# Patient Record
Sex: Female | Born: 1965 | Race: White | Hispanic: No | Marital: Single | Smoking: Never smoker
Health system: Southern US, Community
[De-identification: ages and names within clinical notes are randomized; demographics above are authoritative.]

## PROBLEM LIST (undated history)

## (undated) DIAGNOSIS — K219 Gastro-esophageal reflux disease without esophagitis: Secondary | ICD-10-CM

## (undated) DIAGNOSIS — E669 Obesity, unspecified: Secondary | ICD-10-CM

## (undated) DIAGNOSIS — I10 Essential (primary) hypertension: Secondary | ICD-10-CM

## (undated) DIAGNOSIS — C801 Malignant (primary) neoplasm, unspecified: Secondary | ICD-10-CM

## (undated) DIAGNOSIS — E785 Hyperlipidemia, unspecified: Secondary | ICD-10-CM

## (undated) DIAGNOSIS — K760 Fatty (change of) liver, not elsewhere classified: Secondary | ICD-10-CM

## (undated) DIAGNOSIS — E119 Type 2 diabetes mellitus without complications: Secondary | ICD-10-CM

## (undated) DIAGNOSIS — E7800 Pure hypercholesterolemia, unspecified: Secondary | ICD-10-CM

## (undated) DIAGNOSIS — G5603 Carpal tunnel syndrome, bilateral upper limbs: Secondary | ICD-10-CM

## (undated) DIAGNOSIS — R000 Tachycardia, unspecified: Secondary | ICD-10-CM

## (undated) DIAGNOSIS — I2699 Other pulmonary embolism without acute cor pulmonale: Secondary | ICD-10-CM

## (undated) DIAGNOSIS — G43909 Migraine, unspecified, not intractable, without status migrainosus: Secondary | ICD-10-CM

## (undated) DIAGNOSIS — Z9289 Personal history of other medical treatment: Secondary | ICD-10-CM

## (undated) DIAGNOSIS — T4145XA Adverse effect of unspecified anesthetic, initial encounter: Secondary | ICD-10-CM

## (undated) DIAGNOSIS — F419 Anxiety disorder, unspecified: Secondary | ICD-10-CM

## (undated) DIAGNOSIS — T8859XA Other complications of anesthesia, initial encounter: Secondary | ICD-10-CM

## (undated) HISTORY — DX: Gastro-esophageal reflux disease without esophagitis: K21.9

## (undated) HISTORY — DX: Pure hypercholesterolemia, unspecified: E78.00

## (undated) HISTORY — DX: Carpal tunnel syndrome, bilateral upper limbs: G56.03

## (undated) HISTORY — PX: CARPAL TUNNEL RELEASE: SHX101

## (undated) HISTORY — DX: Personal history of other medical treatment: Z92.89

## (undated) HISTORY — DX: Hyperlipidemia, unspecified: E78.5

## (undated) HISTORY — DX: Essential (primary) hypertension: I10

## (undated) HISTORY — DX: Anxiety disorder, unspecified: F41.9

## (undated) HISTORY — PX: LAPAROSCOPIC HYSTERECTOMY: SHX1926

## (undated) HISTORY — DX: Type 2 diabetes mellitus without complications: E11.9

## (undated) HISTORY — DX: Malignant (primary) neoplasm, unspecified: C80.1

## (undated) HISTORY — DX: Obesity, unspecified: E66.9

## (undated) HISTORY — DX: Fatty (change of) liver, not elsewhere classified: K76.0

## (undated) HISTORY — PX: OTHER SURGICAL HISTORY: SHX169

## (undated) HISTORY — PX: CARDIAC CATHETERIZATION: SHX172

## (undated) HISTORY — DX: Migraine, unspecified, not intractable, without status migrainosus: G43.909

## (undated) SURGERY — Surgical Case
Anesthesia: *Unknown

---

## 1998-09-05 ENCOUNTER — Other Ambulatory Visit: Admission: RE | Admit: 1998-09-05 | Discharge: 1998-09-05 | Payer: Self-pay | Admitting: Obstetrics and Gynecology

## 1999-09-13 ENCOUNTER — Other Ambulatory Visit: Admission: RE | Admit: 1999-09-13 | Discharge: 1999-09-13 | Payer: Self-pay | Admitting: Obstetrics and Gynecology

## 2000-04-14 DIAGNOSIS — I2699 Other pulmonary embolism without acute cor pulmonale: Secondary | ICD-10-CM

## 2000-04-14 HISTORY — DX: Other pulmonary embolism without acute cor pulmonale: I26.99

## 2000-09-15 ENCOUNTER — Encounter: Payer: Self-pay | Admitting: *Deleted

## 2000-09-15 ENCOUNTER — Inpatient Hospital Stay (HOSPITAL_COMMUNITY): Admission: EM | Admit: 2000-09-15 | Discharge: 2000-09-18 | Payer: Self-pay | Admitting: *Deleted

## 2000-09-18 ENCOUNTER — Encounter (INDEPENDENT_AMBULATORY_CARE_PROVIDER_SITE_OTHER): Payer: Self-pay | Admitting: *Deleted

## 2000-09-18 ENCOUNTER — Encounter: Payer: Self-pay | Admitting: Internal Medicine

## 2000-09-30 ENCOUNTER — Encounter: Payer: Self-pay | Admitting: Internal Medicine

## 2000-12-07 ENCOUNTER — Other Ambulatory Visit: Admission: RE | Admit: 2000-12-07 | Discharge: 2000-12-07 | Payer: Self-pay | Admitting: Obstetrics and Gynecology

## 2001-03-14 ENCOUNTER — Encounter: Payer: Self-pay | Admitting: Emergency Medicine

## 2001-03-14 ENCOUNTER — Emergency Department (HOSPITAL_COMMUNITY): Admission: EM | Admit: 2001-03-14 | Discharge: 2001-03-14 | Payer: Self-pay | Admitting: Emergency Medicine

## 2001-11-15 ENCOUNTER — Encounter: Admission: RE | Admit: 2001-11-15 | Discharge: 2002-01-25 | Payer: Self-pay | Admitting: Internal Medicine

## 2001-12-28 ENCOUNTER — Other Ambulatory Visit: Admission: RE | Admit: 2001-12-28 | Discharge: 2001-12-28 | Payer: Self-pay | Admitting: Obstetrics and Gynecology

## 2003-01-03 ENCOUNTER — Other Ambulatory Visit: Admission: RE | Admit: 2003-01-03 | Discharge: 2003-01-03 | Payer: Self-pay | Admitting: Obstetrics and Gynecology

## 2003-05-26 ENCOUNTER — Inpatient Hospital Stay (HOSPITAL_BASED_OUTPATIENT_CLINIC_OR_DEPARTMENT_OTHER): Admission: RE | Admit: 2003-05-26 | Discharge: 2003-05-26 | Payer: Self-pay | Admitting: Cardiology

## 2003-07-13 ENCOUNTER — Encounter (INDEPENDENT_AMBULATORY_CARE_PROVIDER_SITE_OTHER): Payer: Self-pay | Admitting: *Deleted

## 2003-07-13 ENCOUNTER — Encounter: Admission: RE | Admit: 2003-07-13 | Discharge: 2003-07-13 | Payer: Self-pay | Admitting: Internal Medicine

## 2004-04-09 ENCOUNTER — Other Ambulatory Visit: Admission: RE | Admit: 2004-04-09 | Discharge: 2004-04-09 | Payer: Self-pay | Admitting: Obstetrics and Gynecology

## 2004-05-24 ENCOUNTER — Ambulatory Visit (HOSPITAL_BASED_OUTPATIENT_CLINIC_OR_DEPARTMENT_OTHER): Admission: RE | Admit: 2004-05-24 | Discharge: 2004-05-24 | Payer: Self-pay | Admitting: Orthopedic Surgery

## 2004-05-24 ENCOUNTER — Ambulatory Visit (HOSPITAL_COMMUNITY): Admission: RE | Admit: 2004-05-24 | Discharge: 2004-05-24 | Payer: Self-pay | Admitting: Orthopedic Surgery

## 2005-02-06 ENCOUNTER — Ambulatory Visit (HOSPITAL_BASED_OUTPATIENT_CLINIC_OR_DEPARTMENT_OTHER): Admission: RE | Admit: 2005-02-06 | Discharge: 2005-02-06 | Payer: Self-pay | Admitting: Otolaryngology

## 2005-02-09 ENCOUNTER — Ambulatory Visit: Payer: Self-pay | Admitting: Internal Medicine

## 2005-02-19 ENCOUNTER — Ambulatory Visit: Payer: Self-pay | Admitting: Internal Medicine

## 2005-04-18 ENCOUNTER — Other Ambulatory Visit: Admission: RE | Admit: 2005-04-18 | Discharge: 2005-04-18 | Payer: Self-pay | Admitting: Obstetrics and Gynecology

## 2005-10-26 ENCOUNTER — Inpatient Hospital Stay (HOSPITAL_COMMUNITY): Admission: EM | Admit: 2005-10-26 | Discharge: 2005-10-27 | Payer: Self-pay | Admitting: Emergency Medicine

## 2007-04-01 ENCOUNTER — Encounter: Admission: RE | Admit: 2007-04-01 | Discharge: 2007-04-01 | Payer: Self-pay | Admitting: Internal Medicine

## 2007-04-01 ENCOUNTER — Encounter (INDEPENDENT_AMBULATORY_CARE_PROVIDER_SITE_OTHER): Payer: Self-pay | Admitting: *Deleted

## 2007-09-08 ENCOUNTER — Ambulatory Visit: Payer: Self-pay | Admitting: Internal Medicine

## 2007-09-09 ENCOUNTER — Ambulatory Visit: Payer: Self-pay | Admitting: Internal Medicine

## 2007-10-18 ENCOUNTER — Ambulatory Visit: Payer: Self-pay | Admitting: Internal Medicine

## 2007-12-29 ENCOUNTER — Encounter (INDEPENDENT_AMBULATORY_CARE_PROVIDER_SITE_OTHER): Payer: Self-pay | Admitting: Obstetrics and Gynecology

## 2007-12-29 ENCOUNTER — Ambulatory Visit (HOSPITAL_COMMUNITY): Admission: RE | Admit: 2007-12-29 | Discharge: 2007-12-29 | Payer: Self-pay | Admitting: Obstetrics and Gynecology

## 2008-01-12 ENCOUNTER — Ambulatory Visit: Payer: Self-pay | Admitting: Internal Medicine

## 2009-02-08 ENCOUNTER — Ambulatory Visit: Payer: Self-pay | Admitting: Internal Medicine

## 2009-02-08 DIAGNOSIS — I10 Essential (primary) hypertension: Secondary | ICD-10-CM

## 2009-02-08 DIAGNOSIS — E7800 Pure hypercholesterolemia, unspecified: Secondary | ICD-10-CM

## 2009-02-08 DIAGNOSIS — I479 Paroxysmal tachycardia, unspecified: Secondary | ICD-10-CM

## 2009-06-18 ENCOUNTER — Encounter: Admission: RE | Admit: 2009-06-18 | Discharge: 2009-06-18 | Payer: Self-pay | Admitting: Internal Medicine

## 2009-06-18 ENCOUNTER — Encounter (INDEPENDENT_AMBULATORY_CARE_PROVIDER_SITE_OTHER): Payer: Self-pay | Admitting: *Deleted

## 2009-06-19 ENCOUNTER — Telehealth: Payer: Self-pay | Admitting: Internal Medicine

## 2009-06-20 ENCOUNTER — Encounter: Payer: Self-pay | Admitting: Internal Medicine

## 2009-07-09 ENCOUNTER — Ambulatory Visit: Payer: Self-pay | Admitting: Internal Medicine

## 2009-07-09 DIAGNOSIS — K219 Gastro-esophageal reflux disease without esophagitis: Secondary | ICD-10-CM | POA: Insufficient documentation

## 2009-07-09 DIAGNOSIS — E109 Type 1 diabetes mellitus without complications: Secondary | ICD-10-CM | POA: Insufficient documentation

## 2009-07-09 DIAGNOSIS — R1013 Epigastric pain: Secondary | ICD-10-CM

## 2009-07-18 ENCOUNTER — Ambulatory Visit: Payer: Self-pay | Admitting: Internal Medicine

## 2009-07-19 ENCOUNTER — Ambulatory Visit (HOSPITAL_COMMUNITY): Admission: RE | Admit: 2009-07-19 | Discharge: 2009-07-19 | Payer: Self-pay | Admitting: Internal Medicine

## 2009-07-19 ENCOUNTER — Ambulatory Visit: Payer: Self-pay | Admitting: Internal Medicine

## 2009-12-18 ENCOUNTER — Ambulatory Visit (HOSPITAL_COMMUNITY): Admission: RE | Admit: 2009-12-18 | Discharge: 2009-12-22 | Payer: Self-pay | Admitting: Obstetrics and Gynecology

## 2009-12-18 ENCOUNTER — Encounter (INDEPENDENT_AMBULATORY_CARE_PROVIDER_SITE_OTHER): Payer: Self-pay | Admitting: Obstetrics and Gynecology

## 2009-12-18 ENCOUNTER — Ambulatory Visit: Payer: Self-pay | Admitting: Emergency Medicine

## 2009-12-20 ENCOUNTER — Encounter: Payer: Self-pay | Admitting: Emergency Medicine

## 2009-12-20 ENCOUNTER — Ambulatory Visit: Payer: Self-pay | Admitting: Cardiology

## 2010-01-21 ENCOUNTER — Ambulatory Visit: Payer: Self-pay | Admitting: Emergency Medicine

## 2010-01-21 DIAGNOSIS — R0602 Shortness of breath: Secondary | ICD-10-CM

## 2010-04-14 HISTORY — PX: LAPAROSCOPIC HYSTERECTOMY: SHX1926

## 2010-05-05 ENCOUNTER — Encounter: Payer: Self-pay | Admitting: Internal Medicine

## 2010-05-16 NOTE — Assessment & Plan Note (Signed)
Summary: 6 month rov/sl    Visit Type:  Follow-up Referring Provider:  Guerry Bruin, MD  Primary Provider:  Guerry Bruin, MD    History of Present Illness: Jocelyn [name] returns today for followup.  She is a very pleasant young woman with diabetes, inappropriate sinus tachycardia, hypertension, and dizziness. The etiology of her dizziness is unclear but she notes now that it only occurs when she drives her car on busy streets or on the highway.  She denies c/p or sob but does still feel intermittant palpitations associated with chest tightness.  No peripheral edema.  Since I saw her last she has been well.    Current Medications (verified): 1)  Altace 2.5 Mg Caps (Ramipril) .... Once Daily 2)  Protonix 40 Mg Tbec (Pantoprazole Sodium) .... Take 1 By Mouth Once Daily 3)  Toprol Xl 25 Mg Xr24h-Tab (Metoprolol Succinate) .... Take 1 By Mouth Twice Daily 4)  Zoloft 100 Mg Tabs (Sertraline Hcl) .... Take 1 By Mouth Once Daily 5)  Vytorin 10-80 Mg Tabs (Ezetimibe-Simvastatin) .... Take One Tablet By Mouth Daily At Bed Time 6)  Aspirin 81 Mg Tbec (Aspirin) .... Take One Tablet By Mouth Daily 7)  Astelin 137 Mcg/spray Soln (Azelastine Hcl) .... 2 Sprays As Needed 8)  Multivitamins  Tabs (Multiple Vitamin) .... Take 1 By Mouth Once Daily 9)  Carafate 1 Gm Tabs (Sucralfate) .... One Tablet By Mouth Once Daily 10)  Insulin(Pump) .... Use As Directed  Allergies: 1)  ! Sulfa  Past History:  Past Medical History: Last updated: 07/17/2009 DM HTN Innappropriate ST Dyslipidemia Recurrent Chest pain with multiple negative catheterizations. 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    Vital Signs:  Patient profile:   45 year old female Height:      61.5 inches Weight:      167 pounds BMI:     31.16 Pulse rate:   88 / minute BP sitting:   110 / 60  (left arm)  Vitals Entered By: Laurance Flatten CMA (July 18, 2009 9:35 AM) Comments Toporol xl 25mg  1 tab daily 2nd tab if needed.   Physical Exam  General:  Well developed, well nourished, no acute distress. Head:  Normocephalic and atraumatic. Eyes:  PERRLA, no icterus. Mouth:  No deformity or lesions, dentition normal. Neck:  Supple; no masses or thyromegaly. Lungs:  Clear throughout to auscultation. Heart:  Regular rate and rhythm; no murmurs, rubs,  or bruits. Abdomen:  Soft, nontender and nondistended. No masses, hepatosplenomegaly or hernias noted. Normal bowel sounds. Msk:  Symmetrical with no gross deformities. Normal posture. Pulses:  Normal pulses noted. Extremities:  No clubbing, cyanosis, edema or deformities noted. Neurologic:  Alert and  oriented x4;  grossly normal neurologically.   EKG  Procedure date:  07/18/2009  Findings:      Normal sinus rhythm with rate of:  88.  Impression & Recommendations:  Problem # 1:  ESSENTIAL HYPERTENSION, BENIGN (ICD-401.1) Her blood pressure appears to be well controlled.  I will see her back in one year. Her updated medication list for this problem includes:    Altace 2.5 Mg  Caps (Ramipril) ..... Once daily    Toprol Xl 25 Mg Xr24h-tab (Metoprolol succinate) .Marland Kitchen... Take 1 by mouth twice daily    Aspirin 81 Mg Tbec (Aspirin) .Marland Kitchen... 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  She will continue her current meds and add an additional toprol as needed. Her updated  medication list for this problem includes:    Altace 2.5 Mg Caps (Ramipril) ..... Once daily    Toprol Xl 25 Mg Xr24h-tab (Metoprolol succinate) .Marland Kitchen... Take 1 by mouth twice daily    Aspirin 81 Mg Tbec (Aspirin) .Marland Kitchen... Take one tablet by mouth daily  Orders: EKG w/ Interpretation (93000)  Patient Instructions: 1)  Your physician recommends that you schedule a follow-up appointment in: 12 months with Dr Ladona Ridgel

## 2010-05-16 NOTE — Progress Notes (Signed)
Summary: Wants to have an Endo   Phone Note Call from Patient Call back at Home Phone [phone]   Call For: Dr Marina Goodell Reason for Call: Talk to Nurse Summary of Call: Needs to have an Endo. Had an ultrasound of her gallbladder yesterday and her primary care feels she needs to have an Endo. Initial call taken by: Leanor Kail [facility],  June 19, 2009 9:39 AM  Follow-up for Phone Call         Malachi Bonds contacted RE; pt's request for endo and DR.Tisovec's note indicates  that pt. was put on Carafate and if that doesn't help pt. would then be  referred  to GI. Malachi Bonds will send notes for review by Dr.Perry Follow-up by: Teryl Lucy RN,  June 19, 2009 11:45 AM  Additional Follow-up for Phone Call Additional follow up Details #1::        we need direction from the primary provider, not the patient, regarding either a direct referral for endoscopy or an office appointment. Additional Follow-up by: Hilarie Fredrickson MD,  June 20, 2009 10:43 AM    Additional Follow-up for Phone Call Additional follow up Details #2::     Dr.Tisovec's nurse contacted and he recoomends pt. be seen in office first so given appt. for 07/09/2009 at 8:30 am. Follow-up by: Teryl Lucy RN,  June 20, 2009 11:14 AM

## 2010-05-16 NOTE — Procedures (Signed)
Summary: EGD   EGD  Procedure date:  09/30/2000  Findings:      Findings: Normal  Location: [facility]   Patient Name: Jocelyn [name], Jocelyn [name] MRN:  Procedure Procedures: Panendoscopy (EGD) CPT: 43235.  Personnel: Endoscopist: Wilhemina Bonito. Marina Goodell, MD.  Referred By: Pearletha Furl Jacky Kindle, MD.  Exam Location: Exam performed in Outpatient Clinic. Outpatient  Patient Consent: Procedure, Alternatives, Risks and Benefits discussed, consent obtained, from patient.  Indications Symptoms: Chest Pain.  History  Pre-Exam Physical: Performed Sep 30, 2000  Cardio-pulmonary exam, HEENT exam, Abdominal exam, Extremity exam, Neurological exam, Mental status exam WNL.  Exam Exam Info: Maximum depth of insertion Duodenum, intended Duodenum. Patient position: on left side. Vocal cords visualized. Gastric retroflexion performed. Images taken. ASA Classification: II. Tolerance: excellent.  Sedation Meds: Patient assessed and found to be appropriate for moderate (conscious) sedation. Fentanyl 50 mcg. Versed 5 mg. Cetacaine Spray  Monitoring: BP and pulse monitoring done. Oximetry used. Supplemental O2 given  Findings Normal: Proximal Esophagus to Duodenal 2nd Portion.    Comments: NORMAL EXAM. NO CAUSE FOR PAIN FOUND. Assessment Normal examination.  Events  Unplanned Intervention: No unplanned interventions were required.  Unplanned Events: There were no complications. Plans Comments: Cont Nexium empirically for 4-6 weeks by chance sx possibly related to reflux Disposition: After procedure patient sent to recovery. After recovery patient sent home.  Comments: Follow up w/ Dr. Criss Alvine.  This report was created from the original endoscopy report, which was reviewed and signed by the above listed endoscopist.   cc:  Geoffry Paradise, MD      Coralee North, MD      Sandrea Hughs, MD      Graceann Congress, MD

## 2010-05-16 NOTE — Assessment & Plan Note (Signed)
Summary: dyspnea, abnormal CXR   Visit Type:  Hospital Follow-up Copy to:  Guerry Bruin, MD  Primary Provider/Referring Provider:  Guerry Bruin, MD   CC:  Hospital follow-up...no complaints today.  History of Present Illness: 45 yo never smoker, hx PE, HTN, DM, CAD, migraines. She was admitted 9/6 to 9/10 at [facility] for laproscopic hysterectomy. I saw her in consultation there for post-op dyspnea, with bilateral infiltrates.   During that hosp TTE performed - intact LV fxn. CXR was improving at time of discharge. Pct was negative, no clinical evidence PNA.   She presents now feeling very well, no SOB or other worrisome symptoms.   Preventive Screening-Counseling & Management  Alcohol-Tobacco     Smoking Status: never  Current Medications (verified): 1)  Altace 2.5 Mg Caps (Ramipril) .... Once Daily 2)  Protonix 40 Mg Tbec (Pantoprazole Sodium) .Marland Kitchen.. 1 By Mouth Two Times A Day 3)  Toprol Xl 25 Mg Xr24h-Tab (Metoprolol Succinate) .... Take 1 By Mouth Twice Daily 4)  Zoloft 100 Mg Tabs (Sertraline Hcl) .... Take 1 By Mouth Once Daily 5)  Vytorin 10-80 Mg Tabs (Ezetimibe-Simvastatin) .... Take One Tablet By Mouth Daily At Bed Time 6)  Aspirin 81 Mg Tbec (Aspirin) .... Take One Tablet By Mouth Daily 7)  Astelin 137 Mcg/spray Soln (Azelastine Hcl) .... 2 Sprays As Needed 8)  Multivitamins  Tabs (Multiple Vitamin) .... Take 1 By Mouth Once Daily 9)  Carafate 1 Gm Tabs (Sucralfate) .... One Tablet By Mouth Once Daily As Needed 10)  Insulin(Pump) .... Use As Directed 11)  Vitamin B-12 250 Mcg Tabs (Cyanocobalamin) .... 3 By Mouth Daily 12)  Vitamin C 500 Mg Tabs (Ascorbic Acid) .Marland Kitchen.. 1 By Mouth Daily 13)  Calcium 500 Mg Tabs (Calcium) .... 2 By Mouth Daily  Allergies (verified): 1)  ! Sulfa  Past History:  Past Medical History: DM HTN Innappropriate ST Dyslipidemia Recurrent Chest pain with multiple negative catheterizations. Anxiety Disorder Depression OSA Fatty Liver  Disease DVT GERD  Past Surgical History: Carpal Tunnel Surgery---Right  Fibroid Tumor Uterus Surgery  Laproscopic Hysterectomy 12/9009  Family History: Reviewed history from 07/09/2009 and no changes required. No FH of Colon Cancer:  Social History: Reviewed history from 07/09/2009 and no changes required. 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  Alcohol Use - no Illicit Drug Use - no  Vital Signs:  Patient profile:   45 year old female Height:      61.5 inches (156.21 cm) Weight:      164.50 pounds (74.77 kg) BMI:     30.69 O2 Sat:      98 % on Room air Temp:     98.0 degrees F (36.67 degrees C) oral Pulse rate:   102 / minute BP sitting:   118 / 80  (left arm) Cuff size:   regular  Vitals Entered By: Michel Bickers CMA (January 21, 2010 9:19 AM)  O2 Sat at Rest %:  98 O2 Flow:  Room air CC: Hospital follow-up...no complaints today Is Patient Diabetic? Yes Did you bring your meter with you today? No Comments Medications reviewed with patient Daytime phone verified. Michel Bickers CMA  January 21, 2010 9:20 AM   Physical Exam  General:  normal appearance, healthy appearing, and obese.   Head:  normocephalic and atraumatic Eyes:  conjunctiva and sclera clear Nose:  no deformity, discharge, inflammation, or lesions Neck:  no masses, thyromegaly, or abnormal cervical nodes Lungs:  clear bilaterally to auscultation Heart:  regular rate and rhythm, S1, S2 without murmurs, rubs, gallops, or clicks Abdomen:  not examined Neurologic:  non-focal Psych:  alert and cooperative; normal mood and affect; normal attention span and concentration   Impression & Recommendations:  Problem # 1:  DYSPNEA (ICD-786.05)  With infiltrates post-op, now clinically resolved, normal exam. Suspect this was negative pressure pulm edema vs aspiration pneumonitis compounded by atx. BC/BS has denied payment of procalcitonin in house - I disagree with this and will help  her attempt an appeal. Otherwise nothing else to do at this point. She will contact me if she has any difficulty or new symptoms.   Orders: New Patient Level III (52841)  Medications Added to Medication List This Visit: 1)  Protonix 40 Mg Tbec (Pantoprazole sodium) .Marland Kitchen.. 1 by mouth two times a day 2)  Carafate 1 Gm Tabs (Sucralfate) .... One tablet by mouth once daily as needed 3)  Vitamin B-12 250 Mcg Tabs (Cyanocobalamin) .... 3 by mouth daily 4)  Vitamin C 500 Mg Tabs (Ascorbic acid) .Marland Kitchen.. 1 by mouth daily 5)  Calcium 500 Mg Tabs (Calcium) .... 2 by mouth daily  Patient Instructions: 1)  We will discuss your procalcitonin test with your insurance carrier 2)  Follow up with Dr Delton Coombes if you develop shortness of breath or any new problems.    Immunization History:  Influenza Immunization History:    Influenza:  historical (01/12/2009)  Pneumovax Immunization History:    Pneumovax:  historical (07/13/2005)

## 2010-05-16 NOTE — Letter (Signed)
Summary: New Patient letter  [facility]  [address] [place], [place] [address]   Phone: [phone]  Fax: [phone]       06/20/2009 MRN: [id number]  [facility] [address] [place], [place]  [address]  Dear [name],  Welcome to the Gastroenterology Division at [facility].    You are scheduled to see Dr.   Yancey Flemings on MONDAY-07/09/2009 at 8:30 a.m. on  the 3rd floor at [facility], [address]  We ask that you try to arrive at our office 15 minutes prior to your appointment time to allow for check-in.  We would like you to complete the enclosed self-administered evaluation form prior to your visit and bring it with you on the day of your appointment.  We will review it with you.  Also, please bring a complete list of all your medications or, if you prefer, bring the medication bottles and we will list them.  Please bring your insurance card so that we may make a copy of it.  If your insurance requires a referral to see a specialist, please bring your referral form from your primary care physician.  Co-payments are due at the time of your visit and may be paid by cash, check or credit card.     Your office visit will consist of a consult with your physician (includes a physical exam), any laboratory testing he/she may order, scheduling of any necessary diagnostic testing (e.g. x-ray, ultrasound, CT-scan), and scheduling of a procedure (e.g. Endoscopy, Colonoscopy) if required.  Please allow enough time on your schedule to allow for any/all of these possibilities.    If you cannot keep your appointment, please call [phone] to cancel or reschedule prior to your appointment date.  This allows [place] the opportunity to schedule an appointment for another patient in need of care.  If you do not cancel or reschedule by 5 p.m. the business day prior to your appointment date, you will be charged a $50.00 late cancellation/no-show fee.    Thank you for  choosing [facility] for your medical needs.  We appreciate the opportunity to care for you.  Please visit [place] at our website  to learn more about our practice.                     Sincerely,                                                             The Gastroenterology Division

## 2010-05-16 NOTE — Letter (Signed)
Summary: Diabetic Instructions  [facility]  [address] [place], [place] [address]   Phone: [phone]  Fax: [phone]    [facility] [birth year] MRN: [id number]   _  _   ORAL DIABETIC MEDICATION INSTRUCTIONS  The day before your procedure:   Take your diabetic pill as you do normally  The day of your procedure:   Do not take your diabetic pill    We will check your blood sugar levels during the admission process and again in Recovery before discharging you home  ________________________________________________________________________  _  _   INSULIN (LONG ACTING) MEDICATION INSTRUCTIONS (Lantus, NPH, 70/30, Humulin, Novolin-N)   The day before your procedure:   Take  your regular evening dose    The day of your procedure:   Do not take your morning dose    _  _   INSULIN (SHORT ACTING) MEDICATION INSTRUCTIONS (Regular, Humulog, Novolog)   The day before your procedure:   Do not take your evening dose   The day of your procedure:   Do not take your morning dose   x   INSULIN PUMP MEDICATION INSTRUCTIONS  Adjust your insulin pump accordingly per Dr. Marina Goodell.

## 2010-05-16 NOTE — Letter (Signed)
Summary: EGD Instructions  [facility]  [address] [place], [place] [address]   Phone: [phone]  Fax: [phone]       [facility]    1965/10/31    MRN: [id number]       Procedure Day /Date:THURSDAY, 07/19/09     Arrival Time: 2:00 PM     Procedure Time:3:00 PM     Location of Procedure:                    [facility] (4th Floor)   PREPARATION FOR ENDOSCOPY   On THURSDAY THE DAY OF THE PROCEDURE:  1.   No solid foods, milk or milk products are allowed after midnight the night before your procedure.  2.   Do not drink anything colored red or purple.  Avoid juices with pulp.  No orange juice.  3.  You may drink clear liquids until1:00 PM which is 2 hours before your procedure.                                                                                                CLEAR LIQUIDS INCLUDE: Water Jello Ice Popsicles Tea (sugar ok, no milk/cream) Powdered fruit flavored drinks Coffee (sugar ok, no milk/cream) Gatorade Juice: apple, white grape, white cranberry  Lemonade Clear bullion, consomm, broth Carbonated beverages (any kind) Strained chicken noodle soup Hard Candy   MEDICATION INSTRUCTIONS  Unless otherwise instructed, you should take regular prescription medications with a small sip of water as early as possible the morning of your procedure.  Diabetic patients - see separate instructions.             OTHER INSTRUCTIONS  You will need a responsible adult at least 46 years of age to accompany you and drive you home.   This person must remain in the waiting room during your procedure.  Wear loose fitting clothing that is easily removed.  Leave jewelry and other valuables at home.  However, you may wish to bring a book to read or an iPod/MP3 player to listen to music as you wait for your procedure to start.  Remove all body piercing jewelry and leave at home.  Total time from sign-in until discharge is  approximately 2-3 hours.  You should go home directly after your procedure and rest.  You can resume normal activities the day after your procedure.  The day of your procedure you should not:   Drive   Make legal decisions   Operate machinery   Drink alcohol   Return to work  You will receive specific instructions about eating, activities and medications before you leave.    The above instructions have been reviewed and explained to me by   _______________________    I fully understand and can verbalize these instructions _____________________________ Date _________

## 2010-05-16 NOTE — Assessment & Plan Note (Signed)
Summary: abd. pain-ruq,nausea    History of Present Illness Visit Type: consult  Primary GI MD: Yancey Flemings MD Primary Provider: Guerry Bruin, MD  Requesting Provider: Guerry Bruin, MD  Chief Complaint: RUQ abd pain, nausea, loss of appetite, belching, bloating, acid reflux, and heartburn  History of Present Illness:   45 year old white female with hypertension, dyslipidemia, long-standing type 1 diabetes mellitus now on insulin pump, GERD, and anxiety/depression. She is sent today regarding recent problems with abdominal pain. She reports, about one month ago, developing epigastric pain that radiated into the back. The pain was described as heaviness and was fairly constant over the course of about 2 weeks. This did not interfere with her sleep or activities. No obvious exacerbating factors. She placed herself on a BRATT diet and thinks that this may have helped. She saw her primary care physician and was evaluated. Review of outside laboratories from March 4 shows normal CBC, comprehensive metabolic panel, amylase, and lipase. Additionally, she underwent abdominal ultrasound on March 7. This revealed a fatty liver, but was otherwise normal, including the gallbladder. She remains on Protonix long term for GERD. This does not remind her of GERD symptoms. She did undergo upper endoscopy in 2002 for chest pain. This was normal. She has been on Carafate. Other complaints include belching and bloating. At the time that she was having discomfort, she noticed a change in her bowel habits with somewhat loose stools. This has resolved. There was no GI bleeding or weight loss. She is concerned because of a family history of difficult to diagnose gallbladder disease eventually culminating in surgery. Her multiple chronic medical problems are stable.   GI Review of Systems    Reports abdominal pain, acid reflux, belching, bloating, heartburn, loss of appetite, and  nausea.     Location of  Abdominal pain:  epigastric area.    Denies chest pain, dysphagia with liquids, dysphagia with solids, vomiting, vomiting blood, weight loss, and  weight gain.      Reports change in bowel habits and  liver problems.     Denies anal fissure, black tarry stools, constipation, diarrhea, diverticulosis, fecal incontinence, heme positive stool, hemorrhoids, irritable bowel syndrome, jaundice, light color stool, rectal bleeding, and  rectal pain.    Current Medications (verified): 1)  Altace 2.5 Mg Caps (Ramipril) .... Once Daily 2)  Protonix 40 Mg Tbec (Pantoprazole Sodium) .... Take 1 By Mouth Once Daily 3)  Toprol Xl 25 Mg Xr24h-Tab (Metoprolol Succinate) .... Take 1 By Mouth Twice Daily 4)  Zoloft 100 Mg Tabs (Sertraline Hcl) .... Take 1 By Mouth Once Daily 5)  Vytorin 10-80 Mg Tabs (Ezetimibe-Simvastatin) .... Take One Tablet By Mouth Daily At Bed Time 6)  Aspirin 81 Mg Tbec (Aspirin) .... Take One Tablet By Mouth Daily 7)  Astelin 137 Mcg/spray Soln (Azelastine Hcl) .... 2 Sprays As Needed 8)  Multivitamins  Tabs (Multiple Vitamin) .... Take 1 By Mouth Once Daily 9)  Carafate 1 Gm Tabs (Sucralfate) .... One Tablet By Mouth Once Daily 10)  Insulin(Pump) .... Use As Directed  Allergies (verified): 1)  ! Sulfa  Past History:  Past Medical History: DM HTN Innappropriate ST Dyslipidemia Recurrent Chest pain with multiple negative catheterizations. Anxiety Disorder Depression OSA Fatty Liver Disease DVT GERD  Past Surgical History: Carpal Tunnel Surgery---Right  Fibroid Tumor Uterus Surgery   Family History: No FH of Colon Cancer:  Social History: 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  Alcohol Use - no Illicit Drug  Use - no Smoking Status:  never Drug Use:  no  Review of Systems       The patient complains of allergy/sinus, cough, headaches-new, and night sweats.  The patient denies anemia, anxiety-new, arthritis/joint pain, back pain, blood in urine, breast  changes/lumps, change in vision, confusion, coughing up blood, depression-new, fainting, fatigue, fever, hearing problems, heart murmur, heart rhythm changes, itching, menstrual pain, muscle pains/cramps, nosebleeds, pregnancy symptoms, shortness of breath, skin rash, sleeping problems, sore throat, swelling of feet/legs, swollen lymph glands, thirst - excessive , urination - excessive , urination changes/pain, urine leakage, vision changes, and voice change.    Vital Signs:  Patient profile:   45 year old female Height:      61.5 inches Weight:      165 pounds BMI:     30.78 BSA:     1.75 Pulse rate:   88 / minute Pulse rhythm:   regular BP sitting:   128 / 80  (left arm) Cuff size:   regular  Vitals Entered By: Ok Anis CMA (July 09, 2009 8:34 AM)  Physical Exam  General:  Well developed, well nourished, no acute distress. Head:  Normocephalic and atraumatic. Eyes:  PERRLA, no icterus. Ears:  Normal auditory acuity. Nose:  No deformity, discharge,  or lesions. Mouth:  No deformity or lesions, dentition normal. Neck:  Supple; no masses or thyromegaly. Lungs:  Clear throughout to auscultation. Heart:  Regular rate and rhythm; no murmurs, rubs,  or bruits. Abdomen:  Soft, nontender and nondistended. No masses, hepatosplenomegaly or hernias noted. Normal bowel sounds. Msk:  Symmetrical with no gross deformities. Normal posture. Pulses:  Normal pulses noted. Extremities:  No clubbing, cyanosis, edema or deformities noted. Neurologic:  Alert and  oriented x4;  grossly normal neurologically. Skin:  Intact without significant lesions or rashes. Cervical Nodes:  No significant cervical adenopathy.no supraclavicular adenopathy Psych:  Alert and cooperative. Normal mood and affect.   Impression & Recommendations:  Problem # 1:  ABDOMINAL PAIN-EPIGASTRIC (ICD-49.51) 45 year old with multiple medical problems who presents with a two-week history of epigastric pain as described.  Negative labs and ultrasound as noted. Etiology uncertain. She may have had intestinal spasm with associated loose stools. Acalculous Occult gallbladder disease and diabetic is another possibility. Ulcer disease is also possible, though would seem less likely given chronic PPI use.  Plan: #1. HIDA scan to rule out gallbladder dysfunction in a diabetic #2. Upper endoscopy. The nature of the procedure as well as the risks, benefits, and alternatives have been reviewed. She understood and agreed to proceed #3. The patient will adjust her insulin pump to avoid hypoglycemia. We will monitor her blood sugar immediately before and after her procedure  Problem # 2:  GERD (ICD-530.81) classic GERD symptoms controlled with PPI. Not certain if her problems with pain may have also represented breakthrough GERD equivalent. She may have had some benefit from Carafate or this was simply temporal relationship.  Plan: \#1. Continue protonix #2. Reflux precautions  Problem # 3:  DIAB W/O COMP TYPE I [JUV] NOT STATED UNCNTRL (ICD-250.01) insulin adjustment preprocedure as noted  Other Orders: HIDA CCK (HIDA CCK) EGD (EGD)  Patient Instructions: 1)  HIDA Scan with CCK [place] 07/19/09 11:00 am arrive at 10:30 am. 2)  Nothing to eat or drink 4 hours prior. 3)  EGD [facility] 07/19/09 3:00 pm arrive at 2:00 pm 4)  Upper Endoscopy brochure given.  5)  You are to adjust your insulin pump as needed. 6)  Copy  sent to : Guerry Bruin, MD 7)  The medication list was reviewed and reconciled.  All changed / newly prescribed medications were explained.  A complete medication list was provided to the patient / caregiver. 8)  copy given to patient  Milford Cage [facility]  July 09, 2009 9:26 AM

## 2010-05-16 NOTE — Procedures (Signed)
Summary: Upper Endoscopy  Patient: [name] Note: All result statuses are Final unless otherwise noted.  Tests: (1) Upper Endoscopy (EGD)   EGD Upper Endoscopy       DONE     [facility]     [address]     [place], [place]  [address]           ENDOSCOPY PROCEDURE REPORT           PATIENT:  Jocelyn [name], Jocelyn [name]  MR#:  [id number]     BIRTHDATE:  [birth year], 43 yrs. old  GENDER:  female           ENDOSCOPIST:  Wilhemina Bonito. Eda Keys, MD     Referred by:  Guerry Bruin, M.D.           PROCEDURE DATE:  07/19/2009     PROCEDURE:  EGD, diagnostic     ASA CLASS:  Class III     INDICATIONS:  abdominal pain           MEDICATIONS:   Fentanyl 50 mcg IV, Versed 5 mg IV     TOPICAL ANESTHETIC:  Exactacain Spray           DESCRIPTION OF PROCEDURE:   After the risks benefits and     alternatives of the procedure were thoroughly explained, informed     consent was obtained.  The LB GIF-H180 [id number] endoscope was     introduced through the mouth and advanced to the third portion of     the duodenum, without limitations.  The instrument was slowly     withdrawn as the mucosa was fully examined.     <<PROCEDUREIMAGES>>           The upper, middle, and distal third of the esophagus were     carefully inspected and no abnormalities were noted. The z-line     was well seen at the GEJ. The endoscope was pushed into the fundus     which was normal including a retroflexed view. The antrum,gastric     body, first and second part of the duodenum were unremarkable.     However, the gastric mucosa was atrophic and mild antral erythema     seen.    Retroflexed views revealed no abnormalities.    The scope     was then withdrawn from the patient and the procedure completed.           COMPLICATIONS:  None           ENDOSCOPIC IMPRESSION:     1) Essentially Normal EGD     2) Atrophic gastric mucosa     3) Gerd           RECOMMENDATIONS:     1) Continue current medications     2) Follow up if  severe pain returns           ______________________________     Wilhemina Bonito. Eda Keys, MD           CC:  The Patient, Guerry Bruin, MD           n.     Rosalie Doctor:   Wilhemina Bonito. Eda Keys at 07/19/2009 03:41 PM           [name], [name], [id number]  Note: An exclamation mark (!) indicates a result that was not dispersed into the flowsheet. Document Creation Date: 07/19/2009 3:42 PM _______________________________________________________________________  (1) Order result status: Final Collection or observation date-time: 07/19/2009 15:33  Requested date-time:  Receipt date-time:  Reported date-time:  Referring Physician:   Ordering Physician: Fransico Setters [phone]) Specimen Source:  Source: Launa Grill Order Number: [id number] Lab site:

## 2010-06-27 LAB — URINALYSIS, MICROSCOPIC ONLY
Glucose, UA: NEGATIVE mg/dL
Hgb urine dipstick: NEGATIVE
Ketones, ur: NEGATIVE mg/dL
Protein, ur: NEGATIVE mg/dL

## 2010-06-27 LAB — DIFFERENTIAL
Basophils Absolute: 0 10*3/uL (ref 0.0–0.1)
Basophils Relative: 0 % (ref 0–1)
Eosinophils Absolute: 0.1 10*3/uL (ref 0.0–0.7)
Lymphocytes Relative: 11 % — ABNORMAL LOW (ref 12–46)
Monocytes Absolute: 0.8 10*3/uL (ref 0.1–1.0)
Monocytes Relative: 6 % (ref 3–12)
Neutro Abs: 8.1 10*3/uL — ABNORMAL HIGH (ref 1.7–7.7)
Neutrophils Relative %: 83 % — ABNORMAL HIGH (ref 43–77)

## 2010-06-27 LAB — GLUCOSE, CAPILLARY
Glucose-Capillary: 123 mg/dL — ABNORMAL HIGH (ref 70–99)
Glucose-Capillary: 132 mg/dL — ABNORMAL HIGH (ref 70–99)
Glucose-Capillary: 152 mg/dL — ABNORMAL HIGH (ref 70–99)
Glucose-Capillary: 162 mg/dL — ABNORMAL HIGH (ref 70–99)
Glucose-Capillary: 178 mg/dL — ABNORMAL HIGH (ref 70–99)
Glucose-Capillary: 200 mg/dL — ABNORMAL HIGH (ref 70–99)
Glucose-Capillary: 218 mg/dL — ABNORMAL HIGH (ref 70–99)
Glucose-Capillary: 219 mg/dL — ABNORMAL HIGH (ref 70–99)
Glucose-Capillary: 221 mg/dL — ABNORMAL HIGH (ref 70–99)
Glucose-Capillary: 235 mg/dL — ABNORMAL HIGH (ref 70–99)

## 2010-06-27 LAB — CBC
HCT: 37.6 % (ref 36.0–46.0)
Hemoglobin: 10.2 g/dL — ABNORMAL LOW (ref 12.0–15.0)
Hemoglobin: 10.3 g/dL — ABNORMAL LOW (ref 12.0–15.0)
Hemoglobin: 9.6 g/dL — ABNORMAL LOW (ref 12.0–15.0)
MCH: 31.2 pg (ref 26.0–34.0)
MCH: 31.2 pg (ref 26.0–34.0)
MCHC: 34.8 g/dL (ref 30.0–36.0)
MCV: 89.8 fL (ref 78.0–100.0)
MCV: 90.1 fL (ref 78.0–100.0)
Platelets: 228 10*3/uL (ref 150–400)
RBC: 3.08 MIL/uL — ABNORMAL LOW (ref 3.87–5.11)
RBC: 3.31 MIL/uL — ABNORMAL LOW (ref 3.87–5.11)
RBC: 4.18 MIL/uL (ref 3.87–5.11)
WBC: 16.8 10*3/uL — ABNORMAL HIGH (ref 4.0–10.5)
WBC: 5.4 10*3/uL (ref 4.0–10.5)

## 2010-06-27 LAB — COMPREHENSIVE METABOLIC PANEL
ALT: 15 U/L (ref 0–35)
AST: 16 U/L (ref 0–37)
AST: 20 U/L (ref 0–37)
Albumin: 2.7 g/dL — ABNORMAL LOW (ref 3.5–5.2)
Alkaline Phosphatase: 39 U/L (ref 39–117)
BUN: 7 mg/dL (ref 6–23)
CO2: 34 mEq/L — ABNORMAL HIGH (ref 19–32)
Calcium: 8.7 mg/dL (ref 8.4–10.5)
Chloride: 95 mEq/L — ABNORMAL LOW (ref 96–112)
Chloride: 99 mEq/L (ref 96–112)
Creatinine, Ser: 0.79 mg/dL (ref 0.4–1.2)
GFR calc Af Amer: >60 mL/min (ref >60)
GFR calc non Af Amer: >60 mL/min (ref >60)
GFR calc non Af Amer: >60 mL/min (ref >60)
Glucose, Bld: 146 mg/dL — ABNORMAL HIGH (ref 70–99)
Potassium: 3.8 mEq/L (ref 3.5–5.1)
Potassium: 4.3 mEq/L (ref 3.5–5.1)
Sodium: 136 mEq/L (ref 135–145)
Total Bilirubin: 0.3 mg/dL (ref 0.3–1.2)
Total Bilirubin: 0.6 mg/dL (ref 0.3–1.2)
Total Protein: 7.2 g/dL (ref 6.0–8.3)

## 2010-06-27 LAB — URINE CULTURE
Colony Count: NO GROWTH
Culture  Setup Time: 201109081742

## 2010-06-27 LAB — CULTURE, BLOOD (ROUTINE X 2)
Culture: NO GROWTH
Culture: NO GROWTH

## 2010-06-27 LAB — TROPONIN I: Troponin I: 0.03 ng/mL (ref 0.00–0.06)

## 2010-06-27 LAB — BASIC METABOLIC PANEL
BUN: 5 mg/dL — ABNORMAL LOW (ref 6–23)
BUN: 8 mg/dL (ref 6–23)
CO2: 31 mEq/L (ref 19–32)
Chloride: 100 mEq/L (ref 96–112)
Chloride: 98 mEq/L (ref 96–112)
Creatinine, Ser: 0.68 mg/dL (ref 0.4–1.2)
GFR calc Af Amer: >60 mL/min (ref >60)
Glucose, Bld: 121 mg/dL — ABNORMAL HIGH (ref 70–99)
Glucose, Bld: 272 mg/dL — ABNORMAL HIGH (ref 70–99)

## 2010-06-27 LAB — SURGICAL PCR SCREEN
MRSA, PCR: NEGATIVE
Staphylococcus aureus: NEGATIVE

## 2010-06-27 LAB — CK TOTAL AND CKMB (NOT AT ARMC)
CK, MB: 6 ng/mL — ABNORMAL HIGH (ref 0.3–4.0)
Relative Index: 2.1 (ref 0.0–2.5)

## 2010-06-27 LAB — PROCALCITONIN: Procalcitonin: 1.59 ng/mL

## 2010-07-03 ENCOUNTER — Other Ambulatory Visit: Payer: Self-pay | Admitting: *Deleted

## 2010-07-03 DIAGNOSIS — I10 Essential (primary) hypertension: Secondary | ICD-10-CM

## 2010-07-03 MED ORDER — RAMIPRIL 2.5 MG PO CAPS: 2.5000 mg | ORAL_CAPSULE | Freq: Every day | ORAL | Status: DC

## 2010-07-31 ENCOUNTER — Encounter: Payer: Self-pay | Admitting: Internal Medicine

## 2010-08-01 ENCOUNTER — Ambulatory Visit (INDEPENDENT_AMBULATORY_CARE_PROVIDER_SITE_OTHER): Payer: BC Managed Care – PPO | Admitting: Internal Medicine

## 2010-08-01 ENCOUNTER — Encounter: Payer: Self-pay | Admitting: Internal Medicine

## 2010-08-01 VITALS — BP 110/60 | HR 88 | Ht 61.0 in | Wt 168.0 lb

## 2010-08-01 DIAGNOSIS — R0602 Shortness of breath: Secondary | ICD-10-CM

## 2010-08-01 DIAGNOSIS — R002 Palpitations: Secondary | ICD-10-CM

## 2010-08-01 DIAGNOSIS — I479 Paroxysmal tachycardia, unspecified: Secondary | ICD-10-CM

## 2010-08-01 DIAGNOSIS — I10 Essential (primary) hypertension: Secondary | ICD-10-CM

## 2010-08-01 NOTE — Assessment & Plan Note (Signed)
She has multiple cardiac risk factors including long-standing diabetes. Her dyspnea may be a bit worse and she has atypical chest discomfort. I have asked her to undergo a regular treadmill stress test.

## 2010-08-01 NOTE — Assessment & Plan Note (Signed)
Her palpitations are fairly well controlled. She'll continue a strategy of exercise and beta blockers.

## 2010-08-01 NOTE — Assessment & Plan Note (Signed)
Her blood pressure is well controlled. She'll continue her medications. She will maintain a low-sodium diet.

## 2010-08-01 NOTE — Progress Notes (Signed)
HPI Jocelyn [name] returns today for followup. She is a pleasant 45 year old woman with long-standing diabetes, hypertension, and palpitations. She has been diagnosed with autonomic dysfunction and previously had a long term problem with resting sinus tachycardia. The patient has improved over the years with this with the strategy of exercise and beta blocker therapy. She admits that her diabetes has not been particularly well controlled with hemoglobin A1c of 8. She is followed by Dr. Wylene Simmer for this. She's been bothered most recently by problems with her gallbladder but also notes some atypical chest discomfort. She still has occasional palpitations. She has had no syncope or near-syncope. Allergies  Allergen Reactions  . Sulfonamide Derivatives     REACTION: GI distress     Current Outpatient Prescriptions  Medication Sig Dispense Refill  . Ascorbic Acid (VITAMIN C) 500 MG tablet Take 500 mg by mouth daily.        Marland Kitchen aspirin 81 MG tablet Take 81 mg by mouth daily.        Marland Kitchen azelastine (ASTELIN) 137 MCG/SPRAY nasal spray 2 sprays by Nasal route 2 (two) times daily as needed. Use in each nostril as directed       . Calcium Carbonate (CALCIUM 500 PO) Take 2 tablets by mouth daily.        . Cyanocobalamin (VITAMIN B 12) 250 MCG LOZG Take 3 tablets by mouth daily.        Marland Kitchen ezetimibe-simvastatin (VYTORIN) 10-80 MG per tablet Take 1 tablet by mouth at bedtime.        . Insulin Infusion Pump KIT as directed.        . metoprolol succinate (TOPROL-XL) 25 MG 24 hr tablet Take 25 mg by mouth 2 (two) times daily.        . Multiple Vitamin (MULTIVITAMIN) tablet Take 1 tablet by mouth daily.        . pantoprazole (PROTONIX) 40 MG tablet Take 40 mg by mouth 2 (two) times daily.        . ramipril (ALTACE) 2.5 MG capsule Take 1 capsule (2.5 mg total) by mouth daily.  30 capsule  11  . sertraline (ZOLOFT) 100 MG tablet Take 100 mg by mouth daily.        . sucralfate (CARAFATE) 1 G tablet Take 1 g by mouth daily as  needed.           Past Medical History  Diagnosis Date  . DM (diabetes mellitus)   . HTN (hypertension)   . Dyslipidemia   . Chest pain     RECURRENT WITH NEGITIVE CATHERIZATIONS  . Anxiety   . Depression   . OSA (obstructive sleep apnea)   . Fatty liver   . DVT (deep venous thrombosis)   . GERD (gastroesophageal reflux disease)     ROS:   All systems reviewed and negative except as noted in the HPI.   Past Surgical History  Procedure Date  . Carpal tunnel release   . Fibroid tumor     UTERUS SURGERY  . Laparoscopic hysterectomy      No family history on file.   History   Social History  . Marital Status: Single    Spouse Name: N/A    Number of Children: N/A  . Years of Education: N/A   Occupational History  . Not on file.   Social History Main Topics  . Smoking status: Never Smoker   . Smokeless tobacco: Not on file  . Alcohol Use: No  . Drug  Use: No  . Sexually Active: Not on file   Other Topics Concern  . Not on file   Social History Narrative  . No narrative on file     BP 110/60  Pulse 88  Ht 5\' 1"  (1.549 m)  Wt 168 lb (76.204 kg)  BMI 31.74 kg/m2  Physical Exam:  Well appearing NAD. obese HEENT: Unremarkable Neck:  No JVD, no thyromegally Lymphatics:  No adenopathy Back:  No CVA tenderness Lungs:  Clear HEART:  Regular rate rhythm, no murmurs, no rubs, no clicks Abd:  Flat, positive bowel sounds, no organomegally, no rebound, no guarding Ext:  2 plus pulses, no edema, no cyanosis, no clubbing Skin:  No rashes no nodules Neuro:  CN II through XII intact, motor grossly intact  EKG Normal sinus rhythm at 88 beats per minute.  Assess/Plan:

## 2010-08-01 NOTE — Patient Instructions (Signed)
Your physician has requested that you have an exercise tolerance test. For further information please visit [email or web address] Please also follow instruction sheet, as given.  Your physician wants you to follow-up in: 12 months with Dr Ladona Ridgel. You will receive a reminder letter in the mail two months in advance. If you don't receive a letter, please call our office to schedule the follow-up appointment.

## 2010-08-26 ENCOUNTER — Ambulatory Visit (INDEPENDENT_AMBULATORY_CARE_PROVIDER_SITE_OTHER): Payer: BC Managed Care – PPO | Admitting: Internal Medicine

## 2010-08-26 ENCOUNTER — Encounter: Payer: Self-pay | Admitting: Internal Medicine

## 2010-08-26 VITALS — BP 124/68 | HR 96 | Ht 61.0 in | Wt 169.0 lb

## 2010-08-26 DIAGNOSIS — K219 Gastro-esophageal reflux disease without esophagitis: Secondary | ICD-10-CM

## 2010-08-26 DIAGNOSIS — R1013 Epigastric pain: Secondary | ICD-10-CM

## 2010-08-26 NOTE — Progress Notes (Signed)
HISTORY OF PRESENT ILLNESS:  Jocelyn [name] is a 45 y.o. female with hypertension, dyslipidemia, long-standing type 1 diabetes mellitus, GERD, and anxiety/depression. She was evaluated in March of 2011 , for abdominal pain. Upper endoscopy at that time was unremarkable. Abdominal ultrasound was unrevealing except for fatty liver. Multiple laboratories were normal including pancreatic enzymes and liver function tests. She subsequently underwent a HIDA scan. This demonstrated a decreased ejection fraction. She was feeling better and it was elected to observe her. She did well until about 3 weeks ago when she developed moderate (A. out of 10) sharp epigastric pain with radiation into the back. Similar to problems with pain 1 year previous. She treated herself with altered diet (Bratt diet), Carafate, an increased dose of PPI. Associated with this pain was increased belching, increased reflux symptoms, and decreased appetite. Overall, her symptoms have improved but still some vague discomfort noticed. Aside from several floating bowel movements and some nausea, her GI review of systems is negative. She did undergo hysterectomy last September. Apparently had problems with aspiration postoperatively. No new medications. No dysphagia.  REVIEW OF SYSTEMS:  All non-GI ROS negative except for sinus and allergy trouble, cough, fatigue, headaches, night sweats, and sleeping problems.  Past Medical History  Diagnosis Date  . DM (diabetes mellitus)   . HTN (hypertension)   . Dyslipidemia   . Chest pain     RECURRENT WITH NEGITIVE CATHERIZATIONS  . Anxiety   . Depression   . OSA (obstructive sleep apnea)   . Fatty liver   . DVT (deep venous thrombosis)   . GERD (gastroesophageal reflux disease)     Past Surgical History  Procedure Date  . Carpal tunnel release   . Fibroid tumor     UTERUS SURGERY  . Laparoscopic hysterectomy     Social History JAKAI ONOFRE  reports that she has never smoked. She  does not have any smokeless tobacco history on file. She reports that she does not drink alcohol or use illicit drugs.  family history is not on file.  Allergies  Allergen Reactions  . Sulfonamide Derivatives     REACTION: GI distress       PHYSICAL EXAMINATION: Vital signs: BP 124/68  Pulse 96  Ht 5\' 1"  (1.549 m)  Wt 169 lb (76.658 kg)  BMI 31.93 kg/m2  Constitutional: generally well-appearing, no acute distress Psychiatric: alert and oriented x3, cooperative Eyes: extraocular movements intact, anicteric, conjunctiva pink Mouth: oral pharynx moist, no lesions Neck: supple no lymphadenopathy Cardiovascular: heart regular rate and rhythm, no murmur Lungs: clear to auscultation bilaterally Abdomen: soft, nontender, nondistended, no obvious ascites, no peritoneal signs, normal bowel sounds, no organomegaly Extremities: no lower extremity edema bilaterally Skin: no lesions on visible extremities Neuro: No focal deficits.    ASSESSMENT:  #1. Epigastric pain with radiation to the back. Improved with change in diet, Carafate, increased PPI. Suspect worsening reflux as the cause. #2. GERD #3. Decreased ejection fraction on HIDA scan. Clinical significance uncertain   PLAN:  #1. As she is doing better, I would recommend continuing on twice a day PPI and strict adherence to reflux precautions. We did discuss a surgical opinion, both feel that this is unnecessary present. This point, GI followup when necessary. If she has more problems with pain despite reflux measures and increased dose of PPI, consider advanced imaging such as CT scan and/or surgical opinion regarding possible occult gallbladder disease

## 2010-08-26 NOTE — Patient Instructions (Signed)
Follow up as needed

## 2010-08-27 NOTE — Assessment & Plan Note (Signed)
[facility]                         ELECTROPHYSIOLOGY OFFICE NOTE   DALYNN, JHAVERI                       MRN:          [id number]  DATE:01/12/2008                            DOB:          April 10, 1966    HISTORY OF PRESENT ILLNESS:  Ms. Fessel returns today for followup.  She  is a very pleasant young woman with diabetes, inappropriate sinus  tachycardia, hypertension, and recent diagnosis of dizziness.  The  etiology of dizziness is unclear.  We have decreased her Altace from 10  to 5 mg daily.  Her blood pressures have been well-controlled, and the  question is whether or not her dizziness is related in fact to  hypotension.  The patient does occasionally have chest discomfort also.  We had ordered a cardiac monitor which demonstrated no arrhythmias.   MEDICATIONS:  Today include  1. Protonix 40 a day.  2. Toprol-XL 25 a day.  3. Multiple vitamins.  4. Vytorin 10/80 daily.  5. Zyrtec 10 a day.  6. Aspirin 81 a day.  7. Humalog insulin.  8. Altace 5 mg a day.   PHYSICAL EXAMINATION:  GENERAL:  She is a pleasant well-appearing young  woman, in no distress.  Blood pressure was 108/70, the pulse 100 and  regular, respirations were 18.  VITAL SIGNS:  Weight was 162 pounds.  NECK:  No jugular distention.  LUNGS:  Clear bilaterally to auscultation.  No wheezes, rales, or  rhonchi are present.  CARDIOVASCULAR:  Regular rate and rhythm.  Normal S1 and S2.  ABDOMEN:  Soft, nontender.  EXTREMITIES:  Demonstrated, no edema.   IMPRESSION:  1. Unexplained dizziness.  2. Diabetes.  3. History of palpitations and sinus tachycardia.   DISCUSSION:  The etiology Ms. Berberich's dizziness is unclear.  I am still  concerned that she may become intermittently hypotensive, as the cause  of her dizziness and I have asked her to decrease her Altace from 5 to  2.5 mg daily.  Ultimately, we may consider changing her beta-  blockers to calcium channel blocker,  though I do not like to do this at  the present time.  I will see her back in office in several months to  see how she is doing.     Doylene Canning. Ladona Ridgel, MD  Electronically Signed    GWT/MedQ  DD: 01/12/2008  DT: 01/13/2008  Job #: [id number]   cc:   Gaspar Garbe, M.D.

## 2010-08-27 NOTE — H&P (Signed)
Jocelyn [name], Jocelyn [name]                ACCOUNT NO.:  [id number]   MEDICAL RECORD NO.:  [id number]          PATIENT TYPE:  AMB   LOCATION:  [facility]                           FACILITY:  [facility]   PHYSICIAN:  Guy Sandifer. Henderson Cloud, M.D. DATE OF BIRTH:  [birth year]   DATE OF ADMISSION:  DATE OF DISCHARGE:                              HISTORY & PHYSICAL   CHIEF COMPLAINT:  Heavy painful menses and pelvic pain.   HISTORY OF PRESENT ILLNESS:  This patient is a 45 year old single white  female, G0P0, who has had increasingly heavy menses over the past  several years.  She also has increasing pain before and with her menses.  Ultrasound in my office on October 19, 2007 revealed a uterus measuring 8.7  x 3.6 x 4.3 cm.  The endometrial stripe is thickened.  There is a 2.2-cm  simple cyst on the right ovary.  After discussion of options, she is  being admitted for hysteroscopy, D&C, NovaSure, endometrial ablation,  laparoscopy with bilateral tubal ligation.  Potential risks and  complications and success and failure rates of the above have been  reviewed preoperatively.   PAST MEDICAL HISTORY:  1. Insulin-dependent diabetes.  2. Chronic hypertension.  3. Menorrhagia as above.  4. Questionable history of pulmonary embolism that was not absolutely      confirmed.  Workup included a CAT scan and a cardiac      catheterization, both of which were reportedly normal.  However, it      is felt that this history precludes the use of hormones.  5. Depression and anxiety.  6. SVT.   FAMILY HISTORY:  Positive for diabetes, cancer, chronic hypertension.   MEDICATIONS:  1. Protonix 40 mg daily.  2. Toprol XL 25 mg daily.  3. Zoloft 50 mg daily.  4. Vitamins daily.  5. Baby aspirin daily.  6. Vytorin 80/10 daily.  7. Altace 5 mg daily.  8. Astelin spray twice a day.  9. Insulin pump.   ALLERGIES:  SULFA leading to a rash.  Also allergic to salmon and  squash.   SOCIAL HISTORY:  Denies tobacco, alcohol or drug  abuse.   REVIEW OF SYSTEMS:  NEURO:  Denies headache. CARDIAC:  No chest pain.  PULMONARY:  Denies shortness of breath.   PHYSICAL EXAM:  Height 5 feet, one 1/2 inches, weight 164.2 pounds,  blood pressure 112/68.  HEENT: Without thyromegaly.  LUNGS: Clear to auscultation.  HEART:  Regular rate and rhythm.  ABDOMEN: Soft, nontender without masses.  PELVIC EXAM:  Both the vagina and the cervix without lesion.  Uterus  normal size, mobile, nontender.  Adnexa nontender without masses.  EXTREMITIES:  Grossly within normal limits.  NEUROLOGICAL:  Exam grossly within normal limits.   ASSESSMENT:  Menorrhagia and pelvic pain.   PLAN:  1. Hysteroscopy.  2. Dilatation and curettage.  3. NovaSure endometrial ablation.  4. Laparoscopy with bilateral tubal ligation.      Guy Sandifer Henderson Cloud, M.D.  Electronically Signed     JET/MEDQ  D:  12/28/2007  T:  12/28/2007  Job:  [id number]

## 2010-08-27 NOTE — Assessment & Plan Note (Signed)
[facility]                         ELECTROPHYSIOLOGY OFFICE NOTE   LITZI, BINNING                       MRN:          [id number]  DATE:09/08/2007                            DOB:          [birth year]    Jocelyn [name] returns today for follow-up.  She is a very pleasant 45-year-  old woman who I have followed off and on for several years, but not seen  recently.  She has a history of recurrent palpitations and inappropriate  sinus tachycardia.  She has a history of chest pain with no obstructive  coronary disease, having  undergone catheterization.  She has  longstanding childhood diabetes and dyslipidemia.  The patient was in  her usual state of health until several weeks ago, when she was [place]  [place] [address] and became near-syncopal and had to stop her car she quickly.  She did not have frank syncope, she did not have an accident.  These  spells resolved and she thought nothing more of it; but then, earlier  this morning, she had a similar episode where she did not completely  pass out, but felt weak and dizzy and lightheaded and the sensation that  her heart was racing.  She subsequently presented here for additional  evaluation.  The patient also notes during certain times with stress she  gets tight in the chest but during exertion she does not have this,  though she does have some shortness of breath with exertion at times.   MEDICATIONS:  1. Protonix 40 a day.  2. Toprol XL 25 a day.  3. Zoloft 50 a day.  4. Altace 10 a day.  5. Vytorin 10/80.  6. Zyrtec 10 a day.  7. Aspirin 81 mg daily.  8. Humalog insulin in a pump.   On exam, she is a pleasant well-appearing young woman in no distress.  Blood pressure was 107/66 in the supine position with a heart rate of  84.  After 5 minutes of standing, the blood pressure was 112/77 with a  heart rate of 83.  NECK:  Revealed no jugular distention and no thyromegaly.  Trachea is  midline.  LUNGS:   Clear bilaterally to auscultation.  No wheezes, rales or rhonchi  are present.  CARDIOVASCULAR EXAM:  Revealed a regular rate and rhythm with normal S1  an S2.  There were no murmurs, rubs or gallops that I could appreciate.  ABDOMINAL EXAM:  Soft, nontender.  There is no organomegaly.  EXTREMITIES:  Demonstrate  No edema.   IMPRESSION:  1. Recurrent episode of near-syncope without frank syncope.  2. Recurrent episodes of dizziness and lightheadedness.  3. Sinus tachycardia.  4. Hypertension.  5. Diabetes.   DISCUSSION:  I do not understand why Ms. Mcdonnell is having these symptoms.  For starters, I would recommend that she wear a cardiac monitor and we  will obtain this and have her see [place] back several weeks from now.  Depending on results of the monitor, we will make additional  recommendations.     Doylene Canning. Ladona Ridgel, MD  Electronically Signed    GWT/MedQ  DD: 09/08/2007  DT: 09/08/2007  Job #: [id number]   cc:   Gaspar Garbe, M.D.

## 2010-08-27 NOTE — Assessment & Plan Note (Signed)
[facility]                         ELECTROPHYSIOLOGY OFFICE NOTE   Jocelyn, [name]                       MRN:          [id number]  DATE:10/18/2007                            DOB:          09/22/1965    HISTORY:  Jocelyn [name] returns today for followup.  Jocelyn [name] is a very pleasant  45 year old woman with a history of recurrent episodes of dizziness as  well as autonomic dysfunction and inappropriate sinus tachycardia.  Because of worsening dizziness and near-syncope, Jocelyn [name] was seen by me and  had a cardiac monitor obtained, which demonstrated no obvious  arrhythmias.  This was in the setting of dizziness.  Jocelyn [name] returns today  for followup.  The patient remains otherwise stable.  It is unclear to  me if this still represents autonomic dysfunction or whether there is  some sort of inner ear problem.   CURRENT MEDICATIONS:  1. Protonix 40 a day.  2. Toprol-XL 25 a day.  3. Zoloft 50 a day.  4. Altace 10 a day.  5. Vytorin 10/80.  6. Zyrtec 10 a day.  7. Insulin.  8. Jocelyn [name] is also on aspirin.   PHYSICAL EXAMINATION:  GENERAL:  Jocelyn [name] is a pleasant, well-appearing,  young woman in no distress.  VITAL SIGNS:  Blood pressure is 105/70, the pulse 88 and regular,  respirations are 18, and weight is 164 pounds.  NECK:  No jugular venous distention.  LUNGS:  Clear bilaterally to auscultation.  No wheezes, rales, or  rhonchi were present.  CARDIOVASCULAR:  Regular rate and rhythm.  Normal S1 and S2.  EXTREMITIES:  Demonstrate no edema.   Review of the patient's cardiac monitor demonstrates sinus rhythm, no  ectopy, and no arrhythmias.   IMPRESSION:  1. Unexplained dizziness and near syncope.  2. History of autonomic dysfunction.  3. Hypertension.  4. Diabetes.   DISCUSSION:  The etiology of the patient's symptoms is unclear.  I have  asked that Jocelyn [name] check her blood pressure on a regular basis, particularly  when Jocelyn [name] feels dizzy, to see if this correlates with  hypotension.  In  the interim, I have asked that Jocelyn [name] decrease her Altace from 10 mg a day  to  5 mg a day.  I will see her back in several weeks.  Jocelyn [name] may ultimately  require referral to an audiologist or an ear specialist.     Doylene Canning. Ladona Ridgel, MD  Electronically Signed    GWT/MedQ  DD: 10/18/2007  DT: 10/19/2007  Job #: [id number]   cc:   Gaspar Garbe, M.D.

## 2010-08-27 NOTE — Op Note (Signed)
Jocelyn [name], Jocelyn [name]                ACCOUNT NO.:  [id number]   MEDICAL RECORD NO.:  [id number]          PATIENT TYPE:  AMB   LOCATION:  SDC                           FACILITY:  [facility]   PHYSICIAN:  Guy Sandifer. Henderson Cloud, M.D. DATE OF BIRTH:  05-13-1965   DATE OF PROCEDURE:  DATE OF DISCHARGE:                               OPERATIVE REPORT   PREOPERATIVE DIAGNOSES:  1. Menorrhagia.  2. Desires permanent sterilization.   POSTOPERATIVE DIAGNOSES:  1. Menorrhagia.  2. Desires permanent sterilization.  3. Endometriosis.   PROCEDURES:  NovaSure endometrial ablation, hysteroscopy, dilatation and  curettage, laparoscopy, bilateral tubal ligation with Filshie clips and  ablation of endometriosis.   SURGEON:  Guy Sandifer. Henderson Cloud, MD   ANESTHESIA:  General with endotracheal intubation, Angelica Pou, MD   SPECIMENS:  Endometrial curettings to Pathology.   ESTIMATED BLOOD LOSS:  Less than 50 mL.   I&OS OF DISTENDING MEDIA:  Deficit 255 mL with a lot of that being on  the floor.   INDICATIONS AND CONSENT:  The patient is a 45 year old virginal white  female, G0, P0 with increasingly heavy menses and premenstrual pain.  Details are dictated in the history and physical.  Hysteroscopy D&C,  NovaSure endometrial ablation, laparoscopy with tubal ligation have been  discussed preoperatively.  Potential risks and complications have been  discussed preoperatively including, but not limited to infection,  uterine perforation, organ damage, bleeding requiring transfusion of  blood products with HIV and hepatitis acquisition, DVT, PE, pneumonia,  fistula formation, laparotomy.  Success and failure rates of the  ablation as well as the tubal ligation have also been reviewed.  All  questions have been answered and consent was signed on the chart.   FINDINGS:  The endometrial cavity is without abnormal structure.  Fallopian tube ostia identified bilaterally.  Laparoscopically, the  upper abdomen is  grossly normal.  The appendix is normal.  Uterus is  normal in size and contour.  There are white implants of endometriosis  in the middle portion of the left uterosacral ligament and immediately  lateral to the left uterosacral ligament close to its cervical  insertion.  Tubes and ovaries are normal bilaterally.   PROCEDURE:  After receiving 5000 units subcutaneously of heparin in the  preop holding area, the patient was taken to the operating room where  she was identified and placed in the dorsal supine position.  PAS hose  were in place.  After identification, she undergoes general anesthesia  via endotracheal intubation.  She was then placed in the dorsal  lithotomy position where she was prepped abdominally and vaginally,  bladder straight catheterized, and she was draped in the sterile  fashion.  The patient has a virginal introitus.  A narrow Peterson  speculum was used.  The anterior cervical lip was injected with 0.5%  plain Marcaine and grasped with single-tooth tenaculum.  Paracervical  block was placed at 2, 4, 5, 7, 8 and 10 o'clock positions with  approximately 10 mL of the same solution.  Uterus sounds to 8 cm and the  cervix sounds to 4 cm.  Cervix was gently progressively dilated to 27  dilator.  Diagnostic hysteroscope was placed and advanced under direct  visualization using distending media.  The above findings were noted.  Hysteroscope was withdrawn and sharp curettage was carried out.  Curettings were sent to Pathology.  Cervix was then dilated to 31  dilator.  NovaSure ablation device was then placed without difficulty.  Two attempts at the cavity test failed.  At this point, the NovaSure  device was removed and seem to be intact.  Reinspection with the  hysteroscope with excellent visualization of the entire cavity up into  the fornices fails revealing any evidence of perforation.  Therefore, a  new NovaSure device was once again placed.  This also fails the  cavity  test.  A new NovaSure machine was obtained and again the cavity test  fails.  The NovaSure device was removed and again inspection with the  hysteroscope reveals no evidence of perforation with excellent  visualization of the entire cavity.  The Hulka tenaculum was used to  replace the single-tooth tenaculum and attention was turned to the  abdomen.  The infraumbilical and suprapubic areas were injected with  0.5% plain Marcaine.  A small infraumbilical incision was made and a  disposable Veress needle was placed on the first attempt without  difficulty.  A normal syringe and drop test were noted.  Two liters of  gas were then insufflated under low pressure with good tympany in the  right upper quadrant.  Veress needle was removed and a 10-11 Xcel  bladeless disposable trocar sleeve was placed using direct visualization  with diagnostic laparoscope without difficulty.  The operative  laparoscope was then placed.  A small suprapubic incision was made and a  5-mm Xcel bladeless disposable trocar sleeve was placed under direct  visualization without difficulty.  It should also be noted that the  bladder was straight catheterized prior to the placement of the  suprapubic trocar sleeve to completely drain the bladder for an  additional 300 mL of clear urine.  After placement of suprapubic trocar  sleeve, inspection reveals the uterus to be normal in size and contour  as described above.  Close inspection fails to reveal any evidence of  perforation.  There was no disruption to the serosa, no evidence of  hematoma or blood at any point throughout the pelvis or on the uterus.  The right fallopian tube was identified from cornu to fimbria and a  Filshie clip was placed on the proximal one third of the fallopian tube.  Similar procedure was carried out on the left tube.  After removing the  Filshie clip applicator, careful reinspection reveals that the clip  completely surrounds the tube  and the heel of the clip can be seen  through the mesosalpinx bilaterally.  Bipolar cautery was used to ablate  the areas of endometriosis described above after identification of the  course of the ureter and seeing that it is well clear of the area of  surgery.  At this point, the suprapubic trocar sleeve was removed.  The  umbilical trocar sleeve was left in place and the pneumoperitoneum was  reduced.  Attention was returned to the vagina.  The cervix was again  grasped with a single-tooth tenaculum.  The same NovaSure device that  was last tried was again placed in the cavity of the uterus.  After  placement, a Vaseline gauze was wrapped around the cervix.  At this  point, the cavity test passes without  difficulty.  Ablation was carried  out at that point.  The NovaSure device was then removed and inspection  reveals it to be intact.  Hysteroscope was again placed in the uterus  and inspection reveals excellent cautery throughout the cavity of the  uterus.  Again, there was no evidence of perforation.  The hysteroscope  was removed.  The umbilical trocar sleeve was then removed as well.  Skin incisions on the abdomen were closed in the umbilicus with  interrupted 3-0 Vicryl suture.  Dermabond was placed on both incisions.  All counts were correct.  The patient was awakened, taken to the  recovery room in stable condition.      Guy Sandifer Henderson Cloud, M.D.  Electronically Signed     JET/MEDQ  D:  12/29/2007  T:  12/29/2007  Job:  [id number]

## 2010-08-29 ENCOUNTER — Ambulatory Visit (INDEPENDENT_AMBULATORY_CARE_PROVIDER_SITE_OTHER): Payer: BC Managed Care – PPO | Admitting: Internal Medicine

## 2010-08-29 DIAGNOSIS — R0602 Shortness of breath: Secondary | ICD-10-CM

## 2010-08-29 NOTE — Progress Notes (Signed)
Exercise Treadmill Test  Pre-Exercise Testing Evaluation Rhythm: normal sinus  Rate: 97   PR:  .13 QRS:  .08  QT:  .34 QTc: .43     Test  Exercise Tolerance Test Ordering MD: Lewayne Bunting, MD  Interpreting MD:  Lewayne Bunting, MD  Unique Test No: 1  Treadmill:  1  Indication for ETT: exertional dyspnea  Contraindication to ETT: No   Stress Modality: exercise - treadmill  Cardiac Imaging Performed: non   Protocol: standard Bruce - maximal  Max BP:188/81  Max MPHR (bpm):  174 85% MPR (bpm):  147  MPHR obtained (bpm):  171 % MPHR obtained:  97%  Reached 85% MPHR (min:sec):  5 min. Total Exercise Time (min-sec):  6:15  Workload in METS:  7.3 Borg Scale:14  Reason ETT Terminated:  dyspnea    ST Segment Analysis At Rest: normal ST segments - no evidence of significant ST depression With Exercise: no evidence of significant ST depression  Other Information Arrhythmia:  No Angina during ETT:  absent (0) Quality of ETT:  diagnostic  ETT Interpretation:  normal - no evidence of ischemia by ST analysis  Comments:   Recommendations:

## 2010-08-30 NOTE — Op Note (Signed)
[place]  Patient:    Jocelyn [name], Jocelyn [name]                       MRN: [id number] Proc. Date: 09/15/00 Adm. Date:  [id number] Attending:  Beatris Ship                           Operative Report  CHIEF COMPLAINT:  Chest pressure.  HISTORY OF PRESENT ILLNESS:  Jocelyn [name] is a 45 year old white female with diabetes mellitus type 1, long-standing, allergies, and hypertension, presenting at this time with chest heaviness.  She relates the onset of some malaise and dull headaches for approximately three to five days.  She relates the onset of tachycardia and palpitations on Sunday; these symptoms have waxed and waned but have somewhat resolved.  She had an episode of dyspnea upon exertion 24 hours prior to this, and today she presents to the emergency room after two hours of chest heaviness.  Continued heaviness exists but has improved following oxygen therapy.  She has had no arm radiation, significant edema, nausea, vomiting, or reflux.  She denies any abdominal pain, cough, or sputum production.  CT scan has confirmed bilateral pulmonary emboli, and she is admitted for anticoagulation.  ALLERGIES:  SULFA and SALMON.  MEDICATIONS: 1. Lente insulin 23 units a.c. breakfast, 16 units of Ultralente q.h.s.    Humalog 3 units a.c. breakfast, 6 units q.h.s., and regular 5 units a.c.    breakfast. 2. Altace 5 mg daily. 3. Oral contraceptive pills. 4. Astelin daily. 5. Zyrtec 10 mg q.d. 6. Aspirin daily.  PAST MEDICAL HISTORY:  Medical illnesses include diabetes mellitus type 1 x 29 years and essential hypertension.  Surgical illnesses:  None.  FAMILY HISTORY:  Noncontributory.  SOCIAL HISTORY:  Patient works part-time at [employer].  She does not smoke or drink.  PHYSICAL EXAMINATION:  VITAL SIGNS:  Blood pressure is 134/70, pulse 102 and regular, respiratory rate 24, temperature 98.9.  O2 saturation 99% on 2 L.  GENERAL:  Patient is  cheerful, no distress.  HEENT:  Normocephalic, atraumatic individual.  Anicteric.  NECK:  Supple.  No JVD or bruits.  CHEST:  Lungs are clear.  CARDIAC:  Regular rate and rhythm.  No murmur, gallop, rub, or heave.  ABDOMEN:  Mildly obese, soft, nontender, no hepatosplenomegaly.  EXTREMITIES:  No cyanosis, clubbing, or edema.  Calves soft and nontender. Distal pulses intact.  Joints normal.  NEUROLOGIC:  Nonfocal.  DIAGNOSTIC STUDIES:  Spiral CT scan confirms bilateral peripheral pulmonary emboli.  EKG:  Sinus tachycardia, no ischemic changes.  ASSESSMENT: 1. Pulmonary emboli.  Risk factors include oral contraceptive agents and    diabetes. 2. Diabetes mellitus type 1, fair control, A1c per her history is in the 7-8    range. 3. Essential hypertension. 4. Diabetic nephropathy.  PLAN:  Patient is admitted for anticoagulation.  Oral contraceptive agents will be discontinued.  Further pending her course. DD:  09/15/00 TD:  09/16/00 Job: [id number]

## 2010-08-30 NOTE — Discharge Summary (Signed)
NAMEALEXZANDREA, Jocelyn [name]                ACCOUNT NO.:  [id number]   MEDICAL RECORD NO.:  [id number]          PATIENT TYPE:  INP   LOCATION:  1409                         FACILITY:  [facility]   PHYSICIAN:  Gaspar Garbe, M.D.DATE OF BIRTH:  [birth year]   DATE OF ADMISSION:  10/26/2005  DATE OF DISCHARGE:  10/27/2005                                 DISCHARGE SUMMARY   FINAL DIAGNOSES:  1. Diabetic ketoacidosis secondary to pump failure, cannula failure.  2. Hypertension.  3. Gastroesophageal reflux disease.  4. Hyperlipidemia.  5. History of allergic rhinitis.  6. Mild obstructive sleep apnea.   MEDICATIONS AT DISCHARGE:  The patient is to resume her [employer] insulin pump.  She has been given a spare pump by the representative from the [employer] She is to continue her basals at 12 a.m. 1.25, 6 a.m. 1.65, 3  p.m. 1.95, 8 p.m. 1.6, with a sensitivity factor of 35 and a target of 130  and a carb ratio of 1 to 10. Other medications are the same.   RADIOLOGY RESULTS:  Chest x-ray showed normal chest.   LABORATORY RESULTS:  Initial white count 24.2, hemoglobin 13.9, hematocrit  41.8, platelets 346. Sodium 142, potassium 3.8, chloride 115, CO2 22,  glucose 154 at discharge, BUN 14, creatinine 0.8. Pregnancy test negative.  Urinalysis consists initially with glucose and ketones consistent with  diabetic ketoacidosis. She is LE and nitrite negative.   SUMMARY OF HOSPITAL COURSE:  The patient was admitted October 26, 2005 after  developing nausea and vomiting and having blood sugars that simply would not  come down with use of a pump. She tried new insulin and a new set; however,  was unable to bring her sugars down and sought medical care at the [facility] emergency room. After speaking with my partner, Dr. Clelia Croft, she  was admitted by him and put on Glucomander protocol overnight. The next  morning, I saw Debbie, and her blood sugars were considerably better. We ran  through  her pump, and she indicated that her pump had lost power and locked  up several times when trying to restart. I contact our local [employer]  representative who saw the patient at approximately none time and  reinstructed her on inserting her cannula, as there was some question as to  her cannula coming out, and gave her a loner pump while hers would be  serviced. The patient was able to eat breakfast and lunch without difficulty  and felt reasonably well. He was discharged home in good condition.   PHYSICAL EXAMINATION:  VITAL SIGNS:  On date of discharge, temperature 98.5,  pulse 101, respiratory rate 18, blood pressure 94/54, final CBG 147 by  fingerstick.  GENERAL:  No acute distress.  HEENT:  Normocephalic, atraumatic. PERRLA. EOMI. ENT within normal limits.  NECK:  Supple. No lymphadenopathy, JVD or bruit.  HEART:  Regular rate and rhythm. No murmur, rub or gallop appreciated.  LUNGS:  Clear to auscultation bilaterally.  ABDOMEN:  Soft, nontender, normal active bowel sounds. Her pump site appears  to be noninfected on the  left side of her upper abdomen.  EXTREMITIES:  No clubbing, cyanosis, or edema.  NEUROLOGICAL:  The patient is oriented x3 with cranial nerves II-XII intact  and no neurological deficits.   DISPOSITION:  The patient is to return home and follow up with me at a  regularly scheduled appointment within the next week. She has any further  difficulties she is to use NovoLog by direct injection. She indicated that  she also had Lantus at home if she had further difficulties and a supply of  pens. Her spare pump was set up by the [facility] representative with the same  settings as her home pump, and she will receive a new service pump within  the next couple of days per the [employer] representative. If she has any  further difficulties, she is to contact our office.      Gaspar Garbe, M.D.  Electronically Signed     RWT/MEDQ  D:  12/03/2005  T:  12/04/2005  Job:   [id number]

## 2010-08-30 NOTE — Op Note (Signed)
[name], [name]                ACCOUNT NO.:  [id number]   MEDICAL RECORD NO.:  [id number]          PATIENT TYPE:  AMB   LOCATION:  DSC                          FACILITY:  [facility]   PHYSICIAN:  Katy Fitch. 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   POSTOPERATIVE DIAGNOSIS:  Chronic entrapment neuropathy right median nerve  at carpal tunnel.   OPERATION:  Release of right transcarpal ligament.   OPERATING SURGEON:  Katy Fitch. Sypher, M.D.   ASSISTANT:  Jonni Sanger, P.A.   ANESTHESIA:  General by LMA.   SUPERVISING ANESTHESIOLOGIST:  Dr. Gelene Mink.   INDICATIONS:  [name] is a 45 year old woman referred by Dr. Wylene Simmer  for evaluation and management of a numb and painful right hand. Clinical  examination revealed signs of entrapment neuropathy of the right median  nerve at carpal tunnel. Electrodiagnostic studies confirmed median  neuropathy at the level of the transcarpal ligament.   Due to failure to respond to nonoperative measures, she is brought to the  operating room at this time for release of her right transverse carpal  ligament.   PROCEDURE:  [name] is brought to operating room and placed in supine  position on the operating table.   Following induction general anesthesia by LMA, right arm was prepped with  Betadine soap and solution and  sterilely draped.   Following exsanguination of the limb with Esmarch bandage, arterial  tourniquet was inflated to 120 mmHg. Procedure commenced with short incision  in line the ring finger and the palm. Subcutaneous tissues were carefully  divided revealing the palmar fascia. This was split longitudinally through  the common sensory branch of the median nerve. Theses are followed back to  transcarpal ligament which  was carefully isolated to the median nerve. The  ligament was released along its ulnar border extending into the distal  forearm.   This widely opened carpal canal. No mass or other predicaments were noted.  Bleeding points along the margin of the released ligament were controlled by  direct pressure followed by repair of the skin with intradermal 3-0 Prolene  suture.   A compressive dressing applied with a volar plaster splint maintaining the  wrist in five degrees of dorsiflexion.      RVS/MEDQ  D:  05/24/2004  T:  05/24/2004  Job:  [id number]   cc:   Tawanna Solo, M.D.  [address]  [place]  [place] [address]  Fax: [phone]

## 2010-08-30 NOTE — H&P (Signed)
NAMEANGELIGUE, BOWNE                ACCOUNT NO.:  [id number]   MEDICAL RECORD NO.:  [id number]          PATIENT TYPE:  INP   LOCATION:  0104                         FACILITY:  [facility]   PHYSICIAN:  Kari Baars, M.D.  DATE OF BIRTH:  [birth year]   DATE OF ADMISSION:  10/26/2005  DATE OF DISCHARGE:                                HISTORY & PHYSICAL   CHIEF COMPLAINT:  Elevated blood sugar and nausea and vomiting.   HISTORY OF PRESENT ILLNESS:  Jocelyn [name] is a very pleasant 45 year old white  female with type 1 diabetes, on an insulin pump, complicated by retinopathy,  who presented to the emergency department today with markedly elevated blood  sugar since last evening.  The patient states that she was in her usual  state of health with relatively well controlled blood sugars (last A1c  increased from 7.6 to 8.4).  Last evening she changed her insulin cartridge  and injection site.  She ate some popcorn.  Following this she checked her  blood sugar, noticed that it was up to 500.  She gave herself multiple  boluses throughout the night without improvement.  She changed her cartridge  again to a new site but still was unable to bring her blood sugar down and  this morning it was up to nearly 600.  She subsequently developed nausea and  vomiting with 5 episodes of emesis today.  She checked her urine ketones  which were positive.  She called me and I directed her to the Emergency  Department where I am evaluating her for DKA.  She denies any focal symptoms  other than those mentioned.  She denies any urinary symptoms, diarrhea,  cough.  She is having some chest tightness due to her vomiting.  She states  that she received a new pump in March, 2007, which has been functioning well  previously.   REVIEW OF SYSTEMS:  All systems were reviewed and were negative except as in  the HPI.  She denies any insect or tick exposure.  No rash.  No other focal  symptoms.   PAST MEDICAL HISTORY:  1.   Type 1 diabetes on an insulin pump since May, 2004.  Was diagnosed at      age 48.  No prior history of DKA.  Does have retinopathy.  Last A1c is      8.4, increased from 7.6.  2.  Palpitations.  3.  Hypertension.  4.  Cardiac catheterization in February, 2005, revealing less than 30%      obstruction in the LAD and otherwise normal coronaries.  5.  Gastroesophageal reflux disease.  6.  Hyperlipidemia.  7.  Allergic rhinitis.  8.  Mild obstructive sleep apnea.   MEDICATIONS:  Insulin pump with Humalog insulin.  Basal rates are 12 a.m.  1.25 units an hour, 6 a.m. 1.65 units an hour, 3 p.m. 1.95 units per hour, 8  p.m. 1.60 units per hour.  Her sensitivity factor is 35.  The target is 130.  Carb rate shows 1-10 units per carb.  Her total daily dose on her insulin  pump is 45 units thus far today by 3 p.m.   ALLERGIES:  SULFA, WHICH CAUSES GI DISTRESS.   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  She is single with no children.  No significant tobacco,  alcohol or drug use.   FAMILY HISTORY:  __________ grandfather had type 2 diabetes which required  insulin.   PHYSICAL EXAM:  Temperature 97.3, blood pressure 120/63, respirations 18,  pulse 110, oxygen saturation is 100% on room air.  GENERAL:  Alert and oriented x4.  Non-toxic appearing, although she is  vomiting.  HEENT:  Oropharynx is dry.  NECK:  Supple without lymphadenopathy, JVD.  No nuchal rigidity or  meningismus.  HEART:  Tachycardic without murmurs, rubs or gallops.  LUNGS:  Clear to auscultation bilaterally.  ABDOMEN:  Soft, nondistended, nontender with normal active bowel sounds.  EXTREMITIES:  No clubbing, cyanosis or edema.  SKIN:  No rash.  NEUROLOGIC:  Nonfocal exam.   LABS:  Are pending.   STUDIES:  EKG shows sinus tachycardia with no significant ST changes.   ASSESSMENT PLAN:  1.  Diabetic ketoacidosis.  Her uncontrollable hyperglycemia with urinary       ketones and nausea and vomiting are consistent with diabetic      ketoacidosis.  Laboratories confirming this are pending.  Will initiate      treatment with aggressive hydration, IV insulin per Glucomander protocol      and serial BMETs.  Will obtain a CBC, CMET, urinalysis, urine pregnancy,      chest x-ray, and blood cultures to rule out other causes.  The most      likely cause is pump site malfunction.  The pump seems to be working      correctly with a recorded 45 units in total daily dose.  She states that      she primed her insulin as she normally does.  Will continue the      Glucomander and saline and once her CBGs are less than 250 will      transition her to D5 normal saline and replete potassium when it is less      than 4.  Will hopefully be able to resume her pump tomorrow morning and      discharge home tomorrow if she is tolerating orals.  2.  Nausea and vomiting.  Likely secondary to #1.  No other gastrointestinal      symptoms to suggest a viral gastroenteritis.  3.  Hypertension.  Resume antihypertensives when tolerating orals.  4.  Fluid, electrolytes and nutrition.  Nothing by mouth until her symptoms      improve, then clear liquid diet and advance as tolerated.      Kari Baars, M.D.  Electronically Signed     WS/MEDQ  D:  10/26/2005  T:  10/26/2005  Job:  [id number]   cc:   Gaspar Garbe, M.D.  Fax: [phone]

## 2010-08-30 NOTE — Discharge Summary (Signed)
[place]. [facility]  Patient:    Jocelyn [name], Jocelyn [name]                       MRN: [id number] Adm. Date:  [id number] Disc. Date: [id number] Attending:  Beatris Ship                           Discharge Summary  FINAL DIAGNOSES: 1. Chest pain, cause unknown. 2. Type 1 diabetes. 3. Hypertension.  HISTORY OF PRESENT ILLNESS:  For details of the history of present illness, please see Dr. Pearletha Furl. Aronsons dictation.  Briefly, Jocelyn [name] is a 45 year old white female with type 1 diabetes, longstanding, allergies, and hypertension who presented with chest heaviness.  She related the onset of some malaise and dull headaches for approximately three to five days and then she had the onset of tachycardia and palpitations and those symptoms had waxed and waned.  She presented to the emergency room after two hours of chest heaviness which improved following oxygen therapy.  There is no radiation of the pain to her arm, no edema, nausea, vomiting, or reflux.  There was no abdominal pain, cough, sputum production.  HOSPITAL COURSE:  Initially, the patient underwent a CT scan which was thought to show bilateral pulmonary emboli and the patient was admitted for anticoagulation.  Her oral contraceptive pills were stopped.  However, re-evaluation of her CT scan and reconstruction by Dr. Gerrianne Scale did not in fact confirm pulmonary emboli.  A pulmonary consultation was obtained and in the end a pulmonary cause for her symptoms was not identified. Then a consultation was obtained from [facility].  The chest pain was thought to be worrisome for angina versus unstable angina.  The patient underwent a cardiac catheterization by Dr. Loraine Leriche Pulsipher which was completely normal.  The patient received Vioxx with a question of perhaps her chest pain was due to musculoskeletal etiology, and on September 18, 2000, the patients pain was better.  She underwent an  ultrasound of her gallbladder which was normal.  The patient was discharged to home.  Evaluations during the hospitalization included a transthoracic echocardiogram which showed overall left ventricular systolic function being normal.  A EKG on September 15, 2000 showed normal sinus rhythm and nonspecific T-wave abnormalities.  Other EKGs showed basically the same thing.  A CT scan on September 15, 2000 was initially read as small filling defects within branches of the pulmonary arteries and then a CT scan of the lower extremities was carried out which did not show DVT.  Dr. Gerrianne Scale then did reconstruction of the images and did not see definite evidence for acute pulmonary embolism or DVT on exam.  Chest x-ray on September 15, 2000 was normal.  Abdominal ultrasound on September 15, 2000 was normal.  Laboratory values on admission included a white blood cell count of 6.6, hemoglobin 12.7, hematocrit 37.2, platelets 377,000.  Coagulases were initially normal, but of course with heparin therapy the PTT was elevated.  Chemistries on admission were fairly normal with a creatinine of 0.8.  The only abnormality was high blood sugar of 238.  CK evaluation x 2 were normal on admission and thereafter.  Troponin I on admission was 0.01 with no indication of myocardial injury.  Lipids on September 13, 2000 included a cholesterol of 208, LDL 81, HDL 67, triglycerides 300.  TSH on admission was 5.969.  DISCHARGE INSTRUCTIONS:  Discharge instructions  were to stop her oral contraceptives and take her usual home medications as prior to admission.  She was to do no driving or heavy lifting for a few days after the discharge.  An appointment was to be set up with Dr. Petra Kuba to evaluate her GI tract.  CONDITION ON DISCHARGE:  Slightly better. DD:  11/11/00 TD:  11/11/00 Job: [id number]

## 2010-08-30 NOTE — Cardiovascular Report (Signed)
NAMEFELICITY, PENIX                          ACCOUNT NO.:  [id number]   MEDICAL RECORD NO.:  [id number]                   PATIENT TYPE:  OIB   LOCATION:  6501                                 FACILITY:  [facility]   PHYSICIAN:  Arturo Morton. Riley Kill, M.D.             DATE OF BIRTH:  [birth year]   DATE OF PROCEDURE:  05/26/2003  DATE OF DISCHARGE:                              CARDIAC CATHETERIZATION   PROCEDURES PERFORMED:  1. Left heart catheterization.  2. Selective coronary arteriography.   CARDIOLOGIST:  Arturo Morton. Riley Kill, M.D.   INDICATIONS:  This nice young lady has a history of obesity, hypertension  and longstanding diabetes.  She has recently had some exercise-induced chest  discomfort, which Dr. Ladona Ridgel has suspected might be related to cold-induced  asthma.  Nonetheless, she had a Cardiolite done that suggested possible  ischemia.  She walked seven minutes on the Bruce protocol reaching a heart  rate of 190 beats.  She had a blood pressure drop.  She had frequent  premature ventricular contractions.  She was found to have a small area of  anteroapical ischemia with an ejection fraction that was normal.  Cardiac  catheterization was recommended without ventriculography.   DESCRIPTION OF THE PROCEDURE:  The patient was brought to the cath lab and  prepped and draped in the usual fashion after informed consent.  Through an  anterior puncture the right femoral artery was easily entered and the 4  French sheath placed.  Following this central aortic and left ventricular  pressures were measured with a pigtail.  Ventriculography was not performed  per previous plan by Dr. Ladona Ridgel.  Coronary arteriography was then performed.  A JL-3.5 diagnostic catheter was utilized, but if an intervention will be  required a 3.0 guide would probably be necessitated.  A JR-4 was used to  cannula the right coronary.   The patient tolerated the procedure well and there were no complications.  She was  taken to the holding area in satisfactory clinical condition.   HEMODYNAMIC DATA:  1. Central aortic pressure 133/76.  2. Left ventricle 132/16.  3. No gradient on pullback across the aortic valve.   ANGIOGRAPHIC DATA:  1. Ventriculography:  Ventriculography was not performed per previous plan.   1. Left Main Coronary Artery:  The left main coronary artery was free of     critical disease.   1. Left Anterior Descending Artery:  The left anterior descending artery     coursed to the apex.  The LAD appeared to be smooth really throughout.     At the apical tip the vessel bifurcated. There were two major diagonal     branches.  The second diagonal branch had what appeared to be about a 30%     area of focal narrowing near the origin.  High-grade obstruction,     however, was not identified.   1. Circumflex:  The circumflex provides two  marginal branches.  The second     marginal branch bifurcates distally.  The AV circumflex was without     critical disease.   1. Right Coronary Artery:  The right coronary artery was a dominant vessel.     It was smooth throughout its course and provided a bifurcating posterior     descending and a bifurcating posterolateral branch.  There was a third     small posterolateral branch and an AV nodal artery.  The right was large     in caliber and free of critical disease.   CONCLUSION:  1. Normal left ventricular function previously demonstrated by radionuclide     imaging.  2. No high-grade areas of obstruction with the only area of abnormality     being a small diagonal with probably 30% ostial narrowing.   DISPOSITION:  The patient has had exercise-induced discomfort.  After  discussion with the family they feel that she would benefit from cardiac  rehab.  We will arrange follow up with Dr. Lewayne Bunting.                                               Arturo Morton. Riley Kill, M.D.    TDS/MEDQ  D:  05/26/2003  T:  05/27/2003  Job:  [id number]   cc:    Geoffry Paradise, M.D.  [address]  [place]  [place] [address]  Fax: [phone]   Doylene Canning. Ladona Ridgel, M.D.   Cardiopulmonary Laboratory

## 2010-08-30 NOTE — Cardiovascular Report (Signed)
[place]. [facility]  Patient:    Jocelyn [name], Jocelyn [name]                       MRN: [id number] Proc. Date: 09/17/00 Adm. Date:  [id number] Attending:  Beatris Ship CC:         Fritzi Mandes, M.D.  Cecil Cranker, M.D. [facility]   Cardiac Catheterization  PROCEDURES PERFORMED:  Left heart catheterization with coronary angiography and left ventriculography.  INDICATIONS:  [name] is a 45 year old woman with longstanding diabetes mellitus.  She was admitted with chest pain.  Because of her risk factors and concern for possible coronary artery disease, she was referred for cardiac catheterization.  PROCEDURAL NOTE:  A 6-French sheath was placed in the right femoral artery. Standard Judkins 6-French catheters were utilized.  Contrast was Omnipaque. There were no complications.  RESULTS:  HEMODYNAMICS:  Left ventricular pressure 110/8, aortic pressure 120/78.  There was no aortic valve gradient.  LEFT VENTRICULOGRAM:  Wall motion was normal.  Ejection fraction was calculated at 60%.  There was no mitral regurgitation.  CORONARY ARTERIOGRAPHY (RIGHT DOMINANT): Left main is normal.  The left anterior descending artery gives rise to a normal sized first diagonal and a small second diagonal.  The LAD is free of angiographic disease.  The left circumflex gives rise to a normal sized OM-1 and a small OM-2.  The left circumflex is free of angiographic disease.  The right coronary artery gives rise to a normal sized posterior descending artery and small posterolateral branch.  There is mild catheter induced spasm of the proximal right coronary artery which was relieved with nitroglycerin. The right coronary artery was otherwise free of angiographic disease.  IMPRESSIONS: 1. Normal left ventricular systolic function. 2. Normal coronary arteries by coronary angiography. DD:  09/17/00 TD:  09/17/00 Job: [id number]

## 2010-08-30 NOTE — Discharge Summary (Signed)
[place]. [facility]  Patient:    Jocelyn [name], Jocelyn [name]                       MRN: [id number] Adm. Date:  [id number] Disc. Date: [id number] Attending:  Beatris Ship                           Discharge Summary  FINAL DIAGNOSES: 1. Chest pain, cause unknown. 2. Type 1 diabetes. 3. Hypertension.  For details of the history of present illness, please see Dr. Guss Bunde dictation.  HISTORY OF PRESENT ILLNESS:  Briefly, Jocelyn [name] is a 45 year old white female with type 1 diabetes, long-standing, allergies, and hypertension, who presented with chest heaviness.  She related the onset of some malaise and dull headaches for approximately 3 to 5 days, then she had the onset of tachycardia and palpitations, and those symptoms had waxed and waned.  She presented to the emergency room after two hours of chest heaviness which improved following oxygen therapy.  There is no radiation of the pain to her arm, no edema, nausea, vomiting, or reflux.  There was no abdominal pain, cough, sputum production.  HOSPITAL COURSE:  Initially, the patient underwent a CT scan which was thought to show bilateral pulmonary emboli, and the patient was admitted for anticoagulation, her oral contraceptive pills were stopped.  However, re-evaluation of her CT scan and reconstruction by Dr. Roxy Horseman, did not in fact confirm pulmonary emboli.  INCOMPLETE DD:  11/11/00 TD:  11/11/00 Job: [id number]

## 2010-08-30 NOTE — Procedures (Signed)
Jocelyn [name], Jocelyn [name]                ACCOUNT NO.:  [id number]   MEDICAL RECORD NO.:  [id number]          PATIENT TYPE:  OUT   LOCATION:  SLEEP CENTER                 FACILITY:  [facility]   PHYSICIAN:  Clinton D. Maple Hudson, M.D. DATE OF BIRTH:  Aug 31, 1965   DATE OF STUDY:  02/06/2005                              NOCTURNAL POLYSOMNOGRAM   REFERRING PHYSICIAN:  Dr. Hermelinda Medicus.   INDICATION FOR SURGERY:  Hypersomnia with sleep apnea.   EPWORTH SLEEPINESS SCORE:  9/24, BMI 29.8, weight 158 pounds.   DAYTIME MEDICATIONS LISTED:  Toprol-XL, Protonix, Zoloft, Vytorin, Altace,  Astelin, Zyrtec, baby aspirin, multivitamins, insulin pump.   SLEEP ARCHITECTURE:  Total sleep time 296 minutes with sleep efficiency 70%.  Stage I 8%, stage II 53%, stages III and IV 29%, REM 10% of total sleep  time.  Sleep latency 23 minutes, REM latency 276 minutes, awake after sleep  onset 124 minutes, arousal index 26.   RESPIRATORY DATA:   NPSG PROTOCOL:  Apnea-hypopnea index (AHI, RDI) -- 5.1 obstructive events  per hour, indicating very mild obstructive sleep apnea/hypopnea syndrome.  (Normal is 0-5 per hour).  This included 4 obstructive apneas and 21  hypopneas.  Most events occurred while supine (AHI 10 per hour).  REM AHI 12  per hour.   OXYGEN DATA:  Mild-to-moderate snoring with oxygen desaturation to a nadir  of 92%.  Mean oxygen saturation through the study was 97% on room air.   CARDIAC DATA:  Normal cardiac rhythm.   MOVEMENT/PARASOMNIA:  Occasional leg jerk with little effect on sleep.  Bathroom x2.   IMPRESSION/RECOMMENDATION:  1.  Mild obstructive sleep apnea/hypopnea syndrome, apnea-hypopnea index 5.1      per hour with mild-to-moderate snoring and oxygen desaturation      negligible (92%).  2.  Respiratory events were associated with supine sleep position and REM.      Consider encouraging her to sleep off the flat of her back while      evaluating for alternative therapies.      Clinton D. Maple Hudson, M.D.  Diplomate, Biomedical engineer of Sleep Medicine  Electronically Signed     CDY/MEDQ  D:  02/09/2005 84:69:62  T:  02/10/2005 95:28:41  Job:  [id number]

## 2011-01-13 LAB — COMPREHENSIVE METABOLIC PANEL
CO2: 29
Calcium: 8.9
Chloride: 102
Creatinine, Ser: 0.74
GFR calc non Af Amer: >60
Glucose, Bld: 268 — ABNORMAL HIGH
Total Bilirubin: 0.4

## 2011-01-13 LAB — PROTIME-INR
INR: 0.9
Prothrombin Time: 12.7

## 2011-01-13 LAB — APTT: aPTT: 31

## 2011-01-13 LAB — HCG, SERUM, QUALITATIVE: Preg, Serum: NEGATIVE

## 2011-01-13 LAB — GLUCOSE, CAPILLARY: Glucose-Capillary: 193 — ABNORMAL HIGH

## 2011-01-13 LAB — CBC
HCT: 38.8
Hemoglobin: 12.9
MCHC: 33.1
MCV: 90.2
RBC: 4.3
WBC: 6.2

## 2011-01-21 ENCOUNTER — Ambulatory Visit: Payer: BC Managed Care – PPO | Attending: Internal Medicine | Admitting: Physical Therapy

## 2011-01-21 DIAGNOSIS — R42 Dizziness and giddiness: Secondary | ICD-10-CM | POA: Insufficient documentation

## 2011-01-21 DIAGNOSIS — IMO0001 Reserved for inherently not codable concepts without codable children: Secondary | ICD-10-CM | POA: Insufficient documentation

## 2011-02-04 ENCOUNTER — Ambulatory Visit: Payer: BC Managed Care – PPO | Admitting: Physical Therapy

## 2011-12-09 ENCOUNTER — Telehealth: Payer: Self-pay | Admitting: Internal Medicine

## 2011-12-09 NOTE — Telephone Encounter (Signed)
Pt states she is having abdominal pain like she has had in the past when she was having problems with her gallbladder. Pt requesting to be seen. Pt scheduled to see Willette Cluster NP tomorrow at 9:30am. Pt aware of appt date and time.

## 2011-12-10 ENCOUNTER — Ambulatory Visit (INDEPENDENT_AMBULATORY_CARE_PROVIDER_SITE_OTHER): Payer: BC Managed Care – PPO | Admitting: Nurse Practitioner

## 2011-12-10 ENCOUNTER — Encounter: Payer: Self-pay | Admitting: Nurse Practitioner

## 2011-12-10 VITALS — BP 110/70 | HR 62 | Ht 61.0 in | Wt 168.6 lb

## 2011-12-10 DIAGNOSIS — R935 Abnormal findings on diagnostic imaging of other abdominal regions, including retroperitoneum: Secondary | ICD-10-CM

## 2011-12-10 DIAGNOSIS — R1013 Epigastric pain: Secondary | ICD-10-CM

## 2011-12-10 NOTE — Patient Instructions (Addendum)
We are making a referral to [facility] for you to see an MD there. They will call you with an appointment. I made a request for you to see one of the Dr's that Dr. Marina Goodell prefers you see.

## 2011-12-10 NOTE — Progress Notes (Signed)
Jocelyn [name] [id number] 12/11/1965   HISTORY OR PRESENT ILLNESS : Patient is 46 year old female known to Dr. Marina Goodell. She has multiple medical problems including hypertension, dyslipidemia, diabetes, GERD, anxiety and depression. She was last seen in May 2012 for evaluation of recurrent epigastric pain. She was continued on twice daily PPI, anti-relux measures reiterated. Surgical opinion was discussed because of a decreased ejection fraction on HIDA scan but referral was not felt to be necessary at that time. Patient is back today with recurrent abdominal pain similar to previous pain. She was doing okay until Monday (2 days ago) when she began having recurrent epigastric pain radiating through to her back. She has been on daily Protonix, sometimes twice daily. She also recently started.Carafate 4 times a day. Pain is constant, unrelated to meals. She has associated nausea without vomiting. No fevers, she is following a [employer]. Her last ultrasound was March 2011, normal except fo fatty liver infiltration. Her last upper endoscopy was April 2012, it was normal except for a atrophic gastric mucosa.   Current Medications, Allergies, Past Medical History, Past Surgical History, Family History and Social History were reviewed in [employer] record.   PHYSICAL EXAMINATION : General:  Well developed  female in no acute distress Head: Normocephalic and atraumatic Eyes:  sclerae anicteric,conjunctive pink. Ears: Normal auditory acuity Neck: Supple, no masses.  Lungs: Clear throughout to auscultation Heart: Regular rate and rhythm Abdomen: Soft, nondistended, mild epigastric tenderness. Insulin pump as well as continuous glucose monitoring device are located in abdominal wall.  Normal bowel sounds Musculoskeletal: Symmetrical with no gross deformities  Skin: No lesions on visible extremities Extremities: No edema or deformities noted Neurological: Oriented x 4, grossly  nonfocal Cervical Nodes:  No significant cervical adenopathy Psychological:  Alert and cooperative. Normal mood and affect  ASSESSMENT AND PLAN :  1. recurrent epigastric pain radiating through to back and associated with nausea. This is same exact pain is when seen by Dr. Marina Goodell the last two times. Patient has a decreased gallbladder ejection fraction. Surgical evaluation discussed in the past, patient now interested in getting that referral.  Her symptoms are refractory to PPI, Carafate and Brat diet.    2. Type I diabetes, has insulin pump

## 2011-12-11 ENCOUNTER — Telehealth: Payer: Self-pay | Admitting: *Deleted

## 2011-12-11 NOTE — Telephone Encounter (Signed)
I called and had to leave a message for the patient.  I advised that she has an appointment @ [facility] with Dr. Glenna Fellows on 01-02-2012 @ 9:30 AM arrive at 9:00 AM.  Also [facility] is requesting she has a more current [place] or Hidascan.  Willette Cluster NP told me to order an Ultrasound.  I scheduled that @ [facility]  on 12-17-2011 at 9:30 and she needs to arrive at 9:15 AM.  She should have nothing by mouth 6 hours before the test.

## 2011-12-16 NOTE — Progress Notes (Signed)
Reviewed and agree with management. Rene Gonsoulin D. Noam Franzen, M.D., FACG  

## 2011-12-17 ENCOUNTER — Ambulatory Visit (HOSPITAL_COMMUNITY)
Admission: RE | Admit: 2011-12-17 | Discharge: 2011-12-17 | Disposition: A | Discharge status: Home / Self Care | Payer: BC Managed Care – PPO | Source: Ambulatory Visit | Attending: Nurse Practitioner | Admitting: Nurse Practitioner

## 2011-12-17 DIAGNOSIS — R935 Abnormal findings on diagnostic imaging of other abdominal regions, including retroperitoneum: Secondary | ICD-10-CM | POA: Insufficient documentation

## 2011-12-17 DIAGNOSIS — R110 Nausea: Secondary | ICD-10-CM | POA: Insufficient documentation

## 2011-12-17 DIAGNOSIS — R1013 Epigastric pain: Secondary | ICD-10-CM | POA: Insufficient documentation

## 2012-01-02 ENCOUNTER — Ambulatory Visit (INDEPENDENT_AMBULATORY_CARE_PROVIDER_SITE_OTHER): Payer: Self-pay | Admitting: General Surgery

## 2012-03-04 ENCOUNTER — Ambulatory Visit (INDEPENDENT_AMBULATORY_CARE_PROVIDER_SITE_OTHER): Payer: BC Managed Care – PPO | Admitting: General Surgery

## 2012-03-04 VITALS — BP 118/64 | HR 106 | Temp 97.8°F | Resp 20 | Ht 62.0 in | Wt 169.0 lb

## 2012-03-04 DIAGNOSIS — R1013 Epigastric pain: Secondary | ICD-10-CM

## 2012-03-04 NOTE — Progress Notes (Signed)
Subjective:   chief complaint: Recurrent epigastric and right upper quadrant abdominal pain  Patient ID: Jocelyn [name], female   DOB: 01/07/1966, 46 y.o.   MRN: [id number]  HPI Patient is a very pleasant 46-year-old female followed regularly by Dr. Perry who is referred for possible gallbladder disease. Dr. Perry has followed the patient long term for history of reflux. This has been well managed on medications. She however has about a 2 year history of gradually worsening episodic abdominal pain. She describes the onset of epigastric abdominal pain which is aching or pressure-like in usually after meals. This will often radiate to the right upper quadrant or through to her right scapula. There is no nausea or vomiting. The pain will last for about 2 days and gradually resolved and she feels well until the next episode. In recent months the pain has become more frequent and somewhat more severe. She has had an endoscopy earlier this year that was unremarkable. She gets only partial relief from acid therapy. She's had some occasional mild constipation but no major change in her bowel movements. No fever chills or jaundice. Workup to date has included an ultrasound of her abdomen that was unremarkable. HIDA scan that showed a reduced ejection fraction of 10%.  Past Medical History  Diagnosis Date  . DM (diabetes mellitus)   . HTN (hypertension)   . Dyslipidemia   . Chest pain     RECURRENT WITH NEGITIVE CATHERIZATIONS  . Anxiety   . Depression   . OSA (obstructive sleep apnea)   . Fatty liver   . DVT (deep venous thrombosis)   . GERD (gastroesophageal reflux disease)    Past Surgical History  Procedure Date  . Carpal tunnel release     right  . Fibroid tumor     UTERUS SURGERY  . Laparoscopic hysterectomy    Current Outpatient Prescriptions  Medication Sig Dispense Refill  . cetirizine (ZYRTEC) 10 MG tablet Take 10 mg by mouth daily.      . Ascorbic Acid (VITAMIN C) 500 MG tablet Take  500 mg by mouth daily.        . aspirin 81 MG tablet Take 81 mg by mouth daily.        . Calcium Carbonate (CALCIUM 500 PO) Take 2 tablets by mouth daily.        . Cyanocobalamin (VITAMIN B 12) 250 MCG LOZG Take 3 tablets by mouth daily.        . ezetimibe-simvastatin (VYTORIN) 10-80 MG per tablet Take 1 tablet by mouth at bedtime.        . Insulin Infusion Pump KIT as directed.        . metoprolol succinate (TOPROL-XL) 25 MG 24 hr tablet Take 25 mg by mouth daily.       . Multiple Vitamin (MULTIVITAMIN) tablet Take 1 tablet by mouth daily.        . pantoprazole (PROTONIX) 40 MG tablet Take 40 mg by mouth 2 (two) times daily.       . ramipril (ALTACE) 2.5 MG capsule Take 1 capsule (2.5 mg total) by mouth daily.  30 capsule  11  . sertraline (ZOLOFT) 100 MG tablet Take 100 mg by mouth daily.        . sucralfate (CARAFATE) 1 G tablet Take 1 g by mouth daily as needed.         Allergies  Allergen Reactions  . Sulfonamide Derivatives     REACTION: GI distress        Review of Systems  Constitutional: Negative.   HENT: Negative.   Respiratory: Negative.   Cardiovascular: Negative.   Gastrointestinal: Positive for abdominal pain and constipation. Negative for vomiting and blood in stool.  Neurological: Negative.        Objective:   Physical Exam BP 118/64  Pulse 106  Temp 97.8 F (36.6 C) (Temporal)  Resp 20  Ht 5' 2" (1.575 m)  Wt 169 lb (76.658 kg)  BMI 30.91 kg/m2 General: Alert, well-developed Caucasian female, in no distress Skin: Warm and dry without rash or infection. HEENT: No palpable masses or thyromegaly. Sclera nonicteric. Pupils equal round and reactive. Oropharynx clear. Lymph nodes: No cervical, supraclavicular, or inguinal nodes palpable. Lungs: Breath sounds clear and equal without increased work of breathing Cardiovascular: Regular rate and rhythm without murmur. No JVD or edema.  Abdomen: Nondistended. There is mild but definite tenderness in the epigastrium  and right upper quadrant without guarding.. No masses palpable. No organomegaly. No palpable hernias. Extremities: No edema or joint swelling or deformity. No chronic venous stasis changes. Neurologic: Alert and fully oriented. Gait normal.     Assessment:     46-year-old diabetic female with persistent and worsening episodic abdominal pain that is essentially classic for biliary disease. She has a negative abdominal ultrasound but a high the skin with decreased ejection fraction suggesting chronic cholecystitis or biliary dyskinesia. She's had thorough workup and treatment for other GI illnesses without improvement. She also has a family history of gallbladder disease. We discussed that response to surgery the situation is not as reliable as if she had gallstones but her symptoms are very suggestive and I would think she likely would have improvement post cholecystectomy. We discussed the procedure in detail including risks of anesthetic complications, bleeding, infection, bile leak or bile duct injury or intestinal injury and failed to relieve her symptoms. She would like to proceed with cholecystectomy I think this is very reasonable. 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 DVT prophylaxis with perioperative heparin      

## 2012-03-15 NOTE — Progress Notes (Signed)
Patient scheduled for surgery on 03/24/12.  Need orders placed in EPIC.  Thank You.

## 2012-03-16 ENCOUNTER — Other Ambulatory Visit (INDEPENDENT_AMBULATORY_CARE_PROVIDER_SITE_OTHER): Payer: Self-pay | Admitting: General Surgery

## 2012-03-16 ENCOUNTER — Encounter (HOSPITAL_COMMUNITY): Payer: Self-pay | Admitting: Pharmacy Technician

## 2012-03-17 NOTE — Progress Notes (Signed)
Please fax Basal rate insulin pump settings and a note for insulin pump management for day of surgery. Please fax asap to [facility] Fax: [phone]. Please call [phone] if problems.

## 2012-03-17 NOTE — Progress Notes (Signed)
Faxed Dr. Wylene Simmer for basal rate insulin setting and insulin pump management for day of surgery.

## 2012-03-18 ENCOUNTER — Encounter (HOSPITAL_COMMUNITY): Payer: Self-pay

## 2012-03-18 ENCOUNTER — Ambulatory Visit (HOSPITAL_COMMUNITY)
Admission: RE | Admit: 2012-03-18 | Discharge: 2012-03-18 | Disposition: A | Discharge status: Home / Self Care | Payer: BC Managed Care – PPO | Source: Ambulatory Visit | Attending: General Surgery | Admitting: General Surgery

## 2012-03-18 ENCOUNTER — Encounter (HOSPITAL_COMMUNITY)
Admission: RE | Admit: 2012-03-18 | Discharge: 2012-03-18 | Disposition: A | Discharge status: Home / Self Care | Payer: BC Managed Care – PPO | Source: Ambulatory Visit | Attending: General Surgery | Admitting: General Surgery

## 2012-03-18 DIAGNOSIS — Z01818 Encounter for other preprocedural examination: Secondary | ICD-10-CM | POA: Insufficient documentation

## 2012-03-18 DIAGNOSIS — Z01812 Encounter for preprocedural laboratory examination: Secondary | ICD-10-CM | POA: Insufficient documentation

## 2012-03-18 HISTORY — DX: Other pulmonary embolism without acute cor pulmonale: I26.99

## 2012-03-18 HISTORY — DX: Tachycardia, unspecified: R00.0

## 2012-03-18 HISTORY — DX: Adverse effect of unspecified anesthetic, initial encounter: T41.45XA

## 2012-03-18 HISTORY — DX: Other complications of anesthesia, initial encounter: T88.59XA

## 2012-03-18 LAB — CBC
HCT: 38.9 % (ref 36.0–46.0)
MCH: 30 pg (ref 26.0–34.0)
MCV: 85.1 fL (ref 78.0–100.0)
Platelets: 278 10*3/uL (ref 150–400)
RBC: 4.57 MIL/uL (ref 3.87–5.11)
RDW: 12.5 % (ref 11.5–15.5)

## 2012-03-18 LAB — BASIC METABOLIC PANEL
BUN: 11 mg/dL (ref 6–23)
CO2: 27 mEq/L (ref 19–32)
Calcium: 9.1 mg/dL (ref 8.4–10.5)
Creatinine, Ser: 0.74 mg/dL (ref 0.50–1.10)

## 2012-03-18 NOTE — Progress Notes (Signed)
Pt request to use own insulin and meter. Signed form about insulin pump and policy on chart.

## 2012-03-18 NOTE — Patient Instructions (Addendum)
20 Jocelyn [name]  03/18/2012   Your procedure is scheduled on: 03/24/12  Report to [facility] at  1:00 PM.  Call this number if you have problems the morning of surgery [phone]: [phone]   Remember: bring insulin pump supplies   Do not eat food After Midnight 03/23/12  Clear liquids midnight until 9:30AM on 03/24/12 then nothing.     Take these medicines the morning of surgery with A SIP OF WATER: metoprolol, protonix, zoloft   Do not wear jewelry, make-up or nail polish.  Do not wear lotions, powders, or perfumes. You may wear deodorant.  Do not shave 48 hours prior to surgery. Men may shave face and neck.  Do not bring valuables to the hospital.  Contacts, dentures or bridgework may not be worn into surgery.  Leave suitcase in the car. After surgery it may be brought to your room.  For patients admitted to the hospital, checkout time is 11:00 AM the day of discharge.   Patients discharged the day of surgery will not be allowed to drive home.  Name and phone number of your driver:[name] [phone]   Special Instructions: Shower using CHG 2 nights before surgery and the night before surgery.  If you shower the day of surgery use CHG.  Use special wash - you have one bottle of CHG for all showers.  You should use approximately 1/3 of the bottle for each shower.   Please read over the following fact sheets that you were given: MRSA Information, clear liquids fact sheet  [name], RN  pre op nurse call if needed [phone]    FAILURE TO FOLLOW THESE INSTRUCTIONS MAY RESULT IN CANCELLATION OF YOUR SURGERY   Patient Signature: ___________________________________________

## 2012-03-23 NOTE — Progress Notes (Signed)
Basal rate insulin pump settings and day of surgery insulin pump management note from [facility] medical on chart

## 2012-03-24 ENCOUNTER — Encounter (HOSPITAL_COMMUNITY): Payer: Self-pay | Admitting: Anesthesiology

## 2012-03-24 ENCOUNTER — Ambulatory Visit (HOSPITAL_COMMUNITY)
Admission: RE | Admit: 2012-03-24 | Discharge: 2012-03-24 | Disposition: A | Discharge status: Home / Self Care | Payer: BC Managed Care – PPO | Source: Ambulatory Visit | Attending: General Surgery | Admitting: General Surgery

## 2012-03-24 ENCOUNTER — Encounter (HOSPITAL_COMMUNITY)
Admission: RE | Disposition: A | Discharge status: Home / Self Care | Payer: Self-pay | Source: Ambulatory Visit | Attending: General Surgery

## 2012-03-24 ENCOUNTER — Ambulatory Visit (HOSPITAL_COMMUNITY): Payer: BC Managed Care – PPO

## 2012-03-24 ENCOUNTER — Ambulatory Visit (HOSPITAL_COMMUNITY): Payer: BC Managed Care – PPO | Admitting: Anesthesiology

## 2012-03-24 ENCOUNTER — Encounter (HOSPITAL_COMMUNITY): Payer: Self-pay | Admitting: *Deleted

## 2012-03-24 DIAGNOSIS — G4733 Obstructive sleep apnea (adult) (pediatric): Secondary | ICD-10-CM | POA: Insufficient documentation

## 2012-03-24 DIAGNOSIS — Z86718 Personal history of other venous thrombosis and embolism: Secondary | ICD-10-CM | POA: Insufficient documentation

## 2012-03-24 DIAGNOSIS — K219 Gastro-esophageal reflux disease without esophagitis: Secondary | ICD-10-CM | POA: Insufficient documentation

## 2012-03-24 DIAGNOSIS — I10 Essential (primary) hypertension: Secondary | ICD-10-CM | POA: Insufficient documentation

## 2012-03-24 DIAGNOSIS — K811 Chronic cholecystitis: Secondary | ICD-10-CM | POA: Insufficient documentation

## 2012-03-24 DIAGNOSIS — R1013 Epigastric pain: Secondary | ICD-10-CM

## 2012-03-24 DIAGNOSIS — Z79899 Other long term (current) drug therapy: Secondary | ICD-10-CM | POA: Insufficient documentation

## 2012-03-24 DIAGNOSIS — E785 Hyperlipidemia, unspecified: Secondary | ICD-10-CM | POA: Insufficient documentation

## 2012-03-24 DIAGNOSIS — E119 Type 2 diabetes mellitus without complications: Secondary | ICD-10-CM | POA: Insufficient documentation

## 2012-03-24 HISTORY — PX: CHOLECYSTECTOMY: SHX55

## 2012-03-24 LAB — GLUCOSE, CAPILLARY
Glucose-Capillary: 68 mg/dL — ABNORMAL LOW (ref 70–99)
Glucose-Capillary: 153 mg/dL — ABNORMAL HIGH (ref 70–99)

## 2012-03-24 SURGERY — LAPAROSCOPIC CHOLECYSTECTOMY WITH INTRAOPERATIVE CHOLANGIOGRAM
Anesthesia: General | Site: Abdomen | Wound class: Clean Contaminated

## 2012-03-24 MED ORDER — 0.9 % SODIUM CHLORIDE (POUR BTL) OPTIME
TOPICAL | Status: DC | PRN
  Administered 2012-03-24: 1000 mL

## 2012-03-24 MED ORDER — BUPIVACAINE-EPINEPHRINE 0.25% -1:200000 IJ SOLN
INTRAMUSCULAR | Status: DC | PRN
  Administered 2012-03-24: 28 mL

## 2012-03-24 MED ORDER — NEOSTIGMINE METHYLSULFATE 1 MG/ML IJ SOLN: INTRAMUSCULAR | Status: DC | PRN

## 2012-03-24 MED ORDER — ACETAMINOPHEN 10 MG/ML IV SOLN
INTRAVENOUS | Status: DC | PRN
  Administered 2012-03-24: 1000 mg via INTRAVENOUS

## 2012-03-24 MED ORDER — IOHEXOL 300 MG/ML  SOLN
INTRAMUSCULAR | Status: DC | PRN
  Administered 2012-03-24: 5 mL

## 2012-03-24 MED ORDER — ACETAMINOPHEN 10 MG/ML IV SOLN: 1000.0000 mg | Freq: Once | INTRAVENOUS | Status: DC | PRN

## 2012-03-24 MED ORDER — GLYCOPYRROLATE 0.2 MG/ML IJ SOLN
INTRAMUSCULAR | Status: DC | PRN
  Administered 2012-03-24: 0.4 mg via INTRAVENOUS

## 2012-03-24 MED ORDER — OXYCODONE HCL 5 MG/5ML PO SOLN: 5.0000 mg | Freq: Once | ORAL | Status: AC | PRN

## 2012-03-24 MED ORDER — DEXTROSE-NACL 5-0.9 % IV SOLN
INTRAVENOUS | Status: DC
  Administered 2012-03-24: 1000 mL via INTRAVENOUS
  Administered 2012-03-24 (×2): 400 mL via INTRAVENOUS

## 2012-03-24 MED ORDER — METOCLOPRAMIDE HCL 5 MG/ML IJ SOLN
INTRAMUSCULAR | Status: DC | PRN
  Administered 2012-03-24: 10 mg via INTRAVENOUS

## 2012-03-24 MED ORDER — HYDROMORPHONE HCL PF 1 MG/ML IJ SOLN: 0.2500 mg | INTRAMUSCULAR | Status: DC | PRN

## 2012-03-24 MED ORDER — LIDOCAINE HCL (CARDIAC) 20 MG/ML IV SOLN
INTRAVENOUS | Status: DC | PRN
  Administered 2012-03-24: 100 mg via INTRAVENOUS

## 2012-03-24 MED ORDER — IOHEXOL 300 MG/ML  SOLN
INTRAMUSCULAR | Status: AC
  Filled 2012-03-24: qty 1

## 2012-03-24 MED ORDER — BUPIVACAINE-EPINEPHRINE PF 0.25-1:200000 % IJ SOLN
INTRAMUSCULAR | Status: AC
  Filled 2012-03-24: qty 30

## 2012-03-24 MED ORDER — MIDAZOLAM HCL 5 MG/5ML IJ SOLN
INTRAMUSCULAR | Status: DC | PRN
  Administered 2012-03-24 (×2): 1 mg via INTRAVENOUS

## 2012-03-24 MED ORDER — LACTATED RINGERS IR SOLN
Status: DC | PRN
  Administered 2012-03-24: 1000 mL

## 2012-03-24 MED ORDER — ACETAMINOPHEN 10 MG/ML IV SOLN
INTRAVENOUS | Status: AC
  Filled 2012-03-24: qty 100

## 2012-03-24 MED ORDER — PROPOFOL 10 MG/ML IV BOLUS
INTRAVENOUS | Status: DC | PRN
  Administered 2012-03-24: 160 mg via INTRAVENOUS

## 2012-03-24 MED ORDER — ONDANSETRON HCL 4 MG/2ML IJ SOLN
INTRAMUSCULAR | Status: DC | PRN
  Administered 2012-03-24: 4 mg via INTRAVENOUS

## 2012-03-24 MED ORDER — SUCCINYLCHOLINE CHLORIDE 20 MG/ML IJ SOLN
INTRAMUSCULAR | Status: DC | PRN
  Administered 2012-03-24: 100 mg via INTRAVENOUS

## 2012-03-24 MED ORDER — PROMETHAZINE HCL 25 MG/ML IJ SOLN: 6.2500 mg | INTRAMUSCULAR | Status: DC | PRN

## 2012-03-24 MED ORDER — OXYCODONE HCL 5 MG PO TABS
5.0000 mg | ORAL_TABLET | Freq: Once | ORAL | Status: AC | PRN
Start: 2012-03-24 — End: 2012-03-24
  Administered 2012-03-24: 5 mg via ORAL
  Filled 2012-03-24: qty 1

## 2012-03-24 MED ORDER — LACTATED RINGERS IV SOLN: INTRAVENOUS | Status: DC

## 2012-03-24 MED ORDER — LACTATED RINGERS IV SOLN
INTRAVENOUS | Status: DC | PRN
  Administered 2012-03-24 (×2): via INTRAVENOUS

## 2012-03-24 MED ORDER — DEXTROSE 5 % IV SOLN: INTRAVENOUS | Status: DC

## 2012-03-24 MED ORDER — OXYCODONE-ACETAMINOPHEN 5-325 MG PO TABS: 1.0000 | ORAL_TABLET | ORAL | Status: DC | PRN

## 2012-03-24 MED ORDER — DEXTROSE 50 % IV SOLN
INTRAVENOUS | Status: AC
  Filled 2012-03-24: qty 50

## 2012-03-24 MED ORDER — CEFAZOLIN SODIUM-DEXTROSE 2-3 GM-% IV SOLR
INTRAVENOUS | Status: AC
  Filled 2012-03-24: qty 50

## 2012-03-24 MED ORDER — MEPERIDINE HCL 50 MG/ML IJ SOLN: 6.2500 mg | INTRAMUSCULAR | Status: DC | PRN

## 2012-03-24 MED ORDER — CEFAZOLIN SODIUM-DEXTROSE 2-3 GM-% IV SOLR
2.0000 g | INTRAVENOUS | Status: AC
  Administered 2012-03-24: 2 g via INTRAVENOUS

## 2012-03-24 MED ORDER — NEOSTIGMINE METHYLSULFATE 1 MG/ML IJ SOLN
INTRAMUSCULAR | Status: DC | PRN
  Administered 2012-03-24: 3 mg via INTRAVENOUS

## 2012-03-24 MED ORDER — FENTANYL CITRATE 0.05 MG/ML IJ SOLN
INTRAMUSCULAR | Status: DC | PRN
  Administered 2012-03-24: 50 ug via INTRAVENOUS
  Administered 2012-03-24: 100 ug via INTRAVENOUS
  Administered 2012-03-24: 50 ug via INTRAVENOUS
  Administered 2012-03-24 (×2): 100 ug via INTRAVENOUS

## 2012-03-24 MED ORDER — ROCURONIUM BROMIDE 100 MG/10ML IV SOLN
INTRAVENOUS | Status: DC | PRN
  Administered 2012-03-24: 30 mg via INTRAVENOUS

## 2012-03-24 SURGICAL SUPPLY — 59 items
ADH SKN CLS APL DERMABOND .7 (GAUZE/BANDAGES/DRESSINGS) ×1
APL SKNCLS STERI-STRIP NONHPOA (GAUZE/BANDAGES/DRESSINGS)
APPLIER CLIP 5 13 M/L LIGAMAX5 (MISCELLANEOUS) ×2
APPLIER CLIP ROT 10 11.4 M/L (STAPLE)
APR CLP MED LRG 11.4X10 (STAPLE)
APR CLP MED LRG 5 ANG JAW (MISCELLANEOUS) ×1
BAG SPEC RTRVL LRG 6X4 10 (ENDOMECHANICALS)
BENZOIN TINCTURE PRP APPL 2/3 (GAUZE/BANDAGES/DRESSINGS) IMPLANT
CABLE HIGH FREQUENCY MONO STRZ (ELECTRODE) ×1 IMPLANT
CANISTER SUCTION 2500CC (MISCELLANEOUS) ×2 IMPLANT
CATH REDDICK CHOLANGI 4FR 50CM (CATHETERS) IMPLANT
CHLORAPREP W/TINT 10.5 ML (MISCELLANEOUS) ×1 IMPLANT
CLIP APPLIE 5 13 M/L LIGAMAX5 (MISCELLANEOUS) IMPLANT
CLIP APPLIE ROT 10 11.4 M/L (STAPLE) ×1 IMPLANT
CLOTH BEACON ORANGE TIMEOUT ST (SAFETY) ×2 IMPLANT
COVER MAYO STAND STRL (DRAPES) ×2 IMPLANT
DECANTER SPIKE VIAL GLASS SM (MISCELLANEOUS) ×2 IMPLANT
DERMABOND ADVANCED (GAUZE/BANDAGES/DRESSINGS) ×1
DERMABOND ADVANCED .7 DNX12 (GAUZE/BANDAGES/DRESSINGS) IMPLANT
DEVICE TROCAR PUNCTURE CLOSURE (ENDOMECHANICALS) ×1 IMPLANT
DRAPE C-ARM 42X72 X-RAY (DRAPES) ×2 IMPLANT
DRAPE LAPAROSCOPIC ABDOMINAL (DRAPES) ×2 IMPLANT
ELECT REM PT RETURN 9FT ADLT (ELECTROSURGICAL) ×2
ELECTRODE REM PT RTRN 9FT ADLT (ELECTROSURGICAL) ×1 IMPLANT
GLOVE BIOGEL PI IND STRL 7.0 (GLOVE) ×1 IMPLANT
GLOVE BIOGEL PI INDICATOR 7.0 (GLOVE) ×1
GLOVE SS BIOGEL STRL SZ 7.5 (GLOVE) ×1 IMPLANT
GLOVE SUPERSENSE BIOGEL SZ 7.5 (GLOVE) ×1
GLOVE SURG SS PI 7.0 STRL IVOR (GLOVE) ×1 IMPLANT
GLOVE SURG SS PI 8.5 STRL IVOR (GLOVE) ×2
GLOVE SURG SS PI 8.5 STRL STRW (GLOVE) IMPLANT
GOWN STRL NON-REIN LRG LVL3 (GOWN DISPOSABLE) ×2 IMPLANT
GOWN STRL REIN XL XLG (GOWN DISPOSABLE) ×4 IMPLANT
HEMOSTAT SURGICEL 4X8 (HEMOSTASIS) IMPLANT
IV CATH 14GX2 1/4 (CATHETERS) IMPLANT
IV LACTATED RINGERS 1000ML (IV SOLUTION) ×1 IMPLANT
IV SET EXT 30 76VOL 4 MALE LL (IV SETS) IMPLANT
KIT BASIN OR (CUSTOM PROCEDURE TRAY) ×2 IMPLANT
NS IRRIG 1000ML POUR BTL (IV SOLUTION) ×1 IMPLANT
POUCH SPECIMEN RETRIEVAL 10MM (ENDOMECHANICALS) IMPLANT
SCISSORS LAP 5X35 DISP (ENDOMECHANICALS) ×1 IMPLANT
SET CHOLANGIOGRAPH MIX (MISCELLANEOUS) ×2 IMPLANT
SET IRRIG TUBING LAPAROSCOPIC (IRRIGATION / IRRIGATOR) ×2 IMPLANT
SLEEVE Z-THREAD 5X100MM (TROCAR) ×3 IMPLANT
SOLUTION ANTI FOG 6CC (MISCELLANEOUS) ×2 IMPLANT
STOPCOCK K 69 2C6206 (IV SETS) IMPLANT
STRIP CLOSURE SKIN 1/2X4 (GAUZE/BANDAGES/DRESSINGS) IMPLANT
SUT MNCRL AB 4-0 PS2 18 (SUTURE) ×2 IMPLANT
SUT VICRYL 2 0 18  UND BR (SUTURE) ×1
SUT VICRYL 2 0 18 UND BR (SUTURE) IMPLANT
SYS RETRIEVAL 5MM INZII UNIV (BASKET) ×2
SYSTEM RETRIEVL 5MM INZII UNIV (BASKET) IMPLANT
TOWEL OR 17X26 10 PK STRL BLUE (TOWEL DISPOSABLE) ×2 IMPLANT
TRAY LAP CHOLE (CUSTOM PROCEDURE TRAY) ×2 IMPLANT
TROCAR HASSON GELL 12X100 (TROCAR) ×1 IMPLANT
TROCAR XCEL NON-BLD 5MMX100MML (ENDOMECHANICALS) ×1 IMPLANT
TROCAR Z-THREAD FIOS 11X100 BL (TROCAR) ×1 IMPLANT
TROCAR Z-THREAD FIOS 5X100MM (TROCAR) ×2 IMPLANT
TUBING INSUFFLATION 10FT LAP (TUBING) ×2 IMPLANT

## 2012-03-24 NOTE — Interval H&P Note (Signed)
History and Physical Interval Note:  03/24/2012 2:16 PM  Jocelyn [name]  has presented today for surgery, with the diagnosis of chronic cholecystitis   The various methods of treatment have been discussed with the patient and family. After consideration of risks, benefits and other options for treatment, the patient has consented to  Procedure(s) (LRB) with comments: LAPAROSCOPIC CHOLECYSTECTOMY WITH INTRAOPERATIVE CHOLANGIOGRAM (N/A) as a surgical intervention .  The patient's history has been reviewed, patient examined, no change in status, stable for surgery.  I have reviewed the patient's chart and labs.  Questions were answered to the patient's satisfaction.     Athanasius Kesling T

## 2012-03-24 NOTE — Op Note (Signed)
Preoperative Diagnosis: chronic cholecystitis  Postoprative Diagnosis: same  Procedure: Procedure(s): LAPAROSCOPIC CHOLECYSTECTOMY WITH INTRAOPERATIVE CHOLANGIOGRAM   Surgeon: Glenna Fellows T   Assistants: None  Anesthesia:  General endotracheal anesthesiaDiagnos  Indications:   Patient is a 46 year old insulin-dependent diabetic who presents with worsening episodic epigastric and right upper quadrant abdominal pain very typical for biliary tract disease. She has had an extensive workup including CT scan and endoscopy and ultrasound which were all negative. Hepatobiliary scan shows a markedly reduced ejection fraction. Due to worsening symptoms and suspicion of chronic cholecystitis or biliary dyskinesia after extensive discussion in the office of benefits and risks detailed elsewhere we have elected to proceed with laparoscopic cholecystectomy in an effort to relieve her symptoms.  Procedure Detail:  Patient is brought to the operating room, placed in the supine position the operating table, and general endotracheal anesthesia induced. She received preoperative antibiotics and subcutaneous heparin. PAS were in place. The abdomen was widely sterilely prepped and draped. Patient timeout was performed and correct patient and procedure verified. I used a previous 1 cm infraumbilical incision dissection was carried down to the level of the fascia which was incised sharply for 5 mm under direct vision and the peritoneum entered bluntly. A 5 mm Hassan trocar was placed in a mattress suture of 0 Vicryl. Pneumoperitoneum was established. Under direct vision a 5 mm trocar was placed subxiphoid and 25 mm trochars along the right subcostal margin. The gallbladder was visualized and appeared somewhat thickened and fibrotic. The fundus was grasped and elevated over the liver the infundibulum retracted inferolaterally. Peritoneum which again was somewhat thickened fibrotic was incised anterior posterior  the closed triangle the distal gallbladder thoroughly dissected. The cystic artery was identified and closed triangle coursing up onto the gallbladder wall was divided between 2 proximal and one distal clip. The distal gallbladder was then thoroughly dissected and the gallbladder cystic duct junction dissected 360 in a good critical view obtained. When the anatomy was clear the cystic duct was clipped at the gallbladder junction and an operative cholangiogram obtained through the cystic duct which showed a very small caliber biliary tract but good visualization of the intrahepatic ducts, common bile duct and free flow to the duodenum. Following this a wedge catheter was removed and the cystic duct was triply clipped proximally and divided. The gallbladder was then dissected free from its bed using hook cautery. Complete hemostasis was obtained in the gallbladder bed. The gallbladder was placed in an Endo Catch bag and brought out through the umbilical incision. The abdomen was inspected for hemostasis or any evidence of trocar injury and everything looked fine. The mattress suture secured at the umbilicus. There was a little bleeding from the peritoneum and I placed an additional 0 Vicryl suture around the fascial and peritoneal edges with Endo close with complete hemostasis. All sutures evacuated and trochars removed. Skin incisions were closed with subcuticular Monocryl and Dermabond. Sponge needle and instrument counts were correct.  Findings: Probable chronic cholecystitis  Estimated Blood Loss:  Minimal         Drains: none  Blood Given: none          Specimens: gallbladder and contents        Complications:  * No complications entered in OR log *         Disposition: PACU - hemodynamically stable.         Condition: stable  Mariella Saa MD, FACS  03/24/2012, 4:22 PM

## 2012-03-24 NOTE — Progress Notes (Signed)
Patient has continuous glucose monitoring device.  Patient states blood sugar is 63 with following symptoms- Flushed, Sweating and feeling hypoglycemic.  Dr Jasper Loser MD notified Lactated Ringers was changed to D5 with Normal Saline per verbal order.

## 2012-03-24 NOTE — Progress Notes (Signed)
Patient's Blood sugar is now 177 per her continuous glucose monitor, all symptoms of hypoglycemia have cleared.  Patient received a 400cc bolus of D5 with NS per verbal order.  Fluids have been slowed and insulin pump has been turned back on by patient.

## 2012-03-24 NOTE — Anesthesia Procedure Notes (Signed)
Procedure Name: Intubation Date/Time: 03/24/2012 2:46 PM Performed by: Illene Silver Pre-anesthesia Checklist: Patient identified, Emergency Drugs available, Suction available and Patient being monitored Patient Re-evaluated:Patient Re-evaluated prior to inductionOxygen Delivery Method: Circle system utilized Preoxygenation: Pre-oxygenation with 100% oxygen Intubation Type: IV induction and Rapid sequence Ventilation: Mask ventilation without difficulty Laryngoscope size: glidescope. Grade View: Grade I Tube size: 7.5 mm Number of attempts: 1 Airway Equipment and Method: Patient positioned with wedge pillow,  Stylet and Video-laryngoscopy Placement Confirmation: ETT inserted through vocal cords under direct vision,  breath sounds checked- equal and bilateral and positive ETCO2 Secured at: 22 cm Tube secured with: Tape Dental Injury: Teeth and Oropharynx as per pre-operative assessment

## 2012-03-24 NOTE — Transfer of Care (Signed)
Immediate Anesthesia Transfer of Care Note  Patient: Jocelyn [name]  Procedure(s) Performed: Procedure(s) (LRB) with comments: LAPAROSCOPIC CHOLECYSTECTOMY WITH INTRAOPERATIVE CHOLANGIOGRAM (N/A)  Patient Location: PACU  Anesthesia Type:General  Level of Consciousness: awake, alert , oriented and patient cooperative  Airway & Oxygen Therapy: Patient Spontanous Breathing and Patient connected to face mask oxygen  Post-op Assessment: Report given to PACU RN, Post -op Vital signs reviewed and stable and Patient moving all extremities X 4  Post vital signs: stable  Complications: No apparent anesthesia complications

## 2012-03-24 NOTE — Progress Notes (Signed)
PACU NOTE:  Insulin Pump parts all in pt's gown pockets-3 pieces total.

## 2012-03-24 NOTE — Anesthesia Preprocedure Evaluation (Addendum)
Anesthesia Evaluation  Patient identified by MRN, date of birth, ID band Patient awake    Reviewed: Allergy & Precautions, H&P , NPO status , Patient's Chart, lab work & pertinent test results  Airway Mallampati: II TM Distance: >3 FB Neck ROM: Full    Dental  (+) Dental Advisory Given and Teeth Intact   Pulmonary shortness of breath and with exertion, PE breath sounds clear to auscultation  Pulmonary exam normal       Cardiovascular hypertension, Pt. on medications + dysrhythmias Supra Ventricular Tachycardia Rhythm:Regular Rate:Normal     Neuro/Psych PSYCHIATRIC DISORDERS Anxiety Depression negative neurological ROS     GI/Hepatic Neg liver ROS, GERD-  Medicated,  Endo/Other  diabetes, Type 1, Insulin Dependent  Renal/GU negative Renal ROS     Musculoskeletal negative musculoskeletal ROS (+)   Abdominal (+) + obese,   Peds  Hematology negative hematology ROS (+)   Anesthesia Other Findings   Reproductive/Obstetrics                         Anesthesia Physical Anesthesia Plan  ASA: III  Anesthesia Plan: General   Post-op Pain Management:    Induction: Intravenous  Airway Management Planned: Oral ETT  Additional Equipment:   Intra-op Plan:   Post-operative Plan: Extubation in OR  Informed Consent: I have reviewed the patients History and Physical, chart, labs and discussed the procedure including the risks, benefits and alternatives for the proposed anesthesia with the patient or authorized representative who has indicated his/her understanding and acceptance.   Dental advisory given  Plan Discussed with: CRNA  Anesthesia Plan Comments:         Anesthesia Quick Evaluation

## 2012-03-24 NOTE — H&P (View-Only) (Signed)
Subjective:   chief complaint: Recurrent epigastric and right upper quadrant abdominal pain  Patient ID: Jocelyn [name], female   DOB: 07-07-1965, 46 y.o.   MRN: [id number]  HPI Patient is a very pleasant 46 year old female followed regularly by Dr. Marina Goodell who is referred for possible gallbladder disease. Dr. Marina Goodell has followed the patient long term for history of reflux. This has been well managed on medications. She however has about a 2 year history of gradually worsening episodic abdominal pain. She describes the onset of epigastric abdominal pain which is aching or pressure-like in usually after meals. This will often radiate to the right upper quadrant or through to her right scapula. There is no nausea or vomiting. The pain will last for about 2 days and gradually resolved and she feels well until the next episode. In recent months the pain has become more frequent and somewhat more severe. She has had an endoscopy earlier this year that was unremarkable. She gets only partial relief from acid therapy. She's had some occasional mild constipation but no major change in her bowel movements. No fever chills or jaundice. Workup to date has included an ultrasound of her abdomen that was unremarkable. HIDA scan that showed a reduced ejection fraction of 10%.  Past Medical History  Diagnosis Date  . DM (diabetes mellitus)   . HTN (hypertension)   . Dyslipidemia   . Chest pain     RECURRENT WITH NEGITIVE CATHERIZATIONS  . Anxiety   . Depression   . OSA (obstructive sleep apnea)   . Fatty liver   . DVT (deep venous thrombosis)   . GERD (gastroesophageal reflux disease)    Past Surgical History  Procedure Date  . Carpal tunnel release     right  . Fibroid tumor     UTERUS SURGERY  . Laparoscopic hysterectomy    Current Outpatient Prescriptions  Medication Sig Dispense Refill  . cetirizine (ZYRTEC) 10 MG tablet Take 10 mg by mouth daily.      . Ascorbic Acid (VITAMIN C) 500 MG tablet Take  500 mg by mouth daily.        Marland Kitchen aspirin 81 MG tablet Take 81 mg by mouth daily.        . Calcium Carbonate (CALCIUM 500 PO) Take 2 tablets by mouth daily.        . Cyanocobalamin (VITAMIN B 12) 250 MCG LOZG Take 3 tablets by mouth daily.        Marland Kitchen ezetimibe-simvastatin (VYTORIN) 10-80 MG per tablet Take 1 tablet by mouth at bedtime.        . Insulin Infusion Pump KIT as directed.        . metoprolol succinate (TOPROL-XL) 25 MG 24 hr tablet Take 25 mg by mouth daily.       . Multiple Vitamin (MULTIVITAMIN) tablet Take 1 tablet by mouth daily.        . pantoprazole (PROTONIX) 40 MG tablet Take 40 mg by mouth 2 (two) times daily.       . ramipril (ALTACE) 2.5 MG capsule Take 1 capsule (2.5 mg total) by mouth daily.  30 capsule  11  . sertraline (ZOLOFT) 100 MG tablet Take 100 mg by mouth daily.        . sucralfate (CARAFATE) 1 G tablet Take 1 g by mouth daily as needed.         Allergies  Allergen Reactions  . Sulfonamide Derivatives     REACTION: GI distress  Review of Systems  Constitutional: Negative.   HENT: Negative.   Respiratory: Negative.   Cardiovascular: Negative.   Gastrointestinal: Positive for abdominal pain and constipation. Negative for vomiting and blood in stool.  Neurological: Negative.        Objective:   Physical Exam BP 118/64  Pulse 106  Temp 97.8 F (36.6 C) (Temporal)  Resp 20  Ht 5\' 2"  (1.575 m)  Wt 169 lb (76.658 kg)  BMI 30.91 kg/m2 General: Alert, well-developed Caucasian female, in no distress Skin: Warm and dry without rash or infection. HEENT: No palpable masses or thyromegaly. Sclera nonicteric. Pupils equal round and reactive. Oropharynx clear. Lymph nodes: No cervical, supraclavicular, or inguinal nodes palpable. Lungs: Breath sounds clear and equal without increased work of breathing Cardiovascular: Regular rate and rhythm without murmur. No JVD or edema.  Abdomen: Nondistended. There is mild but definite tenderness in the epigastrium  and right upper quadrant without guarding.. No masses palpable. No organomegaly. No palpable hernias. Extremities: No edema or joint swelling or deformity. No chronic venous stasis changes. Neurologic: Alert and fully oriented. Gait normal.     Assessment:     46 year old diabetic female with persistent and worsening episodic abdominal pain that is essentially classic for biliary disease. She has a negative abdominal ultrasound but a high the skin with decreased ejection fraction suggesting chronic cholecystitis or biliary dyskinesia. She's had thorough workup and treatment for other GI illnesses without improvement. She also has a family history of gallbladder disease. We discussed that response to surgery the situation is not as reliable as if she had gallstones but her symptoms are very suggestive and I would think she likely would have improvement post cholecystectomy. We discussed the procedure in detail including risks of anesthetic complications, bleeding, infection, bile leak or bile duct injury or intestinal injury and failed to relieve her symptoms. She would like to proceed with cholecystectomy I think this is very reasonable. 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 DVT prophylaxis with perioperative heparin

## 2012-03-24 NOTE — Anesthesia Postprocedure Evaluation (Signed)
Anesthesia Post Note  Patient: Jocelyn [name]  Procedure(s) Performed: Procedure(s) (LRB): LAPAROSCOPIC CHOLECYSTECTOMY WITH INTRAOPERATIVE CHOLANGIOGRAM (N/A)  Anesthesia type: General  Patient location: PACU  Post pain: Pain level controlled  Post assessment: Post-op Vital signs reviewed  Last Vitals: BP 124/55  Pulse 74  Temp 36.8 C (Oral)  Resp 14  SpO2 99%  Post vital signs: Reviewed  Level of consciousness: sedated  Complications: No apparent anesthesia complications

## 2012-03-25 ENCOUNTER — Encounter (HOSPITAL_COMMUNITY): Payer: Self-pay | Admitting: General Surgery

## 2012-04-15 ENCOUNTER — Ambulatory Visit (INDEPENDENT_AMBULATORY_CARE_PROVIDER_SITE_OTHER): Payer: BC Managed Care – PPO | Admitting: General Surgery

## 2012-04-15 ENCOUNTER — Encounter (INDEPENDENT_AMBULATORY_CARE_PROVIDER_SITE_OTHER): Payer: Self-pay | Admitting: General Surgery

## 2012-04-15 VITALS — BP 142/90 | HR 72 | Temp 97.4°F | Resp 16 | Ht 62.0 in | Wt 169.0 lb

## 2012-04-15 DIAGNOSIS — Z09 Encounter for follow-up examination after completed treatment for conditions other than malignant neoplasm: Secondary | ICD-10-CM

## 2012-04-15 NOTE — Progress Notes (Signed)
History: Patient returns for a postoperative visit following laparoscopic cholecystectomy with cholangiogram for apparent chronic cholecystitis with recurrent epigastric pain. She reports she is doing well. No particular problems postoperatively. She has some expected mild soreness that is improving. She feels that her epigastric pain was relieved with the surgery. She still has some milder midabdominal "discomfort" that is relieved immediately with Carafate.  Exam: BP 142/90  Pulse 72  Temp 97.4 F (36.3 C) (Temporal)  Resp 16  Ht 5\' 2"  (1.575 m)  Wt 169 lb (76.658 kg)  BMI 30.91 kg/m2 General: Appears well Abdomen: Soft and nontender. Incisions all well healed  Pathology: Revealed chronic cholecystitis  Assessment and plan: Doing well postop Cholecystectomy with initially good relief of symptoms and no complications. She's doing well enough that I did not give her a return visit but I encouraged her to call me with any concerns or questions.

## 2012-05-19 ENCOUNTER — Ambulatory Visit (INDEPENDENT_AMBULATORY_CARE_PROVIDER_SITE_OTHER): Payer: BC Managed Care – PPO | Admitting: Internal Medicine

## 2012-05-19 ENCOUNTER — Encounter: Payer: Self-pay | Admitting: Internal Medicine

## 2012-05-19 VITALS — BP 108/70 | HR 96 | Ht 62.0 in | Wt 167.2 lb

## 2012-05-19 DIAGNOSIS — I10 Essential (primary) hypertension: Secondary | ICD-10-CM

## 2012-05-19 DIAGNOSIS — R079 Chest pain, unspecified: Secondary | ICD-10-CM

## 2012-05-19 DIAGNOSIS — R0789 Other chest pain: Secondary | ICD-10-CM

## 2012-05-19 LAB — D-DIMER, QUANTITATIVE: D-Dimer, Quant: 0.27 ug/mL-FEU (ref 0.00–0.48)

## 2012-05-19 LAB — LIPASE: Lipase: <10 U/L (ref 0–75)

## 2012-05-19 NOTE — Assessment & Plan Note (Signed)
The etiology of her symptoms is unclear. I strongly doubt coronary artery disease however she has multiple cardiac risk factors. That her symptoms have not changed with exertion and that there is no other compelling anginal signs, including a normal EKG, I do not think inpatient observation is warranted. Her transient hypertension would raise the question of other causes of chest pain, specifically aortic dissection. In our office today however her blood pressure is normal and she appears very comfortable. Pulmonary embolism would be another cause of chest discomfort. Her pulse oximetry demonstrates a normal oxygen saturation at 99% with a heart rate in the low 90s. While she did have surgery 2 months ago, it seems a bit late for a blood clot to be causing a pulmonary embolism from her surgery. I've recommended that the patient obtain a d-dimer. If positive, followup CT scan will be warranted. We will schedule an echo stress test in the next week. I've given her the warning signs to remember which would result in her coming to the emergency room for additional valuation. These include worsening pain, shortness of breath, or syncope.

## 2012-05-19 NOTE — Patient Instructions (Addendum)
LABS Today:  D-Dimer, Amylase, Lipase.  A chest x-ray takes a picture of the organs and structures inside the chest, including the heart, lungs, and blood vessels. This test can show several things, including, whether the heart is enlarges; whether fluid is building up in the lungs; and whether pacemaker / defibrillator leads are still in place.  Your physician has requested that you have a stress echocardiogram. For further information please visit [email or web address] Please follow instruction sheet as given.

## 2012-05-19 NOTE — Assessment & Plan Note (Signed)
She has had hypertension in the past but her blood pressure in the office today was normal. I've encouraged the patient to check it regularly particularly in the next few days.

## 2012-05-19 NOTE — Progress Notes (Signed)
HPI Jocelyn [name] returns today for followup. She is a very pleasant 47 year old woman with a history of long-standing diabetes, chest pain, hypertension, obesity, and dyslipidemia. The patient has had chest pain of unknown etiology in the past. Extensive workup has been unrevealing. 2 months ago, she underwent cholecystectomy, and her chest pain is improved significantly. 2 days ago, she began to experience recurrent chest pain. The pain has been constant, is not improved or worsened with exertion. There is no relationship to position. With deep breathing, she notes that the pain is a little bit worse with expiration. She does not have shortness of breath, nausea, diarrhea, vomiting, or diaphoresis. One day ago, she began experiencing headache. Her headache has improved in severity. The chest pain is a 3/10 in the headache is now grade 3/10 in severity. She denies vision changes. She was seen by her primary physician earlier today and her blood pressure was initially noted to be high. No echocardiogram demonstrated no acute changes and no significant abnormality. Laboratory evaluation is unremarkable except for an abnormal glucose. Allergies  Allergen Reactions  . Sulfonamide Derivatives     REACTION: GI distress     Current Outpatient Prescriptions  Medication Sig Dispense Refill  . Ascorbic Acid (VITAMIN C) 500 MG tablet Take 500 mg by mouth daily.        Marland Kitchen aspirin 81 MG tablet Take 81 mg by mouth daily.        . Calcium Carbonate (CALCIUM 500 PO) Take 2 tablets by mouth daily.        . cetirizine (ZYRTEC) 10 MG tablet Take 10 mg by mouth daily.      . Cyanocobalamin (VITAMIN B 12) 250 MCG LOZG Take 3 tablets by mouth daily.        Marland Kitchen ezetimibe-simvastatin (VYTORIN) 10-80 MG per tablet Take 1 tablet by mouth at bedtime.        . Insulin Infusion Pump KIT as directed.       . insulin lispro (HUMALOG) 100 UNIT/ML injection Inject 46.85 Units into the skin as directed. 1.6 units at 12 am and 6am 1.725  units ,12pm 2.3 units  3pm 2.0  Units  and 8 pm 2.5 units  total of 46.85 units daily-BASIL RATE IN INSULIN PUMP      . metoprolol succinate (TOPROL-XL) 25 MG 24 hr tablet Take 25 mg by mouth daily before breakfast.       . Multiple Vitamin (MULTIVITAMIN) tablet Take 1 tablet by mouth daily.        . pantoprazole (PROTONIX) 40 MG tablet Take 40 mg by mouth 2 (two) times daily.       . ramipril (ALTACE) 2.5 MG capsule Take 2.5 mg by mouth at bedtime.      . sertraline (ZOLOFT) 100 MG tablet Take 100 mg by mouth daily before breakfast.      . sucralfate (CARAFATE) 1 G tablet Take 1 g by mouth daily as needed. GAS         Past Medical History  Diagnosis Date  . DM (diabetes mellitus)   . Dyslipidemia   . Chest pain     RECURRENT WITH NEGITIVE CATHERIZATIONS  . Anxiety   . Depression   . Fatty liver   . GERD (gastroesophageal reflux disease)   . Complication of anesthesia     aspirated and "gained 15pounds, ended up in ICU"  . Pulmonary embolus 2002  . Tachycardia     ROS:   All systems reviewed and negative  except as noted in the HPI.   Past Surgical History  Procedure Date  . Carpal tunnel release     right  . Fibroid tumor     UTERUS SURGERY  . Laparoscopic hysterectomy   . Cardiac catheterization     x2  . Cholecystectomy 03/24/2012    Procedure: LAPAROSCOPIC CHOLECYSTECTOMY WITH INTRAOPERATIVE CHOLANGIOGRAM;  Surgeon: Mariella Saa, MD;  Location: [facility];  Service: General;  Laterality: N/A;     Family History  Problem Relation Age of Onset  . Colon cancer Neg Hx   . Irritable bowel syndrome Father   . Gallbladder disease Mother   . Gallbladder disease      3 mat. uncles  . Cancer Maternal Grandmother     ovarian     History   Social History  . Marital Status: Single    Spouse Name: N/A    Number of Children: 0  . Years of Education: N/A   Occupational History  . registered dietician    Social History Main Topics  . Smoking status: Never  Smoker   . Smokeless tobacco: Never Used  . Alcohol Use: Yes     Comment: rarely  . Drug Use: No  . Sexually Active: Not on file   Other Topics Concern  . Not on file   Social History Narrative  . No narrative on file     BP 108/70  Pulse 96  Ht 5\' 2"  (1.575 m)  Wt 167 lb 3.2 oz (75.841 kg)  BMI 30.58 kg/m2  Physical Exam:  Well appearing middle-aged woman, NAD HEENT: Unremarkable Neck:  No JVD, no thyromegally Lungs:  Clear with no wheezes, rales, or rhonchi. HEART:  Regular rate rhythm, no murmurs, no rubs, no clicks there is no S3 or S4 gallop. Abd:  soft, positive bowel sounds, no organomegally, no rebound, no guarding Ext:  2 plus pulses, no edema, no cyanosis, no clubbing Skin:  No rashes no nodules Neuro:  CN II through XII intact, motor grossly intact  EKG Normal sinus rhythm at 100 beats per minute  Assess/Plan:

## 2012-05-20 ENCOUNTER — Telehealth: Payer: Self-pay | Admitting: Internal Medicine

## 2012-05-20 ENCOUNTER — Ambulatory Visit (INDEPENDENT_AMBULATORY_CARE_PROVIDER_SITE_OTHER)
Admission: RE | Admit: 2012-05-20 | Discharge: 2012-05-20 | Disposition: A | Discharge status: Home / Self Care | Payer: BC Managed Care – PPO | Source: Ambulatory Visit | Attending: Internal Medicine | Admitting: Internal Medicine

## 2012-05-20 DIAGNOSIS — R079 Chest pain, unspecified: Secondary | ICD-10-CM

## 2012-05-20 NOTE — Telephone Encounter (Signed)
Discussed with pt that she is having lots of epigastric pain. Has been seen by cardiologist and would like to be seen by Dr. Marina Goodell prior to 1st available. Pt scheduled to see Mike Gip PA tomorrow at 11am. Pt aware of appt date and time.

## 2012-05-21 ENCOUNTER — Encounter: Payer: Self-pay | Admitting: Physician Assistant

## 2012-05-21 ENCOUNTER — Ambulatory Visit (HOSPITAL_COMMUNITY)
Admission: RE | Admit: 2012-05-21 | Discharge: 2012-05-21 | Disposition: A | Discharge status: Home / Self Care | Payer: BC Managed Care – PPO | Source: Ambulatory Visit | Attending: Physician Assistant | Admitting: Physician Assistant

## 2012-05-21 ENCOUNTER — Other Ambulatory Visit (INDEPENDENT_AMBULATORY_CARE_PROVIDER_SITE_OTHER): Payer: BC Managed Care – PPO

## 2012-05-21 ENCOUNTER — Other Ambulatory Visit: Payer: BC Managed Care – PPO

## 2012-05-21 ENCOUNTER — Ambulatory Visit (INDEPENDENT_AMBULATORY_CARE_PROVIDER_SITE_OTHER): Payer: BC Managed Care – PPO | Admitting: Physician Assistant

## 2012-05-21 VITALS — BP 112/72 | HR 111 | Ht 62.0 in | Wt 168.2 lb

## 2012-05-21 DIAGNOSIS — R079 Chest pain, unspecified: Secondary | ICD-10-CM | POA: Insufficient documentation

## 2012-05-21 DIAGNOSIS — Z794 Long term (current) use of insulin: Secondary | ICD-10-CM

## 2012-05-21 DIAGNOSIS — E119 Type 2 diabetes mellitus without complications: Secondary | ICD-10-CM

## 2012-05-21 LAB — CBC WITH DIFFERENTIAL/PLATELET
Basophils Relative: 0.3 % (ref 0.0–3.0)
Eosinophils Relative: 5.1 % — ABNORMAL HIGH (ref 0.0–5.0)
Hemoglobin: 12.9 g/dL (ref 12.0–15.0)
Lymphocytes Relative: 23.2 % (ref 12.0–46.0)
Monocytes Relative: 8 % (ref 3.0–12.0)
Neutrophils Relative %: 63.4 % (ref 43.0–77.0)
RBC: 4.42 Mil/uL (ref 3.87–5.11)
WBC: 6 10*3/uL (ref 4.5–10.5)

## 2012-05-21 MED ORDER — IOHEXOL 350 MG/ML SOLN
100.0000 mL | Freq: Once | INTRAVENOUS | Status: AC | PRN
  Administered 2012-05-21: 80 mL via INTRAVENOUS

## 2012-05-21 NOTE — Progress Notes (Signed)
Agree with initial assessment and plans. Agree that symptoms are unlikely GI in nature. Also agree that pulmonary embolus should be ruled out with a CT angiogram. Amy Esterwood to followup on this today.

## 2012-05-21 NOTE — Progress Notes (Signed)
Subjective:    Patient ID: Jocelyn [name], female    DOB: 06/10/1965, 47 y.o.   MRN: [id number]  HPI Jocelyn [name] is a pleasant 47 year old white female known to Dr. Marina Goodell who has history of insulin-dependent diabetes mellitus with insulin pump, hypertension, dyslipidemia, and obesity. She does have a history of a pulmonary embolus in 2000 which was felt related to birth control pills at that time -.she did not have a DVT. She had undergone workup here in the fall of 2013 for complaints of epigastric and had a negative ultrasound but HIDA scan showing an EF of 10%. She had had upper endoscopy done in 2011 which was normal and has been maintained on PPI therapy . She was referred to Dr. Ivette Loyal source for consideration of laparoscopic cholecystectomy and underwent laparoscopic cholecystectomy 03/24/2012 with a negative cholangiogram. She was felt to have chronic cholecystitis but did not have any stones. She says she had onset of chest pain on Monday, 05/17/2012 is also been associated with a headache which has been lingering over the past several days. She has noticed that her pulse is been elevated especially with exertion and her appetite has been decreased. She states that the chest pain is constant and achy and burning in nature it radiates to her back between her shoulder blades. She is comfortable older in the day and has been sleeping in an elevated position. She does not feel that her pain is exacerbated by any particular activity or position and does not seem to have any association with eating. He has noticed some increased belching. She has a vague sense of shortness of breath. She has been taking proton next regularly twice daily and started on Carafate tablets 3 times daily. She specifically states that this pain is different than the epigastric pain that she had been having recurrent episodes of back in the fall. She was evaluated by Dr. Ladona Ridgel for cardiology on February 5 and had negative chest x-ray  and a normal d-dimer and is scheduled for a stress echo to be done. She has had complaints of chest pain in the past and it had a negative cath.   Review of Systems  Constitutional: Positive for appetite change.  Eyes: Negative.   Respiratory: Positive for shortness of breath.   Cardiovascular: Positive for chest pain and palpitations.  Genitourinary: Negative.   Musculoskeletal: Negative.   Neurological: Positive for headaches.  Hematological: Negative.   Psychiatric/Behavioral: Negative.    Outpatient Encounter Prescriptions as of 05/21/2012  Medication Sig Dispense Refill  . Ascorbic Acid (VITAMIN C) 500 MG tablet Take 500 mg by mouth daily.        Marland Kitchen aspirin 81 MG tablet Take 81 mg by mouth daily.        . Calcium Carbonate (CALCIUM 500 PO) Take 2 tablets by mouth daily.        . cetirizine (ZYRTEC) 10 MG tablet Take 10 mg by mouth daily.      . Cyanocobalamin (VITAMIN B 12) 250 MCG LOZG Take 3 tablets by mouth daily.        Marland Kitchen ezetimibe-simvastatin (VYTORIN) 10-80 MG per tablet Take 1 tablet by mouth at bedtime.        . Insulin Infusion Pump KIT as directed.       . insulin lispro (HUMALOG) 100 UNIT/ML injection Inject 46.85 Units into the skin as directed. 1.6 units at 12 am and 6am 1.725 units ,12pm 2.3 units  3pm 2.0  Units  and 8 pm 2.5  units  total of 46.85 units daily-BASIL RATE IN INSULIN PUMP      . metoprolol succinate (TOPROL-XL) 25 MG 24 hr tablet Take 25 mg by mouth daily before breakfast.       . Multiple Vitamin (MULTIVITAMIN) tablet Take 1 tablet by mouth daily.        . pantoprazole (PROTONIX) 40 MG tablet Take 40 mg by mouth 2 (two) times daily.       . ramipril (ALTACE) 2.5 MG capsule Take 2.5 mg by mouth at bedtime.      . sertraline (ZOLOFT) 100 MG tablet Take 100 mg by mouth daily before breakfast.      . sucralfate (CARAFATE) 1 G tablet Take 1 g by mouth daily as needed. GAS       Allergies  Allergen Reactions  . Sulfonamide Derivatives     REACTION: GI  distress   Patient Active Problem List  Diagnosis  . DIAB W/O COMP TYPE I [JUV] NOT STATED UNCNTRL  . DYSLIPIDEMIA  . ESSENTIAL HYPERTENSION, BENIGN  . UNSPECIFIED PAROXYSMAL TACHYCARDIA  . GERD  . DYSPNEA  . ABDOMINAL PAIN-EPIGASTRIC  . Chest pressure   History  Substance Use Topics  . Smoking status: Never Smoker   . Smokeless tobacco: Never Used  . Alcohol Use: Yes     Comment: rarely   indicated that her mother is alive. She indicated that her father is alive.      Objective:   Physical Exam well-developed white female in no acute distress, talkative blood pressure 112/72 pulse 112 height 5 foot 2 weight 168. HEENT; nontraumatic normocephalic EOMI PERRLA sclera anicteric,Neck; Supple no JVD, Cardiovascular; tachycardia regular rhythm with S1-S2 no murmur or gallop, Pulmonary; clear bilaterally, she has mild chest wall tenderness but has not really reproduce her current pain, Abdomen; soft nontender nondistended bowel sounds are active there is no palpable mass or hepatosplenomegaly, Rectal; exam not done, Extremities; no clubbing cyanosis or edema skin warm and dry, Psych; mood and affect normal and appropriate        Assessment & Plan:  #30 47 year old white female with a five-day history of substernal chest pain radiating to the back, associated with dull  headache and tachycardia vague sense of dyspnea. Workup earlier in the week with negative chest x-ray and d-dimer and stress echo is scheduled per cardiology Am concerned that she could have a Pulmonary embolus, I cannot relate her current symptoms to any significant underlying GI process. She does have history of chronic GERD but is on high-dose PPI therapy, and her symptoms are not consistent with esophagitis #2  insulin-dependent diabetes mellitus #3 status post recent laparoscopic cholecystectomy with negative IOC December 2013 #4 recurred #5 dyslipidemia #6 hypertension  Plan; continue twice-daily protonix 40  mg Continue Carafate 1 g between meals she will make this into a solution and drink Schedule for CT and GI O. of the chest this afternoon to rule out pulmonary embolus. Further plans pending results.

## 2012-05-21 NOTE — Patient Instructions (Addendum)
Your physician has requested that you go to the basement for the following lab work before leaving today:CBC.   You have been scheduled for a CTchest at [facility] Radiology department.  You are scheduled on 05/21/12 now at the Radiology department . You should arrive 15 minutes prior to your appointment time for registration. Please follow the written instructions below on the day of your exam:  WARNING: IF YOU ARE ALLERGIC TO IODINE/X-RAY DYE, PLEASE NOTIFY RADIOLOGY IMMEDIATELY AT [phone]! YOU WILL BE GIVEN A 13 HOUR PREMEDICATION PREP   You may take any medications as prescribed with a small amount of water except for the following: Metformin, Glucophage, Glucovance, Avandamet, Riomet, Fortamet, Actoplus Met, Janumet, Glumetza or Metaglip. The above medications must be held the day of the exam AND 48 hours after the exam.  The purpose of you drinking the oral contrast is to aid in the visualization of your intestinal tract. The contrast solution may cause some diarrhea. Before your exam is started, you will be given a small amount of fluid to drink. Depending on your individual set of symptoms, you may also receive an intravenous injection of x-ray contrast/dye. This test typically takes 30-45 minutes to complete.  They will call us when you are done with your CT scan. Please stay in Radiology until you have heard from our office.

## 2012-05-28 ENCOUNTER — Other Ambulatory Visit (HOSPITAL_COMMUNITY): Payer: BC Managed Care – PPO

## 2012-05-31 ENCOUNTER — Encounter: Payer: Self-pay | Admitting: Internal Medicine

## 2012-06-03 ENCOUNTER — Ambulatory Visit (HOSPITAL_COMMUNITY): Payer: BC Managed Care – PPO | Attending: Cardiology | Admitting: Radiology

## 2012-06-03 ENCOUNTER — Encounter: Payer: Self-pay | Admitting: Internal Medicine

## 2012-06-03 ENCOUNTER — Ambulatory Visit (HOSPITAL_COMMUNITY): Payer: BC Managed Care – PPO | Attending: Cardiology

## 2012-06-03 DIAGNOSIS — R072 Precordial pain: Secondary | ICD-10-CM

## 2012-06-03 DIAGNOSIS — R079 Chest pain, unspecified: Secondary | ICD-10-CM

## 2012-06-03 DIAGNOSIS — R000 Tachycardia, unspecified: Secondary | ICD-10-CM | POA: Insufficient documentation

## 2012-06-03 DIAGNOSIS — R0989 Other specified symptoms and signs involving the circulatory and respiratory systems: Secondary | ICD-10-CM

## 2012-06-03 DIAGNOSIS — R0609 Other forms of dyspnea: Secondary | ICD-10-CM | POA: Insufficient documentation

## 2012-06-03 DIAGNOSIS — I10 Essential (primary) hypertension: Secondary | ICD-10-CM | POA: Insufficient documentation

## 2012-06-03 DIAGNOSIS — E119 Type 2 diabetes mellitus without complications: Secondary | ICD-10-CM | POA: Insufficient documentation

## 2012-06-03 NOTE — Progress Notes (Signed)
Echocardiogram performed.  

## 2012-06-07 ENCOUNTER — Telehealth: Payer: Self-pay | Admitting: Internal Medicine

## 2012-06-07 NOTE — Telephone Encounter (Signed)
Pt saw Amy Esterwood for some epigastric discomfort she was having the first part of Feb. Per pt all labs and xrays have been normal but she continues to have Chest tightening and a lot of belching. Pt states that her father has a hiatal hernia and she is wondering if that may be what is causing her symptoms. Pt has an OV with Dr. Marina Goodell 06/15/12 but she wants to know if she should keep this or just be scheduled for a direct EGD. Let pt know I would check with Dr. Marina Goodell and get his recommendations. Dr. Marina Goodell please advise.

## 2012-06-07 NOTE — Telephone Encounter (Signed)
Spoke with pt and she is aware. Appt with Dr. Marina Goodell cancelled.

## 2012-06-07 NOTE — Telephone Encounter (Signed)
That looks like an old appointment. At the time of her recent visit, her problem was not felt likely to be GI. CT of the chest was negative. She has had cardiac workup. If she still having symptoms on PPI and Carafate, seriously doubt this is GI. Furthermore, she had a normal endoscopy a few years ago. At this point, I think is best that she return to her primary provider for further assessment of her chest pain complaints. No plans for repeat endoscopy at this time. Thanks

## 2012-06-15 ENCOUNTER — Ambulatory Visit: Payer: BC Managed Care – PPO | Admitting: Internal Medicine

## 2013-11-22 ENCOUNTER — Other Ambulatory Visit: Payer: Self-pay | Admitting: Obstetrics and Gynecology

## 2013-11-23 LAB — CYTOLOGY - PAP

## 2014-06-14 ENCOUNTER — Encounter (HOSPITAL_COMMUNITY): Payer: Self-pay

## 2014-06-14 ENCOUNTER — Emergency Department (HOSPITAL_COMMUNITY): Payer: BC Managed Care – PPO

## 2014-06-14 DIAGNOSIS — Z7982 Long term (current) use of aspirin: Secondary | ICD-10-CM | POA: Diagnosis not present

## 2014-06-14 DIAGNOSIS — E785 Hyperlipidemia, unspecified: Secondary | ICD-10-CM | POA: Insufficient documentation

## 2014-06-14 DIAGNOSIS — R0602 Shortness of breath: Secondary | ICD-10-CM | POA: Diagnosis not present

## 2014-06-14 DIAGNOSIS — Z86711 Personal history of pulmonary embolism: Secondary | ICD-10-CM | POA: Insufficient documentation

## 2014-06-14 DIAGNOSIS — K219 Gastro-esophageal reflux disease without esophagitis: Secondary | ICD-10-CM | POA: Insufficient documentation

## 2014-06-14 DIAGNOSIS — R0789 Other chest pain: Secondary | ICD-10-CM | POA: Diagnosis not present

## 2014-06-14 DIAGNOSIS — Z8659 Personal history of other mental and behavioral disorders: Secondary | ICD-10-CM | POA: Diagnosis not present

## 2014-06-14 DIAGNOSIS — Z79899 Other long term (current) drug therapy: Secondary | ICD-10-CM | POA: Insufficient documentation

## 2014-06-14 DIAGNOSIS — R002 Palpitations: Secondary | ICD-10-CM | POA: Insufficient documentation

## 2014-06-14 DIAGNOSIS — Z794 Long term (current) use of insulin: Secondary | ICD-10-CM | POA: Insufficient documentation

## 2014-06-14 DIAGNOSIS — E119 Type 2 diabetes mellitus without complications: Secondary | ICD-10-CM | POA: Diagnosis not present

## 2014-06-14 DIAGNOSIS — R61 Generalized hyperhidrosis: Secondary | ICD-10-CM | POA: Insufficient documentation

## 2014-06-14 LAB — CBC
HEMATOCRIT: 40.7 % (ref 36.0–46.0)
HEMOGLOBIN: 13.7 g/dL (ref 12.0–15.0)
MCH: 29.3 pg (ref 26.0–34.0)
MCHC: 33.7 g/dL (ref 30.0–36.0)
MCV: 87.2 fL (ref 78.0–100.0)
Platelets: 266 10*3/uL (ref 150–400)
RBC: 4.67 MIL/uL (ref 3.87–5.11)
RDW: 13.2 % (ref 11.5–15.5)
WBC: 7.7 10*3/uL (ref 4.0–10.5)

## 2014-06-14 LAB — I-STAT TROPONIN, ED: Troponin i, poc: 0 ng/mL (ref 0.00–0.08)

## 2014-06-14 NOTE — ED Notes (Addendum)
Pt. Reports sudden onset heart racing. States "my heart rate went from 50s to over 100". States she became diaphoretic and left arm numbness. States still feels tightness in chest and feels like her hear rate is "a little" fast. Hx of cardiac cath.

## 2014-06-15 ENCOUNTER — Emergency Department (HOSPITAL_COMMUNITY)
Admission: EM | Admit: 2014-06-15 | Discharge: 2014-06-15 | Disposition: A | Discharge status: Home / Self Care | Payer: BC Managed Care – PPO | Attending: Emergency Medicine | Admitting: Emergency Medicine

## 2014-06-15 ENCOUNTER — Telehealth: Payer: Self-pay | Admitting: Internal Medicine

## 2014-06-15 DIAGNOSIS — R002 Palpitations: Secondary | ICD-10-CM

## 2014-06-15 DIAGNOSIS — R0789 Other chest pain: Secondary | ICD-10-CM

## 2014-06-15 LAB — D-DIMER, QUANTITATIVE: D-Dimer, Quant: <0.27 ug/mL-FEU (ref 0.00–0.48)

## 2014-06-15 LAB — BASIC METABOLIC PANEL
Anion gap: 5 (ref 5–15)
BUN: 14 mg/dL (ref 6–23)
CALCIUM: 8.9 mg/dL (ref 8.4–10.5)
CO2: 27 mmol/L (ref 19–32)
Chloride: 104 mmol/L (ref 96–112)
Creatinine, Ser: 0.86 mg/dL (ref 0.50–1.10)
GFR calc Af Amer: >90 mL/min (ref >90)
GFR, EST NON AFRICAN AMERICAN: 78 mL/min — AB (ref >90)
GLUCOSE: 165 mg/dL — AB (ref 70–99)
Potassium: 3.3 mmol/L — ABNORMAL LOW (ref 3.5–5.1)
SODIUM: 136 mmol/L (ref 135–145)

## 2014-06-15 LAB — I-STAT TROPONIN, ED: Troponin i, poc: 0 ng/mL (ref 0.00–0.08)

## 2014-06-15 MED ORDER — NITROGLYCERIN 0.4 MG SL SUBL
0.4000 mg | SUBLINGUAL_TABLET | SUBLINGUAL | Status: DC | PRN
  Administered 2014-06-15: 0.4 mg via SUBLINGUAL
  Filled 2014-06-15: qty 1

## 2014-06-15 MED ORDER — ASPIRIN 81 MG PO CHEW
324.0000 mg | CHEWABLE_TABLET | Freq: Once | ORAL | Status: AC
  Administered 2014-06-15: 324 mg via ORAL
  Filled 2014-06-15: qty 4

## 2014-06-15 MED ORDER — TRAMADOL HCL 50 MG PO TABS
50.0000 mg | ORAL_TABLET | Freq: Once | ORAL | Status: AC
  Administered 2014-06-15: 50 mg via ORAL
  Filled 2014-06-15: qty 1

## 2014-06-15 NOTE — ED Notes (Signed)
Dr. Yelverton at the bedside.  

## 2014-06-15 NOTE — Telephone Encounter (Signed)
New message    Patient went to emergency room last night. Was advise to call the office.

## 2014-06-15 NOTE — ED Provider Notes (Signed)
CSN: [id number]     Arrival date & time 06/14/14  2244 History  This chart was scribed for Julianne Rice, MD by Rayfield Citizen, ED Scribe. This patient was seen in room A02C/A02C and the patient's care was started at 12:13 AM.    Chief Complaint  Patient presents with  . Palpitations   Patient is a 49 y.o. female presenting with palpitations. The history is provided by the patient. No language interpreter was used.  Palpitations Associated symptoms: diaphoresis and shortness of breath   Associated symptoms: no back pain, no chest pain, no cough, no dizziness, no nausea, no numbness, no vomiting and no weakness      HPI Comments: Jocelyn [name] is a 49 y.o. female with past medical history of DM, PE (2002, takes a baby aspirin every night, last dose 06/13/14), tachycardia, recurrent chest pain who presents to the Emergency Department complaining of sudden onset palpitations tonight. Patient explains that she was watching television when she felt her heart "stop, then speed up, going from a rate of 50 to 100 bpm." She also notes chest tightness, SOB, diaphoresis, and a "band-like sensation" around her left arm with numbness. Her chest tightness has improved slightly at this time but her heart rate still feels "a little fast." She states she has been compliant with all medications, including metoprolol. She denies cough, fever, chills.   CT angio chest last year negative for PE. Last cardiology visit (Dr. Lovena Le) in Feb 2014; stress test returned normal. She has had two cardiac caths, both returned normal.   Patient reports she was on hormonal birth control at the time of her prior PE; she is no longer on birth control at this time. She denies familial history of blood clots. She denies any recent long-term periods of immobility (no plane or car trips over 6-8 hours).   Past Medical History  Diagnosis Date  . DM (diabetes mellitus)   . Dyslipidemia   . Chest pain     RECURRENT WITH NEGITIVE  CATHERIZATIONS  . Anxiety   . Fatty liver   . GERD (gastroesophageal reflux disease)   . Complication of anesthesia     aspirated and "gained 15pounds, ended up in ICU"  . Pulmonary embolus 2002  . Tachycardia    Past Surgical History  Procedure Laterality Date  . Carpal tunnel release      right  . Fibroid tumor      UTERUS SURGERY  . Laparoscopic hysterectomy    . Cholecystectomy  03/24/2012    Procedure: LAPAROSCOPIC CHOLECYSTECTOMY WITH INTRAOPERATIVE CHOLANGIOGRAM;  Surgeon: Edward Jolly, MD;  Location: [facility];  Service: General;  Laterality: N/A;  . Cardiac catheterization      x2   Family History  Problem Relation Age of Onset  . Colon cancer Neg Hx   . Irritable bowel syndrome Father   . Gallbladder disease Mother   . Gallbladder disease      3 mat. uncles  . Cancer Maternal Grandmother     ovarian   History  Substance Use Topics  . Smoking status: Never Smoker   . Smokeless tobacco: Never Used  . Alcohol Use: Yes     Comment: rarely   OB History    No data available     Review of Systems  Constitutional: Positive for diaphoresis.  Respiratory: Positive for chest tightness and shortness of breath. Negative for cough.   Cardiovascular: Positive for palpitations. Negative for chest pain and leg swelling.  Gastrointestinal: Negative  for nausea, vomiting and abdominal pain.  Musculoskeletal: Negative for back pain, neck pain and neck stiffness.  Skin: Negative for rash and wound.  Neurological: Negative for dizziness, weakness, light-headedness, numbness and headaches.  All other systems reviewed and are negative.  Allergies  Sulfonamide derivatives  Home Medications   Prior to Admission medications   Medication Sig Start Date End Date Taking? Authorizing Provider  Ascorbic Acid (VITAMIN C) 500 MG tablet Take 500 mg by mouth daily.     Yes Historical Provider, MD  aspirin 81 MG tablet Take 81 mg by mouth daily.     Yes Historical Provider, MD   Calcium Carbonate (CALCIUM 500 PO) Take 2 tablets by mouth daily.     Yes Historical Provider, MD  canagliflozin (INVOKANA) 100 MG TABS tablet Take 100 mg by mouth daily.   Yes Historical Provider, MD  cetirizine (ZYRTEC) 10 MG tablet Take 10 mg by mouth daily.   Yes Historical Provider, MD  ezetimibe-simvastatin (VYTORIN) 10-80 MG per tablet Take 1 tablet by mouth at bedtime.     Yes Historical Provider, MD  Insulin Infusion Pump KIT as directed.    Yes Historical Provider, MD  insulin lispro (HUMALOG) 100 UNIT/ML injection Inject 46.85 Units into the skin as directed. 1.6 units at 12 am and 6am 1.725 units ,12pm 2.3 units  3pm 2.0  Units  and 8 pm 2.5 units  total of 46.85 units daily-BASIL RATE IN INSULIN PUMP   Yes Historical Provider, MD  metoprolol succinate (TOPROL-XL) 25 MG 24 hr tablet Take 25 mg by mouth daily before breakfast.    Yes Historical Provider, MD  Multiple Vitamin (MULTIVITAMIN) tablet Take 1 tablet by mouth daily.     Yes Historical Provider, MD  pantoprazole (PROTONIX) 40 MG tablet Take 40 mg by mouth 2 (two) times daily as needed (acid reflux). Takes every morning, can take a second time if needed for acid   Yes Historical Provider, MD  ramipril (ALTACE) 2.5 MG capsule Take 2.5 mg by mouth at bedtime.   Yes Historical Provider, MD  sertraline (ZOLOFT) 100 MG tablet Take 100 mg by mouth daily before breakfast.   Yes Historical Provider, MD  sucralfate (CARAFATE) 1 G tablet Take 1 g by mouth daily as needed. GAS   Yes Historical Provider, MD  Cyanocobalamin (VITAMIN B 12) 250 MCG LOZG Take 3 tablets by mouth daily.      Historical Provider, MD   BP 118/62 mmHg  Pulse 88  Temp(Src) 98.3 F (36.8 C) (Oral)  Resp 16  SpO2 98% Physical Exam  Constitutional: She is oriented to person, place, and time. She appears well-developed and well-nourished. No distress.  HENT:  Head: Normocephalic and atraumatic.  Mouth/Throat: Oropharynx is clear and moist.  Eyes: EOM are normal.  Pupils are equal, round, and reactive to light.  Neck: Normal range of motion. Neck supple.  Cardiovascular: Normal rate and regular rhythm.   Pulmonary/Chest: Effort normal and breath sounds normal. No respiratory distress. She has no wheezes. She has no rales. She exhibits no tenderness.  Abdominal: Soft. Bowel sounds are normal. She exhibits no distension and no mass. There is no tenderness. There is no rebound and no guarding.  Musculoskeletal: Normal range of motion. She exhibits no edema or tenderness.  No lower extremity swelling or pain.  Neurological: She is alert and oriented to person, place, and time.  Skin: Skin is warm and dry. No rash noted. No erythema.  Psychiatric: She has a normal mood  and affect. Her behavior is normal.  Nursing note and vitals reviewed.   ED Course  Procedures   DIAGNOSTIC STUDIES: Oxygen Saturation is 99% on RA, normal by my interpretation.    COORDINATION OF CARE: 12:22 AM Discussed treatment plan with pt at bedside and pt agreed to plan.   Labs Review Labs Reviewed  BASIC METABOLIC PANEL - Abnormal; Notable for the following:    Potassium 3.3 (*)    Glucose, Bld 165 (*)    GFR calc non Af Amer 78 (*)    All other components within normal limits  CBC  D-DIMER, QUANTITATIVE  I-STAT TROPOININ, ED  I-STAT TROPOININ, ED    Imaging Review Dg Chest 2 View  06/15/2014   CLINICAL DATA:  Mid to LEFT chest pain radiating to LEFT arm, shortness of breath beginning at 10:15 p.m. History of pulmonary embolism.  EXAM: CHEST  2 VIEW  COMPARISON:  Chest radiograph May 20, 2012  FINDINGS: The heart size and mediastinal contours are within normal limits. Diffuse mild interstitial prominence/bronchitic changes. Both lungs are clear. The visualized skeletal structures are unremarkable. Surgical clips in the included right abdomen likely reflect cholecystectomy.  IMPRESSION: Bronchitic changes/ mild interstitial prominence suggests atypical infection  without focal consolidation.   Electronically Signed   By: Elon Alas   On: 06/15/2014 00:17     EKG Interpretation   Date/Time:  Wednesday June 14 2014 22:57:16 EST Ventricular Rate:  86 PR Interval:  142 QRS Duration: 90 QT Interval:  358 QTC Calculation: 428 R Axis:   33 Text Interpretation:  Normal sinus rhythm Normal ECG Confirmed by  Lita Mains  MD, Meosha Castanon ([id number]) on 06/15/2014 12:09:15 AM      MDM   Final diagnoses:  Atypical chest pain  Palpitations   I personally performed the services described in this documentation, which was scribed in my presence. The recorded information has been reviewed and is accurate.    Discussed with cardiology fellow on call. Patient with normal EKG, troponin 2 which are normal. Patient also has a normal d-dimer. She continues to have mild central chest pain. This is a chronic condition and she's been worked up multiple times in the past her ongoing chest pain including recent normal stress test, recent CT angiogram of the chest, 2 normal cardiac catheterizations in the past. Cardiology fellow states he was patient can be discharged home safely to follow up with Dr. Tanna Furry office. Patient is comfortable with plan. She understands the need to return immediately for any change in her symptoms or for any concerns.  Julianne Rice, MD 06/15/14 [phone]

## 2014-06-15 NOTE — Telephone Encounter (Addendum)
Spoke with patient and let her know all her test looked good at the hospital.  If she is still feeling bad next week will work her in.  She will call Mon

## 2014-06-15 NOTE — ED Notes (Signed)
[place], MD at bedside. 

## 2014-06-15 NOTE — Discharge Instructions (Signed)
Call Dr. Tanna Furry office this morning and make appointment to follow-up with him. Return immediately for worsening symptoms, difficulty breathing or any concerns.  Chest Pain (Nonspecific) It is often hard to give a specific diagnosis for the cause of chest pain. There is always a chance that your pain could be related to something serious, such as a heart attack or a blood clot in the lungs. You need to follow up with your health care provider for further evaluation. CAUSES   Heartburn.  Pneumonia or bronchitis.  Anxiety or stress.  Inflammation around your heart (pericarditis) or lung (pleuritis or pleurisy).  A blood clot in the lung.  A collapsed lung (pneumothorax). It can develop suddenly on its own (spontaneous pneumothorax) or from trauma to the chest.  Shingles infection (herpes zoster virus). The chest wall is composed of bones, muscles, and cartilage. Any of these can be the source of the pain.  The bones can be bruised by injury.  The muscles or cartilage can be strained by coughing or overwork.  The cartilage can be affected by inflammation and become sore (costochondritis). DIAGNOSIS  Lab tests or other studies may be needed to find the cause of your pain. Your health care provider may have you take a test called an ambulatory electrocardiogram (ECG). An ECG records your heartbeat patterns over a 24-hour period. You may also have other tests, such as:  Transthoracic echocardiogram (TTE). During echocardiography, sound waves are used to evaluate how blood flows through your heart.  Transesophageal echocardiogram (TEE).  Cardiac monitoring. This allows your health care provider to monitor your heart rate and rhythm in real time.  Holter monitor. This is a portable device that records your heartbeat and can help diagnose heart arrhythmias. It allows your health care provider to track your heart activity for several days, if needed.  Stress tests by exercise or by  giving medicine that makes the heart beat faster. TREATMENT   Treatment depends on what may be causing your chest pain. Treatment may include:  Acid blockers for heartburn.  Anti-inflammatory medicine.  Pain medicine for inflammatory conditions.  Antibiotics if an infection is present.  You may be advised to change lifestyle habits. This includes stopping smoking and avoiding alcohol, caffeine, and chocolate.  You may be advised to keep your head raised (elevated) when sleeping. This reduces the chance of acid going backward from your stomach into your esophagus. Most of the time, nonspecific chest pain will improve within 2-3 days with rest and mild pain medicine.  HOME CARE INSTRUCTIONS   If antibiotics were prescribed, take them as directed. Finish them even if you start to feel better.  For the next few days, avoid physical activities that bring on chest pain. Continue physical activities as directed.  Do not use any tobacco products, including cigarettes, chewing tobacco, or electronic cigarettes.  Avoid drinking alcohol.  Only take medicine as directed by your health care provider.  Follow your health care provider's suggestions for further testing if your chest pain does not go away.  Keep any follow-up appointments you made. If you do not go to an appointment, you could develop lasting (chronic) problems with pain. If there is any problem keeping an appointment, call to reschedule. SEEK MEDICAL CARE IF:   Your chest pain does not go away, even after treatment.  You have a rash with blisters on your chest.  You have a fever. SEEK IMMEDIATE MEDICAL CARE IF:   You have increased chest pain or pain that  spreads to your arm, neck, jaw, back, or abdomen.  You have shortness of breath.  You have an increasing cough, or you cough up blood.  You have severe back or abdominal pain.  You feel nauseous or vomit.  You have severe weakness.  You faint.  You have  chills. This is an emergency. Do not wait to see if the pain will go away. Get medical help at once. Call your local emergency services (911 in [place] Do not drive yourself to the hospital. MAKE SURE YOU:   Understand these instructions.  Will watch your condition.  Will get help right away if you are not doing well or get worse. Document Released: 01/08/2005 Document Revised: 04/05/2013 Document Reviewed: 11/04/2007 [facility] Patient Information 2015 [place], [place]. This information is not intended to replace advice given to you by your health care provider. Make sure you discuss any questions you have with your health care provider.

## 2014-06-15 NOTE — ED Notes (Signed)
Called main lab to add on ddimer. 

## 2014-06-19 NOTE — Telephone Encounter (Signed)
Spoke with patient and she has the stomach bug.  She has had it all weekend.  Nausea is better but still has some dizziness.  She is attributing all her symptoms to the stomach bug and will call back if needed

## 2014-07-18 ENCOUNTER — Ambulatory Visit (INDEPENDENT_AMBULATORY_CARE_PROVIDER_SITE_OTHER): Payer: BC Managed Care – PPO | Admitting: Internal Medicine

## 2014-07-18 ENCOUNTER — Encounter: Payer: Self-pay | Admitting: Internal Medicine

## 2014-07-18 VITALS — BP 128/72 | HR 92 | Ht 62.0 in | Wt 165.0 lb

## 2014-07-18 DIAGNOSIS — K219 Gastro-esophageal reflux disease without esophagitis: Secondary | ICD-10-CM | POA: Diagnosis not present

## 2014-07-18 DIAGNOSIS — E119 Type 2 diabetes mellitus without complications: Secondary | ICD-10-CM | POA: Diagnosis not present

## 2014-07-18 DIAGNOSIS — IMO0001 Reserved for inherently not codable concepts without codable children: Secondary | ICD-10-CM

## 2014-07-18 DIAGNOSIS — Z794 Long term (current) use of insulin: Secondary | ICD-10-CM

## 2014-07-18 DIAGNOSIS — R0789 Other chest pain: Secondary | ICD-10-CM

## 2014-07-18 DIAGNOSIS — Z1211 Encounter for screening for malignant neoplasm of colon: Secondary | ICD-10-CM | POA: Diagnosis not present

## 2014-07-18 MED ORDER — MOVIPREP 100 G PO SOLR: 1.0000 | Freq: Once | ORAL | Status: DC

## 2014-07-18 NOTE — Patient Instructions (Signed)

## 2014-07-18 NOTE — Progress Notes (Signed)
HISTORY OF PRESENT ILLNESS:  Jocelyn [name] is a 49 y.o. female with long-standing1 diabetes mellitus, hypertension, dyslipidemia, obesity, GERD, pulmonary embolus, and anxiety/depression. She was evaluated in 2011 for abdominal pain. Upper endoscopy unremarkable. Abdominal ultrasound revealing fatty liver. She has since undergone cholecystectomy. She also has chronic recurrent atypical chest pain. Negative cardiac evaluation's including catheterization 2. For GERD she has been on pantoprazole 40 mg once or twice daily. Occasionally taking Carafate for atypical chest pain. Last seen in this office 2014. She presents today after having had an episode of chest pain for which she went to the emergency room 06/15/2014. She described chest tightness, tachycardia, diaphoresis, and left arm numbness. Workup was negative. She does have planned routine cardiac follow-up. She states that she took Carafate which help. She feels like she has "esophageal spasms". Describes a tightness in the lower chest. However, no pyrosis, regurgitation, dysphagia, nausea, or vomiting. Symptoms are not affected by meals. She does have occasional breakthrough reflux at night for which she will take an additional PPI. Next, she inquires about screening colonoscopy. Seemed to be 49. No family history. No lower GI complaints. She continues on insulin pump for diabetes. Outside laboratories and x-rays from ER visit reviewed.  REVIEW OF SYSTEMS:  All non-GI ROS negative except for sinus allergy, night sweats,  Past Medical History  Diagnosis Date  . DM (diabetes mellitus)   . Dyslipidemia   . Chest pain     RECURRENT WITH NEGITIVE CATHERIZATIONS  . Anxiety   . Fatty liver   . GERD (gastroesophageal reflux disease)   . Complication of anesthesia     aspirated and "gained 15pounds, ended up in ICU"  . Pulmonary embolus 2002  . Tachycardia     Past Surgical History  Procedure Laterality Date  . Carpal tunnel release     right  . Fibroid tumor      UTERUS SURGERY  . Laparoscopic hysterectomy    . Cholecystectomy  03/24/2012    Procedure: LAPAROSCOPIC CHOLECYSTECTOMY WITH INTRAOPERATIVE CHOLANGIOGRAM;  Surgeon: Edward Jolly, MD;  Location: [facility];  Service: General;  Laterality: N/A;  . Cardiac catheterization      x2    Social History Al Decant  reports that she has never smoked. She has never used smokeless tobacco. She reports that she drinks alcohol. She reports that she does not use illicit drugs.  family history includes Cancer in her maternal grandmother; Gallbladder disease in her mother and another family member; Irritable bowel syndrome in her father. There is no history of Colon cancer.  Allergies  Allergen Reactions  . Sulfonamide Derivatives     REACTION: GI distress       PHYSICAL EXAMINATION:  Vital signs: BP 128/72 mmHg  Pulse 92  Ht 5\' 2"  (1.575 m)  Wt 165 lb (74.844 kg)  BMI 30.17 kg/m2  Constitutional: generally well-appearing, no acute distress Psychiatric: alert and oriented x3, cooperative Eyes: extraocular movements intact, anicteric, conjunctiva pink Mouth: oral pharynx moist, no lesions. No thrush Neck: supple no lymphadenopathy Cardiovascular: heart regular rate and rhythm, no murmur Lungs: clear to auscultation bilaterally Abdomen: soft, obese, nontender, nondistended, no obvious ascites, no peritoneal signs, normal bowel sounds, no organomegaly. Insulin pump in place  Rectal: Deferred until colonoscopy Extremities: no lower extremity edema bilaterally Skin: no lesions on visible extremities Neuro: No focal deficits. No asterixis.   ASSESSMENT:  #1. Atypical chest pain. Felt to be noncardiac and non-GI. #2. GERD. Classic symptoms seemingly controlled with PPI,  rare breakthrough #3. Colon cancer screening. Appropriate candidate without contraindication #4. Multiple medical problems including insulin requiring diabetes   PLAN:  #1. Instructed  to continue pantoprazole. Told to take 30 minutes before first meal. For breakthrough classic symptoms, increased to twice daily . Okay to use Carafate, though not clear what this is treating. Do not take near other medications 2 hours #2. Reflux precautions with attention to weight loss #3. Schedule upper endoscopy to evaluate epigastric pressure.The nature of the procedure, as well as the risks, benefits, and alternatives were carefully and thoroughly reviewed with the patient. Ample time for discussion and questions allowed. The patient understood, was satisfied, and agreed to proceed. #4. Screening colonoscopy.  The nature of the procedure, as well as the risks, benefits, and alternatives were carefully and thoroughly reviewed with the patient. Ample time for discussion and questions allowed. The patient understood, was satisfied, and agreed to proceed. #5. Instructed to adjust insulin pump to avoid hypoglycemia.  Face-to-face encounter 45+ minutes

## 2014-07-19 ENCOUNTER — Ambulatory Visit (INDEPENDENT_AMBULATORY_CARE_PROVIDER_SITE_OTHER): Payer: BC Managed Care – PPO | Admitting: Internal Medicine

## 2014-07-19 ENCOUNTER — Encounter: Payer: Self-pay | Admitting: Internal Medicine

## 2014-07-19 VITALS — BP 108/60 | HR 83 | Ht 62.0 in | Wt 165.0 lb

## 2014-07-19 DIAGNOSIS — R0789 Other chest pain: Secondary | ICD-10-CM

## 2014-07-19 DIAGNOSIS — I10 Essential (primary) hypertension: Secondary | ICD-10-CM | POA: Diagnosis not present

## 2014-07-19 NOTE — Assessment & Plan Note (Signed)
She is lost over 20 pounds in the last 4 years. She will be encouraged to continue her weight loss regimen.

## 2014-07-19 NOTE — Progress Notes (Signed)
HPI Jocelyn [name] return today for followup. She is a very pleasant 49 year old woman with a long history of chest pain, and catheterization on 2 different occasions demonstrating no obstructive coronary disease. The patient has had palpitations which occur very infrequently. She exerts herself on a regular basis and has no trouble walking on flat ground. She has trouble walking up 3 flights of stairs, and in a steep grade. She has preserved left ventricular function. She monitors her activity and notes that she walks between 7000 and 12,000 steps per day. Allergies  Allergen Reactions  . Sulfonamide Derivatives     REACTION: GI distress     Current Outpatient Prescriptions  Medication Sig Dispense Refill  . Ascorbic Acid (VITAMIN C) 500 MG tablet Take 500 mg by mouth daily.      Marland Kitchen aspirin 81 MG tablet Take 81 mg by mouth daily.      . Calcium Carbonate (CALCIUM 500 PO) Take 2 tablets by mouth daily.      . canagliflozin (INVOKANA) 100 MG TABS tablet Take 100 mg by mouth daily.    . cetirizine (ZYRTEC) 10 MG tablet Take 10 mg by mouth daily.    . Cyanocobalamin (VITAMIN B 12) 250 MCG LOZG Take 3 tablets by mouth daily.      Marland Kitchen ezetimibe-simvastatin (VYTORIN) 10-80 MG per tablet Take 1 tablet by mouth at bedtime.      . Insulin Infusion Pump KIT as directed.     . insulin lispro (HUMALOG) 100 UNIT/ML injection Inject 46.85 Units into the skin as directed. 1.6 units at 12 am and 6am 1.725 units ,12pm 2.3 units  3pm 2.0  Units  and 8 pm 2.5 units  total of 46.85 units daily-BASIL RATE IN INSULIN PUMP    . metoprolol succinate (TOPROL-XL) 25 MG 24 hr tablet Take 25 mg by mouth daily before breakfast.     . MOVIPREP 100 G SOLR Take 1 kit (200 g total) by mouth once. 1 kit 0  . Multiple Vitamin (MULTIVITAMIN) tablet Take 1 tablet by mouth daily.      . pantoprazole (PROTONIX) 40 MG tablet Take 40 mg by mouth 2 (two) times daily as needed (acid reflux). Takes every morning, can take a second  time if needed for acid    . ramipril (ALTACE) 2.5 MG capsule Take 2.5 mg by mouth at bedtime.    . sertraline (ZOLOFT) 100 MG tablet Take 100 mg by mouth daily before breakfast.    . sucralfate (CARAFATE) 1 G tablet Take 1 g by mouth daily as needed. GAS     No current facility-administered medications for this visit.     Past Medical History  Diagnosis Date  . DM (diabetes mellitus)   . Dyslipidemia   . Chest pain     RECURRENT WITH NEGITIVE CATHERIZATIONS  . Anxiety   . Fatty liver   . GERD (gastroesophageal reflux disease)   . Complication of anesthesia     aspirated and "gained 15pounds, ended up in ICU"  . Pulmonary embolus 2002  . Tachycardia     ROS:   All systems reviewed and negative except as noted in the HPI.   Past Surgical History  Procedure Laterality Date  . Carpal tunnel release      right  . Fibroid tumor      UTERUS SURGERY  . Laparoscopic hysterectomy    . Cholecystectomy  03/24/2012    Procedure: LAPAROSCOPIC CHOLECYSTECTOMY WITH INTRAOPERATIVE CHOLANGIOGRAM;  Surgeon:  Edward Jolly, MD;  Location: [facility];  Service: General;  Laterality: N/A;  . Cardiac catheterization      x2     Family History  Problem Relation Age of Onset  . Colon cancer Neg Hx   . Irritable bowel syndrome Father   . Gallbladder disease Mother   . Gallbladder disease      3 mat. uncles  . Cancer Maternal Grandmother     ovarian     History   Social History  . Marital Status: Single    Spouse Name: N/A  . Number of Children: 0  . Years of Education: N/A   Occupational History  . registered dietician    Social History Main Topics  . Smoking status: Never Smoker   . Smokeless tobacco: Never Used  . Alcohol Use: Yes     Comment: rarely  . Drug Use: No  . Sexual Activity: Not on file   Other Topics Concern  . Not on file   Social History Narrative     BP 108/60 mmHg  Pulse 83  Ht $R'5\' 2"'YG$  (1.575 m)  Wt 165 lb (74.844 kg)  BMI 30.17  kg/m2  Physical Exam:  Well appearing middle-aged woman, NAD HEENT: Unremarkable Neck:  6 cm JVD, no thyromegally Lymphatics:  No adenopathy Back:  No CVA tenderness Lungs:  Clear, with no wheezes, rales, or rhonchi. HEART:  Regular rate rhythm, no murmurs, no rubs, no clicks Abd:  soft, obese, positive bowel sounds, no organomegally, no rebound, no guarding Ext:  2 plus pulses, no edema, no cyanosis, no clubbing Skin:  No rashes no nodules Neuro:  CN II through XII intact, motor grossly intact    Assess/Plan:

## 2014-07-19 NOTE — Assessment & Plan Note (Signed)
Her blood pressure is currently well-controlled. No change in medical therapy.

## 2014-07-19 NOTE — Assessment & Plan Note (Signed)
Her symptoms are noncardiac. We discussed the warning signs which would suggest that her chest discomfort might be cardiac rather than noncardiac in nature. She will undergo watchful waiting for now. We'll hold off on any stress testing, though when I see her back in a year I will consider a stress echo as she is gone approximate 5 years from her last stress test by that time.

## 2014-07-19 NOTE — Patient Instructions (Signed)
Your physician recommends that you continue on your current medications as directed. Please refer to the Current Medication list given to you today.  Your physician wants you to follow-up in: 1 year with Dr. Taylor.  You will receive a reminder letter in the mail two months in advance. If you don't receive a letter, please call our office to schedule the follow-up appointment.  

## 2014-07-28 ENCOUNTER — Encounter: Payer: Self-pay | Admitting: Internal Medicine

## 2014-09-12 ENCOUNTER — Encounter: Payer: BC Managed Care – PPO | Admitting: Internal Medicine

## 2014-09-27 ENCOUNTER — Encounter: Payer: Self-pay | Admitting: Internal Medicine

## 2014-11-06 ENCOUNTER — Ambulatory Visit (AMBULATORY_SURGERY_CENTER): Payer: BC Managed Care – PPO | Admitting: Internal Medicine

## 2014-11-06 ENCOUNTER — Encounter: Payer: Self-pay | Admitting: Internal Medicine

## 2014-11-06 ENCOUNTER — Encounter: Payer: Self-pay | Admitting: *Deleted

## 2014-11-06 VITALS — BP 108/63 | HR 72 | Temp 96.3°F | Resp 19 | Ht 62.0 in | Wt 165.0 lb

## 2014-11-06 DIAGNOSIS — Z1211 Encounter for screening for malignant neoplasm of colon: Secondary | ICD-10-CM | POA: Diagnosis not present

## 2014-11-06 DIAGNOSIS — K219 Gastro-esophageal reflux disease without esophagitis: Secondary | ICD-10-CM

## 2014-11-06 DIAGNOSIS — R1013 Epigastric pain: Secondary | ICD-10-CM | POA: Diagnosis not present

## 2014-11-06 LAB — GLUCOSE, CAPILLARY
GLUCOSE-CAPILLARY: 262 mg/dL — AB (ref 65–99)
GLUCOSE-CAPILLARY: 194 mg/dL — AB (ref 65–99)

## 2014-11-06 MED ORDER — SODIUM CHLORIDE 0.9 % IV SOLN: 500.0000 mL | INTRAVENOUS | Status: DC

## 2014-11-06 NOTE — Patient Instructions (Signed)
Normal exams today. Repeat colonoscopy in 10 years.   YOU HAD AN ENDOSCOPIC PROCEDURE TODAY AT [facility]:   Refer to the procedure report that was given to you for any specific questions about what was found during the examination.  If the procedure report does not answer your questions, please call your gastroenterologist to clarify.  If you requested that your care partner not be given the details of your procedure findings, then the procedure report has been included in a sealed envelope for you to review at your convenience later.  YOU SHOULD EXPECT: Some feelings of bloating in the abdomen. Passage of more gas than usual.  Walking can help get rid of the air that was put into your GI tract during the procedure and reduce the bloating. If you had a lower endoscopy (such as a colonoscopy or flexible sigmoidoscopy) you may notice spotting of blood in your stool or on the toilet paper. If you underwent a bowel prep for your procedure, you may not have a normal bowel movement for a few days.  Please Note:  You might notice some irritation and congestion in your nose or some drainage.  This is from the oxygen used during your procedure.  There is no need for concern and it should clear up in a day or so.  SYMPTOMS TO REPORT IMMEDIATELY:   Following lower endoscopy (colonoscopy or flexible sigmoidoscopy):  Excessive amounts of blood in the stool  Significant tenderness or worsening of abdominal pains  Swelling of the abdomen that is new, acute  Fever of 100F or higher   Following upper endoscopy (EGD)  Vomiting of blood or coffee ground material  New chest pain or pain under the shoulder blades  Painful or persistently difficult swallowing  New shortness of breath  Fever of 100F or higher  Black, tarry-looking stools  For urgent or emergent issues, a gastroenterologist can be reached at any hour by calling [phone].   DIET: Your first meal following the procedure  should be a small meal and then it is ok to progress to your normal diet. Heavy or fried foods are harder to digest and may make you feel nauseous or bloated.  Likewise, meals heavy in dairy and vegetables can increase bloating.  Drink plenty of fluids but you should avoid alcoholic beverages for 24 hours.  ACTIVITY:  You should plan to take it easy for the rest of today and you should NOT DRIVE or use heavy machinery until tomorrow (because of the sedation medicines used during the test).    FOLLOW UP: Our staff will call the number listed on your records the next business day following your procedure to check on you and address any questions or concerns that you may have regarding the information given to you following your procedure. If we do not reach you, we will leave a message.  However, if you are feeling well and you are not experiencing any problems, there is no need to return our call.  We will assume that you have returned to your regular daily activities without incident.  If any biopsies were taken you will be contacted by phone or by letter within the next 1-3 weeks.  Please call us at [phone] if you have not heard about the biopsies in 3 weeks.    SIGNATURES/CONFIDENTIALITY: You and/or your care partner have signed paperwork which will be entered into your electronic medical record.  These signatures attest to the fact that that the  information above on your After Visit Summary has been reviewed and is understood.  Full responsibility of the confidentiality of this discharge information lies with you and/or your care-partner.

## 2014-11-06 NOTE — Progress Notes (Signed)
Patient ID: Jocelyn [name], female   DOB: Jul 22, 1965, 48 y.o.   MRN: [id number] Procedure report mailed to pt/KW

## 2014-11-06 NOTE — Op Note (Signed)
[place]  [address] [place], [id number]   COLONOSCOPY PROCEDURE REPORT  PATIENT: Jocelyn, [name]  MR#: [id number] BIRTHDATE: Sep 02, 1965 , 49  yrs. old GENDER: female ENDOSCOPIST: Eustace Quail, MD REFERRED DD:UKGURKY Tisovec, M.D. PROCEDURE DATE:  11/06/2014 PROCEDURE:   Colonoscopy, screening First Screening Colonoscopy - Avg.  risk and is 50 yrs.  old or older Yes.  Prior Negative Screening - Now for repeat screening. N/A  History of Adenoma - Now for follow-up colonoscopy & has been > or = to 3 yrs.  N/A  Polyps removed today? No Recommend repeat exam, <10 yrs? No ASA CLASS:   Class II INDICATIONS:Screening for colonic neoplasia and Colorectal Neoplasm Risk Assessment for this procedure is average risk. MEDICATIONS: Monitored anesthesia care and Propofol 220 mg IV  DESCRIPTION OF PROCEDURE:   After the risks benefits and alternatives of the procedure were thoroughly explained, informed consent was obtained.  The digital rectal exam revealed no abnormalities of the rectum.   The LB [id number]  endoscope was introduced through the anus and advanced to the cecum, which was identified by both the appendix and ileocecal valve. No adverse events experienced.   The quality of the prep was excellent. (MoviPrep was used)  The instrument was then slowly withdrawn as the colon was fully examined. Estimated blood loss is zero unless otherwise noted in this procedure report.   no images available due to technical difficulties     COLON FINDINGS: A normal appearing cecum, ileocecal valve, and appendiceal orifice were identified.  The ascending, transverse, descending, sigmoid colon, and rectum appeared unremarkable. Retroflexed views revealed no abnormalities. The time to cecum = 2.0 Withdrawal time = 8.7   The scope was withdrawn and the procedure completed. COMPLICATIONS: There were no immediate complications.  ENDOSCOPIC IMPRESSION: 1. Normal  colonoscopy  RECOMMENDATIONS: 1.  Continue current colorectal screening recommendations for "routine risk" patients with a repeat colonoscopy in 10 years. 2.  Upper endoscopy today (please see report)  eSigned:  Eustace Quail, MD 11/06/2014 11:42 AM   cc: The Patient and Domenick Gong, MD

## 2014-11-06 NOTE — Progress Notes (Signed)
A/ox3, pleased with MAC, report to RN 

## 2014-11-06 NOTE — Op Note (Signed)
[place]  [address]. [place], [id number]   ENDOSCOPY PROCEDURE REPORT  PATIENT: Jocelyn, [name]  MR#: [id number] BIRTHDATE: May 08, 1965 , 49  yrs. old GENDER: female ENDOSCOPIST: Eustace Quail, MD REFERRED BY:  Domenick Gong, M.D. PROCEDURE DATE:  11/06/2014 PROCEDURE:  EGD, diagnostic ASA CLASS:     Class II INDICATIONS:  chest pain and epigastric pain. MEDICATIONS: Monitored anesthesia care and Propofol 60 mg IV TOPICAL ANESTHETIC: none  DESCRIPTION OF PROCEDURE: After the risks benefits and alternatives of the procedure were thoroughly explained, informed consent was obtained.  The LB [id number] endoscope was introduced through the mouth and advanced to the second portion of the duodenum , Without limitations.  The instrument was slowly withdrawn as the mucosa was fully examined.      EXAM: The esophagus and gastroesophageal junction were completely normal in appearance.  The stomach was entered and closely examined.The antrum, angularis, and lesser curvature were well visualized, including a retroflexed view of the cardia and fundus. The stomach wall was normally distensable.  The scope passed easily through the pylorus into the duodenum.  Retroflexed views revealed no abnormalities.     The scope was then withdrawn from the patient and the procedure completed.  COMPLICATIONS: There were no immediate complications.  ENDOSCOPIC IMPRESSION: 1. Normal EGD 2. No GI cause for symptoms found or suspected  RECOMMENDATIONS: 1. Continue current medications 2. Resume general medical care with your primary care provider. GI follow-up as needed  REPEAT EXAM:  eSigned:  Eustace Quail, MD 11/06/2014 11:44 AM    CC:The Patient and Domenick Gong, MD

## 2014-11-07 ENCOUNTER — Telehealth: Payer: Self-pay | Admitting: *Deleted

## 2014-11-07 NOTE — Telephone Encounter (Signed)
No answer. Name identifier. Message left to call if questions or concerns. 

## 2014-12-01 ENCOUNTER — Other Ambulatory Visit: Payer: Self-pay | Admitting: Obstetrics and Gynecology

## 2014-12-04 LAB — CYTOLOGY - PAP

## 2015-04-04 ENCOUNTER — Observation Stay (HOSPITAL_COMMUNITY)
Admission: EM | Admit: 2015-04-04 | Discharge: 2015-04-05 | Disposition: A | Discharge status: Home / Self Care | Payer: BC Managed Care – PPO | Attending: Internal Medicine | Admitting: Internal Medicine

## 2015-04-04 ENCOUNTER — Emergency Department (HOSPITAL_COMMUNITY)
Admission: EM | Admit: 2015-04-04 | Discharge: 2015-04-04 | Disposition: A | Discharge status: Nursing Home (Includes ICF) | Payer: BC Managed Care – PPO | Source: Home / Self Care | Attending: Family Medicine | Admitting: Family Medicine

## 2015-04-04 ENCOUNTER — Emergency Department (HOSPITAL_COMMUNITY): Payer: BC Managed Care – PPO

## 2015-04-04 ENCOUNTER — Encounter (HOSPITAL_COMMUNITY): Payer: Self-pay | Admitting: Emergency Medicine

## 2015-04-04 ENCOUNTER — Encounter (HOSPITAL_COMMUNITY): Payer: Self-pay | Admitting: *Deleted

## 2015-04-04 DIAGNOSIS — E785 Hyperlipidemia, unspecified: Secondary | ICD-10-CM | POA: Diagnosis not present

## 2015-04-04 DIAGNOSIS — F419 Anxiety disorder, unspecified: Secondary | ICD-10-CM | POA: Diagnosis not present

## 2015-04-04 DIAGNOSIS — R079 Chest pain, unspecified: Secondary | ICD-10-CM | POA: Diagnosis present

## 2015-04-04 DIAGNOSIS — Z79899 Other long term (current) drug therapy: Secondary | ICD-10-CM | POA: Diagnosis not present

## 2015-04-04 DIAGNOSIS — Z9641 Presence of insulin pump (external) (internal): Secondary | ICD-10-CM

## 2015-04-04 DIAGNOSIS — E109 Type 1 diabetes mellitus without complications: Secondary | ICD-10-CM | POA: Diagnosis not present

## 2015-04-04 DIAGNOSIS — Z7982 Long term (current) use of aspirin: Secondary | ICD-10-CM | POA: Diagnosis not present

## 2015-04-04 DIAGNOSIS — E7800 Pure hypercholesterolemia, unspecified: Secondary | ICD-10-CM | POA: Diagnosis present

## 2015-04-04 DIAGNOSIS — I10 Essential (primary) hypertension: Secondary | ICD-10-CM | POA: Diagnosis present

## 2015-04-04 DIAGNOSIS — R0789 Other chest pain: Secondary | ICD-10-CM

## 2015-04-04 DIAGNOSIS — Z794 Long term (current) use of insulin: Secondary | ICD-10-CM | POA: Insufficient documentation

## 2015-04-04 DIAGNOSIS — Z86711 Personal history of pulmonary embolism: Secondary | ICD-10-CM | POA: Insufficient documentation

## 2015-04-04 DIAGNOSIS — K219 Gastro-esophageal reflux disease without esophagitis: Secondary | ICD-10-CM | POA: Insufficient documentation

## 2015-04-04 LAB — CBC
HCT: 42.5 % (ref 36.0–46.0)
Hemoglobin: 14.2 g/dL (ref 12.0–15.0)
MCH: 30 pg (ref 26.0–34.0)
MCHC: 33.4 g/dL (ref 30.0–36.0)
MCV: 89.7 fL (ref 78.0–100.0)
PLATELETS: 262 10*3/uL (ref 150–400)
RBC: 4.74 MIL/uL (ref 3.87–5.11)
RDW: 12.6 % (ref 11.5–15.5)
WBC: 8.4 10*3/uL (ref 4.0–10.5)

## 2015-04-04 LAB — BASIC METABOLIC PANEL
ANION GAP: 11 (ref 5–15)
BUN: 17 mg/dL (ref 6–20)
CALCIUM: 9.4 mg/dL (ref 8.9–10.3)
CO2: 26 mmol/L (ref 22–32)
CREATININE: 0.86 mg/dL (ref 0.44–1.00)
Chloride: 103 mmol/L (ref 101–111)
Glucose, Bld: 101 mg/dL — ABNORMAL HIGH (ref 65–99)
Potassium: 3.5 mmol/L (ref 3.5–5.1)
SODIUM: 140 mmol/L (ref 135–145)

## 2015-04-04 LAB — I-STAT TROPONIN, ED: TROPONIN I, POC: 0 ng/mL (ref 0.00–0.08)

## 2015-04-04 LAB — POC URINE PREG, ED: Preg Test, Ur: NEGATIVE

## 2015-04-04 LAB — GLUCOSE, CAPILLARY: Glucose-Capillary: 86 mg/dL (ref 65–99)

## 2015-04-04 MED ORDER — FENTANYL CITRATE (PF) 100 MCG/2ML IJ SOLN
25.0000 ug | INTRAMUSCULAR | Status: DC | PRN
  Administered 2015-04-05 (×2): 25 ug via INTRAVENOUS
  Filled 2015-04-04 (×2): qty 2

## 2015-04-04 MED ORDER — GI COCKTAIL ~~LOC~~
ORAL | Status: AC
  Filled 2015-04-04: qty 30

## 2015-04-04 MED ORDER — SERTRALINE HCL 100 MG PO TABS
100.0000 mg | ORAL_TABLET | Freq: Every day | ORAL | Status: DC
  Administered 2015-04-05: 100 mg via ORAL
  Filled 2015-04-04: qty 1

## 2015-04-04 MED ORDER — ENOXAPARIN SODIUM 40 MG/0.4ML ~~LOC~~ SOLN
40.0000 mg | SUBCUTANEOUS | Status: DC
  Administered 2015-04-05: 40 mg via SUBCUTANEOUS
  Filled 2015-04-04: qty 0.4

## 2015-04-04 MED ORDER — LORATADINE 10 MG PO TABS
10.0000 mg | ORAL_TABLET | Freq: Every day | ORAL | Status: DC
Start: 2015-04-05 — End: 2015-04-05
  Administered 2015-04-05: 10 mg via ORAL
  Filled 2015-04-04: qty 1

## 2015-04-04 MED ORDER — GI COCKTAIL ~~LOC~~
30.0000 mL | Freq: Once | ORAL | Status: AC
  Administered 2015-04-04: 30 mL via ORAL

## 2015-04-04 MED ORDER — IOHEXOL 350 MG/ML SOLN
100.0000 mL | Freq: Once | INTRAVENOUS | Status: AC | PRN
  Administered 2015-04-04: 100 mL via INTRAVENOUS

## 2015-04-04 MED ORDER — ONDANSETRON HCL 4 MG/2ML IJ SOLN: 4.0000 mg | Freq: Four times a day (QID) | INTRAMUSCULAR | Status: DC | PRN

## 2015-04-04 MED ORDER — EZETIMIBE 10 MG PO TABS
10.0000 mg | ORAL_TABLET | Freq: Every day | ORAL | Status: DC
  Filled 2015-04-04: qty 1

## 2015-04-04 MED ORDER — DIPHENHYDRAMINE HCL 50 MG/ML IJ SOLN
25.0000 mg | Freq: Once | INTRAMUSCULAR | Status: AC
  Administered 2015-04-04: 25 mg via INTRAVENOUS
  Filled 2015-04-04: qty 1

## 2015-04-04 MED ORDER — INSULIN PUMP
SUBCUTANEOUS | Status: DC
  Administered 2015-04-05 (×2): via SUBCUTANEOUS
  Filled 2015-04-04: qty 1

## 2015-04-04 MED ORDER — ACETAMINOPHEN 325 MG PO TABS: 650.0000 mg | ORAL_TABLET | ORAL | Status: DC | PRN

## 2015-04-04 MED ORDER — PANTOPRAZOLE SODIUM 40 MG PO TBEC
40.0000 mg | DELAYED_RELEASE_TABLET | Freq: Every day | ORAL | Status: DC
  Administered 2015-04-05: 40 mg via ORAL
  Filled 2015-04-04: qty 1

## 2015-04-04 MED ORDER — ATORVASTATIN CALCIUM 40 MG PO TABS: 40.0000 mg | ORAL_TABLET | Freq: Every day | ORAL | Status: DC

## 2015-04-04 MED ORDER — NITROGLYCERIN 0.4 MG SL SUBL
0.4000 mg | SUBLINGUAL_TABLET | SUBLINGUAL | Status: DC | PRN
  Administered 2015-04-04: 0.4 mg via SUBLINGUAL
  Filled 2015-04-04 (×2): qty 1

## 2015-04-04 MED ORDER — ATORVASTATIN CALCIUM 40 MG PO TABS
40.0000 mg | ORAL_TABLET | Freq: Every day | ORAL | Status: DC
  Filled 2015-04-04: qty 1

## 2015-04-04 MED ORDER — METOPROLOL SUCCINATE ER 25 MG PO TB24
25.0000 mg | ORAL_TABLET | Freq: Every day | ORAL | Status: DC
  Administered 2015-04-05: 25 mg via ORAL
  Filled 2015-04-04: qty 1

## 2015-04-04 MED ORDER — SODIUM CHLORIDE 0.9 % IV SOLN
Freq: Once | INTRAVENOUS | Status: AC
  Administered 2015-04-04: 17:00:00 via INTRAVENOUS

## 2015-04-04 MED ORDER — ASPIRIN 81 MG PO CHEW
324.0000 mg | CHEWABLE_TABLET | Freq: Once | ORAL | Status: AC
  Administered 2015-04-04: 324 mg via ORAL
  Filled 2015-04-04: qty 4

## 2015-04-04 MED ORDER — RAMIPRIL 2.5 MG PO CAPS
2.5000 mg | ORAL_CAPSULE | Freq: Every day | ORAL | Status: DC
  Administered 2015-04-05: 2.5 mg via ORAL
  Filled 2015-04-04: qty 1

## 2015-04-04 MED ORDER — ASPIRIN EC 81 MG PO TBEC: 81.0000 mg | DELAYED_RELEASE_TABLET | Freq: Every day | ORAL | Status: DC

## 2015-04-04 MED ORDER — ONDANSETRON HCL 4 MG/2ML IJ SOLN
4.0000 mg | Freq: Once | INTRAMUSCULAR | Status: AC
  Administered 2015-04-04: 4 mg via INTRAVENOUS
  Filled 2015-04-04: qty 2

## 2015-04-04 MED ORDER — SUCRALFATE 1 G PO TABS: 1.0000 g | ORAL_TABLET | Freq: Every day | ORAL | Status: DC | PRN

## 2015-04-04 MED ORDER — MORPHINE SULFATE (PF) 4 MG/ML IV SOLN
6.0000 mg | Freq: Once | INTRAVENOUS | Status: AC
  Administered 2015-04-04: 6 mg via INTRAVENOUS
  Filled 2015-04-04: qty 2

## 2015-04-04 NOTE — H&P (Signed)
PCP:  Haywood Pao, MD  [facility] Jocelyn [name]   Referring provider [place]   Chief Complaint:  Chest pain  HPI: Jocelyn [name] is a 49 y.o. female   has a past medical history of DM (diabetes mellitus) ([place]); Dyslipidemia; Chest pain; Anxiety; Fatty liver; GERD (gastroesophageal reflux disease); Complication of anesthesia; Pulmonary embolus ([place]) (2002); and Tachycardia.   Presented with substernal chest pain radiating to jaw and left arm. Patient woke up with this in the morning This was not associated with nausea vomiting diarrhea or diaphoresis. Not shortness of breath. In emergency department troponin is 0 no EKG changes suggestive of ischemia Given patient's history of diabetes and hyperlipidemia hospitalist was called for admission She continues to have mild chest pin describes it as pressure and sharp.  She is sp 2 negative cardiac catheterizations with hx of similar chest pain in the past.  Reports last cardiac cath was in 2005, reports two stress tests last in 2014 both unremarkable.  Patient has hx of Type 1 diabetes diagnosed at age of 17  Patient had past hx of PE 6 years ago perioperatively CTA today showed no PE.   Hospitalist was called for admission for  chest pain evaluation  Review of Systems:    Pertinent positives include: chest pain,   Constitutional:  No weight loss, night sweats, Fevers, chills, fatigue, weight loss  HEENT:  No headaches, Difficulty swallowing,Tooth/dental problems,Sore throat,  No sneezing, itching, ear ache, nasal congestion, post nasal drip,  Cardio-vascular:  No Orthopnea, PND, anasarca, dizziness, palpitations.no Bilateral lower extremity swelling  GI:  No heartburn, indigestion, abdominal pain, nausea, vomiting, diarrhea, change in bowel habits, loss of appetite, melena, blood in stool, hematemesis Resp:  no shortness of breath at rest. No dyspnea on exertion, No excess mucus, no productive cough, No non-productive  cough, No coughing up of blood.No change in color of mucus.No wheezing. Skin:  no rash or lesions. No jaundice GU:  no dysuria, change in color of urine, no urgency or frequency. No straining to urinate.  No flank pain.  Musculoskeletal:  No joint pain or no joint swelling. No decreased range of motion. No back pain.  Psych:  No change in mood or affect. No depression or anxiety. No memory loss.  Neuro: no localizing neurological complaints, no tingling, no weakness, no double vision, no gait abnormality, no slurred speech, no confusion  Otherwise ROS are negative except for above, 10 systems were reviewed  Past Medical History: Past Medical History  Diagnosis Date  . DM (diabetes mellitus) ([place])   . Dyslipidemia   . Chest pain     RECURRENT WITH NEGITIVE CATHERIZATIONS  . Anxiety   . Fatty liver   . GERD (gastroesophageal reflux disease)   . Complication of anesthesia     aspirated and "gained 15pounds, ended up in ICU"  . Pulmonary embolus ([place]) 2002  . Tachycardia    Past Surgical History  Procedure Laterality Date  . Carpal tunnel release      right  . Fibroid tumor      UTERUS SURGERY  . Laparoscopic hysterectomy    . Cholecystectomy  03/24/2012    Procedure: LAPAROSCOPIC CHOLECYSTECTOMY WITH INTRAOPERATIVE CHOLANGIOGRAM;  Surgeon: Edward Jolly, MD;  Location: [facility];  Service: General;  Laterality: N/A;  . Cardiac catheterization      x2     Medications: Prior to Admission medications   Medication Sig Start Date End Date Taking? Authorizing Provider  Ascorbic Acid (VITAMIN  C) 500 MG tablet Take 500 mg by mouth daily.     Yes Historical Provider, MD  aspirin 81 MG tablet Take 81 mg by mouth at bedtime.    Yes Historical Provider, MD  Calcium Carbonate (CALCIUM 500 PO) Take 2 tablets by mouth daily.     Yes Historical Provider, MD  canagliflozin (INVOKANA) 100 MG TABS tablet Take 100 mg by mouth daily.   Yes Historical Provider, MD  cetirizine (ZYRTEC) 10  MG tablet Take 10 mg by mouth at bedtime.    Yes Historical Provider, MD  Cyanocobalamin (VITAMIN B 12) 250 MCG LOZG Take 2 tablets by mouth daily.    Yes Historical Provider, MD  Insulin Infusion Pump KIT as directed.    Yes Historical Provider, MD  insulin lispro (HUMALOG) 100 UNIT/ML injection Inject 46.85 Units into the skin as directed. 1.6 units at 12 am and 6am 1.725 units ,12pm 2.3 units  3pm 2.0  Units  and 8 pm 2.5 units  total of 46.85 units daily-BASIL RATE IN INSULIN PUMP   Yes Historical Provider, MD  metoprolol succinate (TOPROL-XL) 25 MG 24 hr tablet Take 25 mg by mouth daily before breakfast.    Yes Historical Provider, MD  Multiple Vitamin (MULTIVITAMIN) tablet Take 1 tablet by mouth daily.     Yes Historical Provider, MD  pantoprazole (PROTONIX) 40 MG tablet Take 40 mg by mouth daily. Takes every morning, can take a second time if needed for acid   Yes Historical Provider, MD  ramipril (ALTACE) 2.5 MG capsule Take 2.5 mg by mouth at bedtime.   Yes Historical Provider, MD  sertraline (ZOLOFT) 100 MG tablet Take 100 mg by mouth daily before breakfast.   Yes Historical Provider, MD  simvastatin (ZOCOR) 80 MG tablet Take 80 mg by mouth daily at 6 PM.  11/04/14  Yes Historical Provider, MD  sucralfate (CARAFATE) 1 G tablet Take 1 g by mouth daily as needed (stomach upset). GAS   Yes Historical Provider, MD  ZETIA 10 MG tablet TAKE 1 TAB BY MOUTH ONCE DAILY AT BEDTIME 10/12/14  Yes Historical Provider, MD    Allergies:   Allergies  Allergen Reactions  . Pumpkin Flavor Anaphylaxis    Squash also  . Salmon [Fish Allergy] Anaphylaxis  . Sulfonamide Derivatives     REACTION: GI distress    Social History:  Ambulatory  independently   Lives at home alone,         reports that she has never smoked. She has never used smokeless tobacco. She reports that she drinks alcohol. She reports that she does not use illicit drugs.    Family History: family history includes Cancer in her  maternal grandmother; Gallbladder disease in her mother; Irritable bowel syndrome in her father. There is no history of Colon cancer.    Physical Exam: Patient Vitals for the past 24 hrs:  BP Temp Temp src Pulse Resp SpO2  04/04/15 2100 (!) 104/54 mmHg - - 82 15 100 %  04/04/15 2037 109/59 mmHg - - 78 13 94 %  04/04/15 1945 (!) 108/53 mmHg - - 83 15 99 %  04/04/15 1930 109/56 mmHg - - 85 13 100 %  04/04/15 1920 112/62 mmHg - - 79 15 100 %  04/04/15 1845 118/58 mmHg - - 84 12 99 %  04/04/15 1834 125/60 mmHg - - 95 - -  04/04/15 1830 127/66 mmHg - - 94 20 98 %  04/04/15 1825 118/60 mmHg - - 83 20  99 %  04/04/15 1734 128/65 mmHg 98.3 F (36.8 C) Oral 85 16 100 %    1. General:  in No Acute distress 2. Psychological: Alert and  Oriented 3. Head/ENT:   Moist   Mucous Membranes                          Head Non traumatic, neck supple                          Normal  Dentition 4. SKIN:  decreased Skin turgor,  Skin clean Dry and intact no rash 5. Heart: Regular rate and rhythm no Murmur, Rub or gallop 6. Lungs: Clear to auscultation bilaterally, no wheezes or crackles   7. Abdomen: Soft, non-tender, Non distended 8. Lower extremities: no clubbing, cyanosis, or edema 9. Neurologically Grossly intact, moving all 4 extremities equally 10. MSK: Normal range of motion, mild tenderness to palpation over chest  body mass index is unknown because there is no weight on file.   Labs on Admission:   Results for orders placed or performed during the hospital encounter of 04/04/15 (from the past 24 hour(s))  Basic metabolic panel     Status: Abnormal   Collection Time: 04/04/15  5:50 PM  Result Value Ref Range   Sodium 140 135 - 145 mmol/L   Potassium 3.5 3.5 - 5.1 mmol/L   Chloride 103 101 - 111 mmol/L   CO2 26 22 - 32 mmol/L   Glucose, Bld 101 (H) 65 - 99 mg/dL   BUN 17 6 - 20 mg/dL   Creatinine, Ser 0.86 0.44 - 1.00 mg/dL   Calcium 9.4 8.9 - 10.3 mg/dL   GFR calc non Af Amer >60 >60  mL/min   GFR calc Af Amer >60 >60 mL/min   Anion gap 11 5 - 15  CBC     Status: None   Collection Time: 04/04/15  5:50 PM  Result Value Ref Range   WBC 8.4 4.0 - 10.5 K/uL   RBC 4.74 3.87 - 5.11 MIL/uL   Hemoglobin 14.2 12.0 - 15.0 g/dL   HCT 42.5 36.0 - 46.0 %   MCV 89.7 78.0 - 100.0 fL   MCH 30.0 26.0 - 34.0 pg   MCHC 33.4 30.0 - 36.0 g/dL   RDW 12.6 11.5 - 15.5 %   Platelets 262 150 - 400 K/uL  I-stat troponin, ED (not at [facility], [facility])     Status: None   Collection Time: 04/04/15  5:51 PM  Result Value Ref Range   Troponin i, poc 0.00 0.00 - 0.08 ng/mL   Comment 3          POC Urine Pregnancy, ED (do NOT order at [facility])     Status: None   Collection Time: 04/04/15  6:29 PM  Result Value Ref Range   Preg Test, Ur NEGATIVE NEGATIVE    UA not obtained  No results found for: HGBA1C  CrCl cannot be calculated (Unknown ideal weight.).  BNP (last 3 results) No results for input(s): PROBNP in the last 8760 hours.  Other results:  I have pearsonaly reviewed this: ECG REPORT  Rate: 81  Rhythm: Normal sinus rhythm ST&T Change: No ischemic changes QTC 438  There were no vitals filed for this visit.   Cultures:    Component Value Date/Time   SDES BLOOD RIGHT HAND 12/20/2009 0715   SPECREQUEST BOTTLES DRAWN AEROBIC AND ANAEROBIC 10CC  EACH 12/20/2009 0715   CULT NO GROWTH 5 DAYS 12/20/2009 0715   REPTSTATUS 12/26/2009 FINAL 12/20/2009 0715     Radiological Exams on Admission: Dg Chest 2 View  04/04/2015  CLINICAL DATA:  Central chest pain starting this morning EXAM: CHEST  2 VIEW COMPARISON:  06/14/2014 FINDINGS: Cardiomediastinal silhouette is stable. No acute infiltrate or pleural effusion. No pulmonary edema. Stable degenerative changes thoracic spine. IMPRESSION: No active cardiopulmonary disease. Again noted degenerative changes thoracic spine. Electronically Signed   By: Lahoma Crocker M.D.   On: 04/04/2015 18:23   Ct Angio Chest Pe W/cm &/or Wo Cm  04/04/2015  CLINICAL  DATA:  Acute mid sternal chest pain radiating to the back and shoulders. EXAM: CT ANGIOGRAPHY CHEST WITH CONTRAST TECHNIQUE: Multidetector CT imaging of the chest was performed using the standard protocol during bolus administration of intravenous contrast. Multiplanar CT image reconstructions and MIPs were obtained to evaluate the vascular anatomy. CONTRAST:  150m OMNIPAQUE IOHEXOL 350 MG/ML SOLN COMPARISON:  05/21/2012, 04/04/2015 FINDINGS: Mediastinum/Lymph Nodes: No pulmonary emboli or thoracic aortic dissection identified. No masses or pathologically enlarged lymph nodes identified. Lungs/Pleura: Minor dependent basilar atelectasis. No focal pneumonia, collapse or consolidation. No interstitial process or edema. No pneumothorax. Trachea and central airways remain patent. Upper abdomen: No acute findings. Musculoskeletal: No chest wall abnormality or soft tissue asymmetry. Minor thoracic endplate degenerative changes. No acute osseous finding. Intact sternum. Review of the MIP images confirms the above findings. IMPRESSION: No significant acute pulmonary embolus by CTA. No acute intra thoracic process. Minor bibasilar atelectasis. Electronically Signed   By: MJerilynn Mages  Shick M.D.   On: 04/04/2015 20:20    Chart has been reviewed  Family at  Bedside  plan of care was discussed with mother PDelberta [phone] Assessment/Plan  49year old female with history of diabetes mellitus type 1, hypertension hypercholesterolemia presents with chest pain History of a typical chest pain in the past with negative cardiac workup in the past.  Present on Admission:  . Chest pain - HEART score 3 - given risk factors will admit, monitor on telemetry, cycle cardiac enzymes, obtain serial ECG. Further risk stratify with lipid panel, hgA1C, obtain TSH. Make sure patient is on Aspirin. Further treatment based on the currently pending results. Will notify cardiology regarding the patient.  . Essential hypertension, benign -  continue home medications currently stable  . Hypercholesteremia  - stable continue home medication  . [place] 1 [place] - will order an insulin pump to be continued will need diabetic coordinator to help with management   Prophylaxis:   Lovenox   CODE STATUS:  FULL CODE   as per patient   Disposition:   To home once workup is complete and patient is stable  Other plan as per orders.  I have spent a total of 55 min on this admission  Jocelyn [name] 04/04/2015, 9:17 PM  [facility]  Pager [phone]  after 2 AM please page floor coverage PA If 7AM-7PM, please contact the day team taking care of the patient  [email or web address]  Password [email or web address]

## 2015-04-04 NOTE — ED Notes (Signed)
Pt sent from [facility] for eval of midsternal cp radiating to back and R shoulder blade. sts she has had some tingling in L arm radiating to L jaw as well. No relief from GI cocktail- pain 7/10. Pt insulin dependent. cng 142. Hx GERD, anxiety and PE. Nothing makes pain worse or better. ekg- NSR

## 2015-04-04 NOTE — ED Notes (Signed)
Pt  Reports     Symptoms       Of   Chest  Pain   With   Burning  l  Arm   Radiating  To  Arm      With  Symptoms             That  Started  Today          Has   A       History  Of  gerd

## 2015-04-04 NOTE — ED Provider Notes (Addendum)
CSN: [id number]     Arrival date & time 04/04/15  1524 History   First MD Initiated Contact with Patient 04/04/15 1549     Chief Complaint  Patient presents with  . Chest Pain   (Consider location/radiation/quality/duration/timing/severity/associated sxs/prior Treatment) Patient is a 49 y.o. female presenting with chest pain. The history is provided by the patient.  Chest Pain Pain location:  Substernal area Pain quality: pressure   Pain radiates to:  L jaw and L arm Pain radiates to the back: yes   Pain severity:  Moderate (8/10 at max , now 6/10.) Onset quality:  Sudden (awoke with pain this am.) Progression:  Partially resolved Chronicity:  New Relieved by:  None tried Worsened by:  Nothing tried Ineffective treatments:  None tried Associated symptoms: heartburn   Associated symptoms: no abdominal pain, no dizziness, no fever, no lower extremity edema, no palpitations, no shortness of breath and not vomiting   Risk factors: diabetes mellitus and high cholesterol   Risk factors: no smoking   Risk factors comment:  Had nl endo and colonoscopy in June by dr Henrene Pastor.   Past Medical History  Diagnosis Date  . DM (diabetes mellitus) ([place])   . Dyslipidemia   . Chest pain     RECURRENT WITH NEGITIVE CATHERIZATIONS  . Anxiety   . Fatty liver   . GERD (gastroesophageal reflux disease)   . Complication of anesthesia     aspirated and "gained 15pounds, ended up in ICU"  . Pulmonary embolus ([place]) 2002  . Tachycardia    Past Surgical History  Procedure Laterality Date  . Carpal tunnel release      right  . Fibroid tumor      UTERUS SURGERY  . Laparoscopic hysterectomy    . Cholecystectomy  03/24/2012    Procedure: LAPAROSCOPIC CHOLECYSTECTOMY WITH INTRAOPERATIVE CHOLANGIOGRAM;  Surgeon: Edward Jolly, MD;  Location: [facility];  Service: General;  Laterality: N/A;  . Cardiac catheterization      x2   Family History  Problem Relation Age of Onset  . Colon cancer Neg Hx    . Irritable bowel syndrome Father   . Gallbladder disease Mother   . Gallbladder disease      3 mat. uncles  . Cancer Maternal Grandmother     ovarian   Social History  Substance Use Topics  . Smoking status: Never Smoker   . Smokeless tobacco: Never Used  . Alcohol Use: Yes     Comment: rarely   OB History    No data available     Review of Systems  Constitutional: Negative.  Negative for fever.  HENT: Negative.   Respiratory: Negative.  Negative for shortness of breath.   Cardiovascular: Positive for chest pain. Negative for palpitations and leg swelling.  Gastrointestinal: Positive for heartburn. Negative for vomiting and abdominal pain.  Neurological: Negative for dizziness.  All other systems reviewed and are negative.   Allergies  Sulfonamide derivatives  Home Medications   Prior to Admission medications   Medication Sig Start Date End Date Taking? Authorizing Provider  Ascorbic Acid (VITAMIN C) 500 MG tablet Take 500 mg by mouth daily.      Historical Provider, MD  aspirin 81 MG tablet Take 81 mg by mouth daily.      Historical Provider, MD  Calcium Carbonate (CALCIUM 500 PO) Take 2 tablets by mouth daily.      Historical Provider, MD  canagliflozin (INVOKANA) 100 MG TABS tablet Take 100 mg by mouth daily.  Historical Provider, MD  cetirizine (ZYRTEC) 10 MG tablet Take 10 mg by mouth daily.    Historical Provider, MD  Cyanocobalamin (VITAMIN B 12) 250 MCG LOZG Take 3 tablets by mouth daily.      Historical Provider, MD  ezetimibe-simvastatin (VYTORIN) 10-80 MG per tablet Take 1 tablet by mouth at bedtime.      Historical Provider, MD  Insulin Infusion Pump KIT as directed.     Historical Provider, MD  insulin lispro (HUMALOG) 100 UNIT/ML injection Inject 46.85 Units into the skin as directed. 1.6 units at 12 am and 6am 1.725 units ,12pm 2.3 units  3pm 2.0  Units  and 8 pm 2.5 units  total of 46.85 units daily-BASIL RATE IN INSULIN PUMP    Historical Provider, MD   metoprolol succinate (TOPROL-XL) 25 MG 24 hr tablet Take 25 mg by mouth daily before breakfast.     Historical Provider, MD  Multiple Vitamin (MULTIVITAMIN) tablet Take 1 tablet by mouth daily.      Historical Provider, MD  pantoprazole (PROTONIX) 40 MG tablet Take 40 mg by mouth 2 (two) times daily as needed (acid reflux). Takes every morning, can take a second time if needed for acid    Historical Provider, MD  ramipril (ALTACE) 2.5 MG capsule Take 2.5 mg by mouth at bedtime.    Historical Provider, MD  sertraline (ZOLOFT) 100 MG tablet Take 100 mg by mouth daily before breakfast.    Historical Provider, MD  simvastatin (ZOCOR) 80 MG tablet  11/04/14   Historical Provider, MD  sucralfate (CARAFATE) 1 G tablet Take 1 g by mouth daily as needed. GAS    Historical Provider, MD  ZETIA 10 MG tablet TAKE 1 TAB BY MOUTH ONCE DAILY. 10/12/14   Historical Provider, MD   Meds Ordered and Administered this Visit   Medications  gi cocktail (Maalox,Lidocaine,Donnatal) (not administered)  0.9 %  sodium chloride infusion (not administered)    BP 135/72 mmHg  Pulse 91  Temp(Src) 98.1 F (36.7 C) (Oral)  Resp 18  SpO2 100% No data found.   Physical Exam  Constitutional: She is oriented to person, place, and time. She appears well-developed and well-nourished. No distress.  HENT:  Mouth/Throat: Oropharynx is clear and moist.  Eyes: Conjunctivae are normal.  Neck: Normal range of motion. Neck supple.  Cardiovascular: Normal heart sounds and intact distal pulses.   Pulmonary/Chest: Effort normal and breath sounds normal. No respiratory distress.  Abdominal: Soft. Bowel sounds are normal. There is no tenderness.  Musculoskeletal: She exhibits no edema.  Lymphadenopathy:    She has no cervical adenopathy.  Neurological: She is alert and oriented to person, place, and time.  Skin: Skin is warm and dry.  Nursing note and vitals reviewed.   ED Course  Procedures (including critical care  time)  Labs Review Labs Reviewed - No data to display  Imaging Review No results found.   Visual Acuity Review  Right Eye Distance:   Left Eye Distance:   Bilateral Distance:    Right Eye Near:   Left Eye Near:    Bilateral Near:     ED ECG REPORT   Date: 04/04/2015  Rate: 93  Rhythm: normal sinus rhythm  QRS Axis: normal  Intervals: normal  ST/T Wave abnormalities: normal  Conduction Disutrbances:none  Narrative Interpretation:   Old EKG Reviewed: none available  I have personally reviewed the EKG tracing and agree with the computerized printout as noted.     MDM  1. Chest pain, atypical    Sent for cp eval in insulin dep diabetic with hyperlipidemia, with radiation to neck and left arm, diaphoresis.    Billy Fischer, MD 04/04/15 [place], MD 04/04/15 [phone]

## 2015-04-04 NOTE — ED Notes (Signed)
Pt  Placed  On  Cardiac  Monitor  Nasal     02   At  2  l  /   Min

## 2015-04-04 NOTE — ED Provider Notes (Signed)
CSN: [id number]     Arrival date & time 04/04/15  1731 History   First MD Initiated Contact with Patient 04/04/15 1740     Chief Complaint  Patient presents with  . Chest Pain     (Consider location/radiation/quality/duration/timing/severity/associated sxs/prior Treatment) Patient is a 48 y.o. female presenting with chest pain. The history is provided by the patient.  Chest Pain Pain location:  Substernal area Pain quality: pressure   Pain radiates to:  L jaw, L arm and mid back Pain radiates to the back: yes   Pain severity:  Moderate Onset quality:  Gradual Duration:  1 day Timing:  Constant Progression:  Unchanged Chronicity:  New Context: at rest   Relieved by:  Nothing Worsened by:  Nothing tried Ineffective treatments:  Rest, oxygen and antacids (GI cocktail at urgent care) Associated symptoms: nausea and shortness of breath   Associated symptoms: no abdominal pain, no AICD problem, no altered mental status, no anorexia, no anxiety, no back pain, no claudication, no cough, no diaphoresis, no dizziness, no dysphagia, no fatigue, no fever, no headache, no heartburn, no lower extremity edema, no near-syncope, no numbness, no orthopnea, no palpitations, no syncope, not vomiting and no weakness   Risk factors: diabetes mellitus, high cholesterol, hypertension and prior DVT/PE   Risk factors: no coronary artery disease, not female, not pregnant and no smoking     Past Medical History  Diagnosis Date  . DM (diabetes mellitus) ([facility])   . Dyslipidemia   . Chest pain     RECURRENT WITH NEGITIVE CATHERIZATIONS  . Anxiety   . Fatty liver   . GERD (gastroesophageal reflux disease)   . Complication of anesthesia     aspirated and "gained 15pounds, ended up in ICU"  . Pulmonary embolus ([facility]) 2002  . Tachycardia    Past Surgical History  Procedure Laterality Date  . Carpal tunnel release      right  . Fibroid tumor      UTERUS SURGERY  . Laparoscopic hysterectomy    .  Cholecystectomy  03/24/2012    Procedure: LAPAROSCOPIC CHOLECYSTECTOMY WITH INTRAOPERATIVE CHOLANGIOGRAM;  Surgeon: Mariella Saa, MD;  Location: [facility];  Service: General;  Laterality: N/A;  . Cardiac catheterization      x2   Family History  Problem Relation Age of Onset  . Colon cancer Neg Hx   . Irritable bowel syndrome Father   . Gallbladder disease Mother   . Gallbladder disease      3 mat. uncles  . Cancer Maternal Grandmother     ovarian   Social History  Substance Use Topics  . Smoking status: Never Smoker   . Smokeless tobacco: Never Used  . Alcohol Use: Yes     Comment: rarely   OB History    No data available     Review of Systems  Constitutional: Negative for fever, diaphoresis and fatigue.  HENT: Negative for trouble swallowing.   Eyes: Negative for pain and visual disturbance.  Respiratory: Positive for chest tightness and shortness of breath. Negative for cough.   Cardiovascular: Positive for chest pain. Negative for palpitations, orthopnea, claudication, syncope and near-syncope.  Gastrointestinal: Positive for nausea. Negative for heartburn, vomiting, abdominal pain, diarrhea, constipation and anorexia.  Genitourinary: Negative for dysuria.  Musculoskeletal: Negative for back pain.  Neurological: Negative for dizziness, weakness, numbness and headaches.  Psychiatric/Behavioral: Negative for confusion.      Allergies  Pumpkin flavor; Salmon; and Sulfonamide derivatives  Home Medications   Prior to  Admission medications   Medication Sig Start Date End Date Taking? Authorizing Provider  Ascorbic Acid (VITAMIN C) 500 MG tablet Take 500 mg by mouth daily.     Yes Historical Provider, MD  aspirin 81 MG tablet Take 81 mg by mouth at bedtime.    Yes Historical Provider, MD  Calcium Carbonate (CALCIUM 500 PO) Take 2 tablets by mouth daily.     Yes Historical Provider, MD  canagliflozin (INVOKANA) 100 MG TABS tablet Take 100 mg by mouth daily.   Yes  Historical Provider, MD  cetirizine (ZYRTEC) 10 MG tablet Take 10 mg by mouth at bedtime.    Yes Historical Provider, MD  Cyanocobalamin (VITAMIN B 12) 250 MCG LOZG Take 2 tablets by mouth daily.    Yes Historical Provider, MD  Insulin Infusion Pump KIT as directed.    Yes Historical Provider, MD  insulin lispro (HUMALOG) 100 UNIT/ML injection Inject 46.85 Units into the skin as directed. 1.6 units at 12 am and 6am 1.725 units ,12pm 2.3 units  3pm 2.0  Units  and 8 pm 2.5 units  total of 46.85 units daily-BASIL RATE IN INSULIN PUMP   Yes Historical Provider, MD  metoprolol succinate (TOPROL-XL) 25 MG 24 hr tablet Take 25 mg by mouth daily before breakfast.    Yes Historical Provider, MD  Multiple Vitamin (MULTIVITAMIN) tablet Take 1 tablet by mouth daily.     Yes Historical Provider, MD  pantoprazole (PROTONIX) 40 MG tablet Take 40 mg by mouth daily. Takes every morning, can take a second time if needed for acid   Yes Historical Provider, MD  ramipril (ALTACE) 2.5 MG capsule Take 2.5 mg by mouth at bedtime.   Yes Historical Provider, MD  sertraline (ZOLOFT) 100 MG tablet Take 100 mg by mouth daily before breakfast.   Yes Historical Provider, MD  simvastatin (ZOCOR) 80 MG tablet Take 80 mg by mouth daily at 6 PM.  11/04/14  Yes Historical Provider, MD  sucralfate (CARAFATE) 1 G tablet Take 1 g by mouth daily as needed (stomach upset). GAS   Yes Historical Provider, MD  ZETIA 10 MG tablet TAKE 1 TAB BY MOUTH ONCE DAILY AT BEDTIME 10/12/14  Yes Historical Provider, MD   BP 104/54 mmHg  Pulse 82  Temp(Src) 98.3 F (36.8 C) (Oral)  Resp 15  SpO2 100% Physical Exam  Constitutional: She is oriented to person, place, and time. She appears well-developed and well-nourished. She does not appear ill. No distress.  HENT:  Head: Normocephalic and atraumatic.  Eyes: Conjunctivae and EOM are normal. Pupils are equal, round, and reactive to light. Right eye exhibits no discharge. Left eye exhibits no  discharge.  Neck: Normal range of motion. Neck supple.  Cardiovascular: Normal rate, regular rhythm and normal heart sounds.  Exam reveals no gallop and no friction rub.   No murmur heard. Pulmonary/Chest: Effort normal and breath sounds normal. No respiratory distress. She has no wheezes. She has no rales. She exhibits no tenderness.  Abdominal: Soft. Bowel sounds are normal. She exhibits no distension and no mass. There is no tenderness. There is no rebound and no guarding.  Neurological: She is alert and oriented to person, place, and time. No cranial nerve deficit. Coordination normal.  Skin: Skin is warm. No rash noted. She is not diaphoretic. No erythema.     Psychiatric: She has a normal mood and affect. Her speech is normal.    ED Course  Procedures (including critical care time) Labs Review Labs Reviewed  BASIC METABOLIC PANEL - Abnormal; Notable for the following:    Glucose, Bld 101 (*)    All other components within normal limits  CBC  I-STAT TROPOININ, ED  POC URINE PREG, ED    Imaging Review Dg Chest 2 View  04/04/2015  CLINICAL DATA:  Central chest pain starting this morning EXAM: CHEST  2 VIEW COMPARISON:  06/14/2014 FINDINGS: Cardiomediastinal silhouette is stable. No acute infiltrate or pleural effusion. No pulmonary edema. Stable degenerative changes thoracic spine. IMPRESSION: No active cardiopulmonary disease. Again noted degenerative changes thoracic spine. Electronically Signed   By: Lahoma Crocker M.D.   On: 04/04/2015 18:23   Ct Angio Chest Pe W/cm &/or Wo Cm  04/04/2015  CLINICAL DATA:  Acute mid sternal chest pain radiating to the back and shoulders. EXAM: CT ANGIOGRAPHY CHEST WITH CONTRAST TECHNIQUE: Multidetector CT imaging of the chest was performed using the standard protocol during bolus administration of intravenous contrast. Multiplanar CT image reconstructions and MIPs were obtained to evaluate the vascular anatomy. CONTRAST:  169m OMNIPAQUE IOHEXOL 350  MG/ML SOLN COMPARISON:  05/21/2012, 04/04/2015 FINDINGS: Mediastinum/Lymph Nodes: No pulmonary emboli or thoracic aortic dissection identified. No masses or pathologically enlarged lymph nodes identified. Lungs/Pleura: Minor dependent basilar atelectasis. No focal pneumonia, collapse or consolidation. No interstitial process or edema. No pneumothorax. Trachea and central airways remain patent. Upper abdomen: No acute findings. Musculoskeletal: No chest wall abnormality or soft tissue asymmetry. Minor thoracic endplate degenerative changes. No acute osseous finding. Intact sternum. Review of the MIP images confirms the above findings. IMPRESSION: No significant acute pulmonary embolus by CTA. No acute intra thoracic process. Minor bibasilar atelectasis. Electronically Signed   By: MJerilynn Mages  Shick M.D.   On: 04/04/2015 20:20   I have personally reviewed and evaluated these images and lab results as part of my medical decision-making.   EKG Interpretation None      MDM   Final diagnoses:  Chest pressure    49year old Caucasian female with past medical history of hypertension, hyperlipidemia, diabetes presents in the setting of chest pain. Patient reports and she awoke today she noticed retrosternal to the left chest pressure which radiated to left arm and left neck. Patient seen at urgent care for this and setting of comorbidities and pain description was sent to the emergency department for further evaluation. Patient did receive GI cocktail with no significant improvement in symptoms at urgent care. She does have a history of GERD. On arrival patient was hemodynamically stable continues to complain of pain. Patient denied any diaphoresis, headaches, vision change, abdominal pain, nausea, vomiting, causes, diarrhea, rashes, recent sick contacts or recent travel, swelling in lower extremities. Patient does endorse some mild shortness of breath and reports she had a provoked DVT many years ago.  Setting of  patient's history cardiac workup initiated including EKG, chest x-ray, troponin. Chest x-ray which I personally reviewed reveals no acute cardiopulmonary abnormality. EKG reveals normal sinus rhythm without signs of acute ischemia. No significant changes in intervals or T wave amount is noted. Patient given 324 of aspirin as well as nitroglycerin without improvement symptoms. Patient additionally given morphine without improvement of symptoms. Initial troponin was not elevated and setting of previous PE, CT/PE scan was ordered.  CT scan did not reveal acute pulmonary embolus and no other obvious abnormalities that would account for patient's pain were noted. No significant abnormality in white blood cell count, anemia, electrolyte abnormality noted. Patient is high risk for heart score. And setting this findings patient will be admitted  to medicine for further management of chest pain and ACS rule out. Patient stable at time of admission to medicine.  Attending has seen and evaluated the patient and Dr. Audie Pinto is in agreement with plan.    Esaw Grandchild, MD 04/04/15 2130  Leonard Schwartz, MD 04/04/15 [phone]

## 2015-04-05 ENCOUNTER — Ambulatory Visit (HOSPITAL_BASED_OUTPATIENT_CLINIC_OR_DEPARTMENT_OTHER): Payer: BC Managed Care – PPO

## 2015-04-05 ENCOUNTER — Ambulatory Visit (HOSPITAL_COMMUNITY): Payer: BC Managed Care – PPO

## 2015-04-05 ENCOUNTER — Observation Stay (HOSPITAL_BASED_OUTPATIENT_CLINIC_OR_DEPARTMENT_OTHER): Payer: BC Managed Care – PPO

## 2015-04-05 ENCOUNTER — Encounter (HOSPITAL_COMMUNITY): Payer: BC Managed Care – PPO

## 2015-04-05 DIAGNOSIS — E7800 Pure hypercholesterolemia, unspecified: Secondary | ICD-10-CM | POA: Diagnosis not present

## 2015-04-05 DIAGNOSIS — Z86711 Personal history of pulmonary embolism: Secondary | ICD-10-CM | POA: Diagnosis not present

## 2015-04-05 DIAGNOSIS — R0789 Other chest pain: Secondary | ICD-10-CM | POA: Diagnosis not present

## 2015-04-05 DIAGNOSIS — R079 Chest pain, unspecified: Secondary | ICD-10-CM | POA: Diagnosis not present

## 2015-04-05 DIAGNOSIS — I10 Essential (primary) hypertension: Secondary | ICD-10-CM | POA: Diagnosis not present

## 2015-04-05 DIAGNOSIS — E109 Type 1 diabetes mellitus without complications: Secondary | ICD-10-CM | POA: Diagnosis not present

## 2015-04-05 DIAGNOSIS — Z794 Long term (current) use of insulin: Secondary | ICD-10-CM | POA: Diagnosis not present

## 2015-04-05 DIAGNOSIS — Z9641 Presence of insulin pump (external) (internal): Secondary | ICD-10-CM

## 2015-04-05 LAB — LIPID PANEL
CHOL/HDL RATIO: 2 ratio
CHOLESTEROL: 107 mg/dL (ref 0–200)
HDL: 54 mg/dL (ref >40)
LDL Cholesterol: 33 mg/dL (ref 0–99)
TRIGLYCERIDES: 101 mg/dL (ref <150)
VLDL: 20 mg/dL (ref 0–40)

## 2015-04-05 LAB — NM MYOCAR MULTI W/SPECT W/WALL MOTION / EF
CHL CUP MPHR: 170 {beats}/min
CHL CUP NUCLEAR SDS: 6
CHL CUP NUCLEAR SRS: 9
CHL CUP NUCLEAR SSS: 15
CSEPHR: 65 %
Estimated workload: 1 METS
Exercise duration (min): 0 min
Exercise duration (sec): 0 s
LHR: 0
LV dias vol: 59 mL
LV sys vol: 16 mL
Peak HR: 112 {beats}/min
Rest HR: 69 {beats}/min
TID: 1.31

## 2015-04-05 LAB — GLUCOSE, CAPILLARY
GLUCOSE-CAPILLARY: 97 mg/dL (ref 65–99)
GLUCOSE-CAPILLARY: 157 mg/dL — AB (ref 65–99)
Glucose-Capillary: 156 mg/dL — ABNORMAL HIGH (ref 65–99)

## 2015-04-05 LAB — TROPONIN I: Troponin I: <0.03 ng/mL (ref <0.031)

## 2015-04-05 LAB — TSH: TSH: 7.07 u[IU]/mL — ABNORMAL HIGH (ref 0.350–4.500)

## 2015-04-05 LAB — MRSA PCR SCREENING: MRSA by PCR: NEGATIVE

## 2015-04-05 LAB — D-DIMER, QUANTITATIVE: D-Dimer, Quant: 0.4 ug/mL-FEU (ref 0.00–0.50)

## 2015-04-05 LAB — T4, FREE: FREE T4: 0.91 ng/dL (ref 0.61–1.12)

## 2015-04-05 MED ORDER — TECHNETIUM TC 99M SESTAMIBI GENERIC - CARDIOLITE
10.0000 | Freq: Once | INTRAVENOUS | Status: AC | PRN
  Administered 2015-04-05: 10 via INTRAVENOUS

## 2015-04-05 MED ORDER — TECHNETIUM TC 99M SESTAMIBI GENERIC - CARDIOLITE
30.0000 | Freq: Once | INTRAVENOUS | Status: AC | PRN
  Administered 2015-04-05: 30 via INTRAVENOUS

## 2015-04-05 MED ORDER — EZETIMIBE 10 MG PO TABS
10.0000 mg | ORAL_TABLET | Freq: Every day | ORAL | Status: DC
  Administered 2015-04-05: 10 mg via ORAL

## 2015-04-05 MED ORDER — REGADENOSON 0.4 MG/5ML IV SOLN
0.4000 mg | Freq: Once | INTRAVENOUS | Status: AC
  Administered 2015-04-05: 0.4 mg via INTRAVENOUS
  Filled 2015-04-05: qty 5

## 2015-04-05 MED ORDER — DEXTROSE-NACL 5-0.45 % IV SOLN
INTRAVENOUS | Status: DC
  Administered 2015-04-05: 08:00:00 via INTRAVENOUS

## 2015-04-05 MED ORDER — REGADENOSON 0.4 MG/5ML IV SOLN
INTRAVENOUS | Status: AC
  Administered 2015-04-05: 0.4 mg via INTRAVENOUS
  Filled 2015-04-05: qty 5

## 2015-04-05 MED ORDER — IBUPROFEN 200 MG PO TABS: 400.0000 mg | ORAL_TABLET | Freq: Four times a day (QID) | ORAL | Status: DC | PRN

## 2015-04-05 MED ORDER — NITROGLYCERIN 0.4 MG SL SUBL: 0.4000 mg | SUBLINGUAL_TABLET | SUBLINGUAL | Status: DC | PRN

## 2015-04-05 NOTE — Progress Notes (Signed)
[facility]                                                                              Patient Demographics  Jocelyn [name], is a 49 y.o. female, DOB - March 15, 1966, [id number]  Admit date - 04/04/2015   Admitting Physician Toy Baker, MD  Outpatient Primary MD for the patient is Haywood Pao, MD  LOS - 1   Chief Complaint  Patient presents with  . Chest Pain       Brief HPI   Patient is a 49 year old female with diabetes, hyperlipidemia, prior pulmonary embolism in 2002 presented with chest pain substernal, radiating to jaw and left arm. Denied any nausea, vomiting, diaphoresis or shortness of breath. No EKG changes, patient admitted for chest pain rule out   Assessment & Plan    Chest pain: High risk factors including hyperlipidemia, hypertension, diabetes mellitus - Cardiac enzymes negative so far, EKG with no ST-T wave changes suggestive of ischemia - Patient was placed on NPO status and cardiology was consulted. - D-dimer negative - Cardiology recommended nuclear medicine stress test  Active Problems:   Hypercholesteremia - Currently stable, continue statin, Zetia    Essential hypertension, benign -Currently stable, continue metoprolol, ramipril     Diabetes mellitus type 1 ([place]),  Insulin pump in place  Elevated TSH - Follow T3, fT4  GERD - Continue PPI   Code Status: Full code  Family Communication: Discussed in detail with the patient, all imaging results, lab results explained to the patient   Disposition Plan: Awaiting stress test results  Time Spent in minutes  25 minutes  Procedures  Nuc med stress test   Consults   cardiology  DVT Prophylaxis  Lovenox   Medications  Scheduled Meds: . aspirin EC  81 mg Oral QHS  . atorvastatin  40 mg Oral q1800  . enoxaparin (LOVENOX) injection  40 mg Subcutaneous Q24H  . ezetimibe  10 mg Oral QHS  . insulin pump   Subcutaneous 6 times per day  . loratadine  10 mg  Oral Daily  . metoprolol succinate  25 mg Oral Daily  . pantoprazole  40 mg Oral Daily  . ramipril  2.5 mg Oral QHS  . regadenoson  0.4 mg Intravenous Once  . sertraline  100 mg Oral Daily   Continuous Infusions: . dextrose 5 % and 0.45% NaCl 50 mL/hr at 04/05/15 0730   PRN Meds:.acetaminophen, fentaNYL (SUBLIMAZE) injection, nitroGLYCERIN, ondansetron (ZOFRAN) IV, sucralfate   Antibiotics   Anti-infectives    None        Subjective:   Jocelyn [name] was seen and examined today. Still c/o CP  4/10, denies dizziness, shortness of breath, abdominal pain, N/V/D/C, new weakness, numbess, tingling. No acute events overnight.    Objective:   Blood pressure 104/59, pulse 71, temperature 97.9 F (36.6 C), temperature source Oral, resp. rate 18, height 5\' 1"  (1.549 m), weight 73.074 kg (161 lb 1.6 oz), SpO2 100 %.  Wt Readings from Last 3 Encounters:  04/05/15 73.074 kg (161 lb 1.6 oz)  11/06/14 74.844 kg (165 lb)  07/19/14 74.844 kg (165 lb)  Intake/Output Summary (Last 24 hours) at 04/05/15 1220 Last data filed at 04/05/15 0830  Gross per 24 hour  Intake    240 ml  Output    600 ml  Net   -360 ml    Exam  General: Alert and oriented x 3, NAD  HEENT:  PERRLA, EOMI, Anicteric Sclera, mucous membranes moist.   Neck: Supple, no JVD, no masses  CVS: S1 S2 auscultated, no rubs, murmurs or gallops. Regular rate and rhythm.  Respiratory: Clear to auscultation bilaterally, no wheezing, rales or rhonchi, no chest wall tenderness   Abdomen: Soft, nontender, nondistended, + bowel sounds  Ext: no cyanosis clubbing or edema  Neuro: AAOx3, Cr N's II- XII. Strength 5/5 upper and lower extremities bilaterally  Skin: No rashes  Psych: Normal affect and demeanor, alert and oriented x3    Data Review   Micro Results Recent Results (from the past 240 hour(s))  MRSA PCR Screening     Status: None   Collection Time: 04/04/15 11:19 PM  Result Value Ref Range Status    MRSA by PCR NEGATIVE NEGATIVE Final    Comment:        The GeneXpert MRSA Assay (FDA approved for NASAL specimens only), is one component of a comprehensive MRSA colonization surveillance program. It is not intended to diagnose MRSA infection nor to guide or monitor treatment for MRSA infections.     Radiology Reports Dg Chest 2 View  04/04/2015  CLINICAL DATA:  Central chest pain starting this morning EXAM: CHEST  2 VIEW COMPARISON:  06/14/2014 FINDINGS: Cardiomediastinal silhouette is stable. No acute infiltrate or pleural effusion. No pulmonary edema. Stable degenerative changes thoracic spine. IMPRESSION: No active cardiopulmonary disease. Again noted degenerative changes thoracic spine. Electronically Signed   By: Lahoma Crocker M.D.   On: 04/04/2015 18:23   Ct Angio Chest Pe W/cm &/or Wo Cm  04/04/2015  CLINICAL DATA:  Acute mid sternal chest pain radiating to the back and shoulders. EXAM: CT ANGIOGRAPHY CHEST WITH CONTRAST TECHNIQUE: Multidetector CT imaging of the chest was performed using the standard protocol during bolus administration of intravenous contrast. Multiplanar CT image reconstructions and MIPs were obtained to evaluate the vascular anatomy. CONTRAST:  146mL OMNIPAQUE IOHEXOL 350 MG/ML SOLN COMPARISON:  05/21/2012, 04/04/2015 FINDINGS: Mediastinum/Lymph Nodes: No pulmonary emboli or thoracic aortic dissection identified. No masses or pathologically enlarged lymph nodes identified. Lungs/Pleura: Minor dependent basilar atelectasis. No focal pneumonia, collapse or consolidation. No interstitial process or edema. No pneumothorax. Trachea and central airways remain patent. Upper abdomen: No acute findings. Musculoskeletal: No chest wall abnormality or soft tissue asymmetry. Minor thoracic endplate degenerative changes. No acute osseous finding. Intact sternum. Review of the MIP images confirms the above findings. IMPRESSION: No significant acute pulmonary embolus by CTA. No acute  intra thoracic process. Minor bibasilar atelectasis. Electronically Signed   By: Jerilynn Mages.  Shick M.D.   On: 04/04/2015 20:20    CBC  Recent Labs Lab 04/04/15 1750  WBC 8.4  HGB 14.2  HCT 42.5  PLT 262  MCV 89.7  MCH 30.0  MCHC 33.4  RDW 12.6    Chemistries   Recent Labs Lab 04/04/15 1750  NA 140  K 3.5  CL 103  CO2 26  GLUCOSE 101*  BUN 17  CREATININE 0.86  CALCIUM 9.4   ------------------------------------------------------------------------------------------------------------------ estimated creatinine clearance is 71.5 mL/min (by C-G formula based on Cr of 0.86). ------------------------------------------------------------------------------------------------------------------ No results for input(s): HGBA1C in the last 72 hours. ------------------------------------------------------------------------------------------------------------------  Recent Labs  04/05/15  0850  CHOL 107  HDL 54  LDLCALC 33  TRIG 101  CHOLHDL 2.0   ------------------------------------------------------------------------------------------------------------------  Recent Labs  04/05/15 0850  TSH 7.070*   ------------------------------------------------------------------------------------------------------------------ No results for input(s): VITAMINB12, FOLATE, FERRITIN, TIBC, IRON, RETICCTPCT in the last 72 hours.  Coagulation profile No results for input(s): INR, PROTIME in the last 168 hours.   Recent Labs  04/05/15 0815  DDIMER 0.40    Cardiac Enzymes  Recent Labs Lab 04/04/15 2348 04/05/15 0128 04/05/15 0430  TROPONINI <0.03 <0.03 <0.03   ------------------------------------------------------------------------------------------------------------------ Invalid input(s): POCBNP   Recent Labs  04/04/15 2301 04/05/15 0446 04/05/15 0737  GLUCAP 86 97 156*     Devin Ganaway M.D. [facility] 04/05/2015, 12:20 PM  Pager: [phone] Between 7am to 7pm -  call Pager - [phone]  After 7pm go to [email or web address] - password [email or web address]  Call night coverage person covering after 7pm

## 2015-04-05 NOTE — Consult Note (Signed)
CARDIOLOGY CONSULT NOTE   Patient ID: Jocelyn [name] MRN: [id number] DOB/AGE: 1966/04/04 49 y.o.  Admit date: 04/04/2015  Primary Physician   Haywood Pao, MD Primary Cardiologist   Dr. Lovena Le Reason for Consultation   Chest pain  Referring physician  Toy Baker, MD   HPI: Jocelyn [name] is a 49 y.o. female with a history of diabetes, dyslipidemia, pulmonary embolism (8099), complication of anesthesia, GERD, anxiety, chronic chest pain who came to [facility] ED 04/04/15 for evaluation of chest pain.   History of infrequent palpitation and catheterization on 2 different occasions demonstrating no obstructive coronary disease. Last cath 2005. Last Myoview 08/2010 was normal. Last stress echo to/2014 was normal. She is scheduled to have a Myoview in spring 2017.  Yesterday morning 12/21 patient woke up with the upper mid chest pain. She described the pain as a sharp that radiates to her right side underneath shoulder blade. Usually her pain resolved with taking Protonix every morning, however yesterday her pain persisted. Later in the day she had a left arm numbness along with some jaw pain. She states that her pain is constant since then. Currently rates 5 out of 10, previously was 8 out of 10. The patient denies any exertional chest pain or shortness of breath.  The patient denies nausea, vomiting, fever, palpitations, shortness of breath, orthopnea, PND, dizziness, syncope, cough, congestion, abdominal pain, hematochezia, melena, lower extremity edema. The patient has a history of GERD for which she takes Protonix every morning.  Troponin was 3 negative. EKG nonischemic. CTA without acute PE. Chest x-ray without acute cardiopulmonary disease.    Past Medical History  Diagnosis Date  . DM (diabetes mellitus) ([place])   . Dyslipidemia   . Chest pain     RECURRENT WITH NEGITIVE CATHERIZATIONS  . Anxiety   . Fatty liver   . GERD (gastroesophageal reflux disease)   .  Complication of anesthesia     aspirated and "gained 15pounds, ended up in ICU"  . Pulmonary embolus ([place]) 2002  . Tachycardia      Past Surgical History  Procedure Laterality Date  . Carpal tunnel release      right  . Fibroid tumor      UTERUS SURGERY  . Laparoscopic hysterectomy    . Cholecystectomy  03/24/2012    Procedure: LAPAROSCOPIC CHOLECYSTECTOMY WITH INTRAOPERATIVE CHOLANGIOGRAM;  Surgeon: Edward Jolly, MD;  Location: [facility] ORS;  Service: General;  Laterality: N/A;  . Cardiac catheterization      x2    Allergies  Allergen Reactions  . Pumpkin Flavor Anaphylaxis    Squash also  . Salmon [Fish Allergy] Anaphylaxis  . Sulfonamide Derivatives     REACTION: GI distress  . Morphine And Related Rash    I have reviewed the patient's current medications . aspirin EC  81 mg Oral QHS  . atorvastatin  40 mg Oral q1800  . enoxaparin (LOVENOX) injection  40 mg Subcutaneous Q24H  . ezetimibe  10 mg Oral QHS  . insulin pump   Subcutaneous 6 times per day  . loratadine  10 mg Oral Daily  . metoprolol succinate  25 mg Oral Daily  . pantoprazole  40 mg Oral Daily  . ramipril  2.5 mg Oral QHS  . sertraline  100 mg Oral Daily   . dextrose 5 % and 0.45% NaCl 50 mL/hr at 04/05/15 0730   acetaminophen, fentaNYL (SUBLIMAZE) injection, nitroGLYCERIN, ondansetron (ZOFRAN) IV, sucralfate  Prior to Admission medications  Medication Sig Start Date End Date Taking? Authorizing Provider  Ascorbic Acid (VITAMIN C) 500 MG tablet Take 500 mg by mouth daily.     Yes Historical Provider, MD  aspirin 81 MG tablet Take 81 mg by mouth at bedtime.    Yes Historical Provider, MD  Calcium Carbonate (CALCIUM 500 PO) Take 2 tablets by mouth daily.     Yes Historical Provider, MD  canagliflozin (INVOKANA) 100 MG TABS tablet Take 100 mg by mouth daily.   Yes Historical Provider, MD  cetirizine (ZYRTEC) 10 MG tablet Take 10 mg by mouth at bedtime.    Yes Historical Provider, MD  Cyanocobalamin  (VITAMIN B 12) 250 MCG LOZG Take 2 tablets by mouth daily.    Yes Historical Provider, MD  Insulin Infusion Pump KIT as directed.    Yes Historical Provider, MD  insulin lispro (HUMALOG) 100 UNIT/ML injection Inject 46.85 Units into the skin as directed. 1.6 units at 12 am and 6am 1.725 units ,12pm 2.3 units  3pm 2.0  Units  and 8 pm 2.5 units  total of 46.85 units daily-BASIL RATE IN INSULIN PUMP   Yes Historical Provider, MD  metoprolol succinate (TOPROL-XL) 25 MG 24 hr tablet Take 25 mg by mouth daily before breakfast.    Yes Historical Provider, MD  Multiple Vitamin (MULTIVITAMIN) tablet Take 1 tablet by mouth daily.     Yes Historical Provider, MD  pantoprazole (PROTONIX) 40 MG tablet Take 40 mg by mouth daily. Takes every morning, can take a second time if needed for acid   Yes Historical Provider, MD  ramipril (ALTACE) 2.5 MG capsule Take 2.5 mg by mouth at bedtime.   Yes Historical Provider, MD  sertraline (ZOLOFT) 100 MG tablet Take 100 mg by mouth daily before breakfast.   Yes Historical Provider, MD  simvastatin (ZOCOR) 80 MG tablet Take 80 mg by mouth daily at 6 PM.  11/04/14  Yes Historical Provider, MD  sucralfate (CARAFATE) 1 G tablet Take 1 g by mouth daily as needed (stomach upset). GAS   Yes Historical Provider, MD  ZETIA 10 MG tablet TAKE 1 TAB BY MOUTH ONCE DAILY AT BEDTIME 10/12/14  Yes Historical Provider, MD     Social History   Social History  . Marital Status: Single    Spouse Name: N/A  . Number of Children: 0  . Years of Education: N/A   Occupational History  . registered dietician    Social History Main Topics  . Smoking status: Never Smoker   . Smokeless tobacco: Never Used  . Alcohol Use: Yes     Comment: rarely  . Drug Use: No  . Sexual Activity: Not on file   Other Topics Concern  . Not on file   Social History Narrative    Family Status  Relation Status Death Age  . Father Alive   . Mother Alive    Family History  Problem Relation Age of  Onset  . Colon cancer Neg Hx   . Irritable bowel syndrome Father   . Gallbladder disease Mother   . Gallbladder disease      3 mat. uncles  . Cancer Maternal Grandmother     ovarian     ROS:  Full 14 point review of systems complete and found to be negative unless listed above.  Physical Exam: Blood pressure 119/62, pulse 80, temperature 97.8 F (36.6 C), temperature source Oral, resp. rate 18, height 5' 1" (1.549 m), weight 161 lb 1.6 oz (73.074 kg),  SpO2 100 %.  General: Well developed, well nourished, female in no acute distress Head: Eyes PERRLA, No xanthomas. Normocephalic and atraumatic, oropharynx without edema or exudate.  Lungs: Resp regular and unlabored, CTA. Heart: RRR no s3, s4, or murmurs..   Neck: No carotid bruits. No lymphadenopathy.  JVD. Abdomen: Bowel sounds present, abdomen soft and non-tender without masses or hernias noted. Msk:  No spine or cva tenderness. No weakness, no joint deformities or effusions. Extremities: No clubbing, cyanosis or edema. DP/PT/Radials 2+ and equal bilaterally. Neuro: Alert and oriented X 3. No focal deficits noted. Psych:  Good affect, responds appropriately Skin: No rashes or lesions noted.  Labs:   Lab Results  Component Value Date   WBC 8.4 04/04/2015   HGB 14.2 04/04/2015   HCT 42.5 04/04/2015   MCV 89.7 04/04/2015   PLT 262 04/04/2015   No results for input(s): INR in the last 72 hours.  Recent Labs Lab 04/04/15 1750  NA 140  K 3.5  CL 103  CO2 26  BUN 17  CREATININE 0.86  CALCIUM 9.4  GLUCOSE 101*   No results found for: MG  Recent Labs  04/04/15 2348 04/05/15 0128 04/05/15 0430  TROPONINI <0.03 <0.03 <0.03    Recent Labs  04/04/15 1751  TROPIPOC 0.00   Cath 05/2003 ANGIOGRAPHIC DATA: 1. Ventriculography: Ventriculography was not performed per previous plan.  1. Left Main Coronary Artery: The left main coronary artery was free of  critical disease.  1. Left Anterior Descending  Artery: The left anterior descending artery  coursed to the apex. The LAD appeared to be smooth really throughout.  At the apical tip the vessel bifurcated. There were two major diagonal  branches. The second diagonal branch had what appeared to be about a 30%  area of focal narrowing near the origin. High-grade obstruction,  however, was not identified.  1. Circumflex: The circumflex provides two marginal branches. The second  marginal branch bifurcates distally. The AV circumflex was without  critical disease.  1. Right Coronary Artery: The right coronary artery was a dominant vessel.  It was smooth throughout its course and provided a bifurcating posterior  descending and a bifurcating posterolateral branch. There was a third  small posterolateral branch and an AV nodal artery. The right was large  in caliber and free of critical disease.  CONCLUSION: 1. Normal left ventricular function previously demonstrated by radionuclide  imaging. 2. No high-grade areas of obstruction with the only area of abnormality  being a small diagonal with probably 30% ostial narrowing.  DISPOSITION: The patient has had exercise-induced discomfort. After discussion with the family they feel that she would benefit from cardiac rehab. We will arrange follow up with Dr. Cristopher Peru.   ECG:   Vent. rate 63 BPM PR interval 158 ms QRS duration 90 ms QT/QTc 402/411 ms P-R-T axes 51 16 31  Radiology:  Dg Chest 2 View  04/04/2015  CLINICAL DATA:  Central chest pain starting this morning EXAM: CHEST  2 VIEW COMPARISON:  06/14/2014 FINDINGS: Cardiomediastinal silhouette is stable. No acute infiltrate or pleural effusion. No pulmonary edema. Stable degenerative changes thoracic spine. IMPRESSION: No active cardiopulmonary disease. Again noted degenerative changes thoracic spine. Electronically Signed   By: Lahoma Crocker M.D.   On: 04/04/2015 18:23     Ct Angio Chest Pe W/cm &/or Wo Cm  04/04/2015  CLINICAL DATA:  Acute mid sternal chest pain radiating to the back and shoulders. EXAM: CT ANGIOGRAPHY CHEST WITH CONTRAST TECHNIQUE: Multidetector  CT imaging of the chest was performed using the standard protocol during bolus administration of intravenous contrast. Multiplanar CT image reconstructions and MIPs were obtained to evaluate the vascular anatomy. CONTRAST:  134m OMNIPAQUE IOHEXOL 350 MG/ML SOLN COMPARISON:  05/21/2012, 04/04/2015 FINDINGS: Mediastinum/Lymph Nodes: No pulmonary emboli or thoracic aortic dissection identified. No masses or pathologically enlarged lymph nodes identified. Lungs/Pleura: Minor dependent basilar atelectasis. No focal pneumonia, collapse or consolidation. No interstitial process or edema. No pneumothorax. Trachea and central airways remain patent. Upper abdomen: No acute findings. Musculoskeletal: No chest wall abnormality or soft tissue asymmetry. Minor thoracic endplate degenerative changes. No acute osseous finding. Intact sternum. Review of the MIP images confirms the above findings. IMPRESSION: No significant acute pulmonary embolus by CTA. No acute intra thoracic process. Minor bibasilar atelectasis. Electronically Signed   By: MJerilynn Mages  Shick M.D.   On: 04/04/2015 20:20    ASSESSMENT AND PLAN:     1. Chest pain - Her chest pain has both typical and atypical features. Chronic ongoing. However did not improve with Protonix yesterday and persisted. - History of infrequent palpitation and catheterization on 2 different occasions demonstrating no obstructive coronary disease. Last cath 2005. Last Myoview 08/2010 was normal. Last stress echo to/2014 was normal. She is scheduled to have a Myoview in spring 2017 for followup. - Will schedule exercise Myoview today to rule out ischemia. D-Dimer negative - Troponin was 3 negative. EKG nonischemic. CTA without acute PE. Chest x-ray without acute cardiopulmonary disease.  -  Continue aspirin/statin/beta blocker and ACE inhibitor.   2. HL - LDL at goal at 33 - Continue lipitor and Zetia  3. Diabetes mellitus type 1 ([place] - Per primary  4. Hx of PE -  No acute PE on CTA   Signed: Bhagat,Bhavinkumar, PA 04/05/2015, 7:55 AM Pager [phone] Patient seen and examined with BLeanor Kail PA. We discussed all aspects of the encounter. I agree with the assessment and plan as stated above.  Patient presents with atypical sharp CP for 24 hours with normal troponin.  D-Dimer and chest CT neg for PE.  EKG nonischemic.  This has apparently been going on off and on for years with negative workup in the past.  She was scheduled for stress test in Spring.  Will go ahead and get stress myoview today since she is NPO to rule out ischemia.   Signed: TFransico Him MD [facility] 04/05/2015  Co-Sign MD

## 2015-04-05 NOTE — Progress Notes (Addendum)
Lexiscan stress test completed without significant complication. Pending result by [facility] reader. Does have severe headache, will order PRN ibuprofen as patient states she was told not to take Tylenol  Signed, Almyra Deforest PA Pager: [phone]

## 2015-04-05 NOTE — Progress Notes (Signed)
Having 5/10 chest pain that has been present since admission. The pain is sharp, non radiating and associated with a little SOB. The pain increase with a deep breath and with palpation. Not distressed. Color pink and skin warm and dry. Has slight nausea and 7/10 headache.

## 2015-04-05 NOTE — Progress Notes (Signed)
Echocardiogram 2D Echocardiogram has been performed.  Jocelyn [name] 04/05/2015, 3:37 PM

## 2015-04-05 NOTE — Discharge Summary (Signed)
Physician Discharge Summary   Patient ID: Jocelyn [name] MRN: [id number] DOB/AGE: 1966-01-02 49 y.o.  Admit date: 04/04/2015 Discharge date: 04/05/2015  Primary Care Physician:  Haywood Pao, MD  Discharge Diagnoses:     . Chest pain . Essential hypertension, benign . Hypercholesteremia . History of PE . Diabetes mellitus type 1 (HCC)  Consults:  cardiology, Dr. Golden Hurter  Recommendations for Outpatient Follow-up:  1. Please repeat CBC/BMET at next visit 2.  Patient was advised to take PPI twice a day if she continues to have any further episodes of chest pain and discuss with PCP regarding a GI referral    DIET: Carb modified diet    Allergies:   Allergies  Allergen Reactions  . Pumpkin Flavor Anaphylaxis    Squash also  . Salmon [Fish Allergy] Anaphylaxis  . Sulfonamide Derivatives     REACTION: GI distress  . Morphine And Related Rash     DISCHARGE MEDICATIONS: Current Discharge Medication List    START taking these medications   Details  nitroGLYCERIN (NITROSTAT) 0.4 MG SL tablet Place 1 tablet (0.4 mg total) under the tongue every 5 (five) minutes as needed for chest pain. Qty: 30 tablet, Refills: 12      CONTINUE these medications which have NOT CHANGED   Details  Ascorbic Acid (VITAMIN C) 500 MG tablet Take 500 mg by mouth daily.      aspirin 81 MG tablet Take 81 mg by mouth at bedtime.     Calcium Carbonate (CALCIUM 500 PO) Take 2 tablets by mouth daily.      canagliflozin (INVOKANA) 100 MG TABS tablet Take 100 mg by mouth daily.    cetirizine (ZYRTEC) 10 MG tablet Take 10 mg by mouth at bedtime.     Cyanocobalamin (VITAMIN B 12) 250 MCG LOZG Take 2 tablets by mouth daily.     Insulin Infusion Pump KIT as directed.     insulin lispro (HUMALOG) 100 UNIT/ML injection Inject 46.85 Units into the skin as directed. 1.6 units at 12 am and 6am 1.725 units ,12pm 2.3 units  3pm 2.0  Units  and 8 pm 2.5 units  total of 46.85 units daily-BASIL  RATE IN INSULIN PUMP    metoprolol succinate (TOPROL-XL) 25 MG 24 hr tablet Take 25 mg by mouth daily before breakfast.     Multiple Vitamin (MULTIVITAMIN) tablet Take 1 tablet by mouth daily.      pantoprazole (PROTONIX) 40 MG tablet Take 40 mg by mouth daily. Takes every morning, can take a second time if needed for acid    ramipril (ALTACE) 2.5 MG capsule Take 2.5 mg by mouth at bedtime.    sertraline (ZOLOFT) 100 MG tablet Take 100 mg by mouth daily before breakfast.    simvastatin (ZOCOR) 80 MG tablet Take 80 mg by mouth daily at 6 PM.     sucralfate (CARAFATE) 1 G tablet Take 1 g by mouth daily as needed (stomach upset). GAS    ZETIA 10 MG tablet TAKE 1 TAB BY MOUTH ONCE DAILY AT BEDTIME Refills: 3         Brief H and P: For complete details please refer to admission H and P, but in brief Patient is a 49 year old female with diabetes, hyperlipidemia, prior pulmonary embolism in 2002 presented with chest pain substernal, radiating to jaw and left arm. Denied any nausea, vomiting, diaphoresis or shortness of breath. No EKG changes, patient admitted for chest pain rule out  Hospital Course:  Chest pain,  atypical: High risk factors including hyperlipidemia, hypertension, diabetes mellitus - Cardiac enzymes negative so far, EKG with no ST-T wave changes suggestive of ischemia - Patient was placed on NPO status and cardiology was consulted. - D-dimer negative, CT angio chest negative for pulmonary embolism - Cardiology recommended nuclear medicine stress test. Stress test showed normal study, no ST segment deviation noted during the stress test, overall low risk stress nuclear study with normal perfusion and normal left ventricular regional and global systolic function. - 2-D echo showed EF of 55-60%, no regional wall motion abnormalities, grade 1 diastolic dysfunction. - Patient also has a history of GERD, taking PPI daily, recommended patient to try PPI bid x 14days to assess for  any improvement and discuss with PCP if she has any recurrent chest pain. - No further cardiac workup was recommended per Dr. Radford Pax.   Hypercholesteremia - Currently stable, continue statin, Zetia   Essential hypertension, benign -Currently stable, continue metoprolol, ramipril   Diabetes mellitus type 1 ([place]), Insulin pump in place  Elevated TSH - Follow T3, fT4  GERD - Continue PPI    Day of Discharge BP 117/61 mmHg  Pulse 81  Temp(Src) 98.4 F (36.9 C) (Oral)  Resp 20  Ht '5\' 1"'$  (1.549 m)  Wt 73.074 kg (161 lb 1.6 oz)  BMI 30.46 kg/m2  SpO2 98%  Physical Exam: General: Alert and awake oriented x3 not in any acute distress. HEENT: anicteric sclera, pupils reactive to light and accommodation CVS: S1-S2 clear no murmur rubs or gallops Chest: clear to auscultation bilaterally, no wheezing rales or rhonchi Abdomen: soft nontender, nondistended, normal bowel sounds Extremities: no cyanosis, clubbing or edema noted bilaterally Neuro: Cranial nerves II-XII intact, no focal neurological deficits   The results of significant diagnostics from this hospitalization (including imaging, microbiology, ancillary and laboratory) are listed below for reference.    LAB RESULTS: Basic Metabolic Panel:  Recent Labs Lab 04/04/15 1750  NA 140  K 3.5  CL 103  CO2 26  GLUCOSE 101*  BUN 17  CREATININE 0.86  CALCIUM 9.4   Liver Function Tests: No results for input(s): AST, ALT, ALKPHOS, BILITOT, PROT, ALBUMIN in the last 168 hours. No results for input(s): LIPASE, AMYLASE in the last 168 hours. No results for input(s): AMMONIA in the last 168 hours. CBC:  Recent Labs Lab 04/04/15 1750  WBC 8.4  HGB 14.2  HCT 42.5  MCV 89.7  PLT 262   Cardiac Enzymes:  Recent Labs Lab 04/05/15 0128 04/05/15 0430  TROPONINI <0.03 <0.03   BNP: Invalid input(s): POCBNP CBG:  Recent Labs Lab 04/05/15 0737 04/05/15 1341  GLUCAP 156* 157*    Significant Diagnostic  Studies:  Dg Chest 2 View  04/04/2015  CLINICAL DATA:  Central chest pain starting this morning EXAM: CHEST  2 VIEW COMPARISON:  06/14/2014 FINDINGS: Cardiomediastinal silhouette is stable. No acute infiltrate or pleural effusion. No pulmonary edema. Stable degenerative changes thoracic spine. IMPRESSION: No active cardiopulmonary disease. Again noted degenerative changes thoracic spine. Electronically Signed   By: Lahoma Crocker M.D.   On: 04/04/2015 18:23   Ct Angio Chest Pe W/cm &/or Wo Cm  04/04/2015  CLINICAL DATA:  Acute mid sternal chest pain radiating to the back and shoulders. EXAM: CT ANGIOGRAPHY CHEST WITH CONTRAST TECHNIQUE: Multidetector CT imaging of the chest was performed using the standard protocol during bolus administration of intravenous contrast. Multiplanar CT image reconstructions and MIPs were obtained to evaluate the vascular anatomy. CONTRAST:  140m OMNIPAQUE IOHEXOL 350  MG/ML SOLN COMPARISON:  05/21/2012, 04/04/2015 FINDINGS: Mediastinum/Lymph Nodes: No pulmonary emboli or thoracic aortic dissection identified. No masses or pathologically enlarged lymph nodes identified. Lungs/Pleura: Minor dependent basilar atelectasis. No focal pneumonia, collapse or consolidation. No interstitial process or edema. No pneumothorax. Trachea and central airways remain patent. Upper abdomen: No acute findings. Musculoskeletal: No chest wall abnormality or soft tissue asymmetry. Minor thoracic endplate degenerative changes. No acute osseous finding. Intact sternum. Review of the MIP images confirms the above findings. IMPRESSION: No significant acute pulmonary embolus by CTA. No acute intra thoracic process. Minor bibasilar atelectasis. Electronically Signed   By: Jerilynn Mages.  Shick M.D.   On: 04/04/2015 20:20    2D ECHO:  Study Conclusions  - Left ventricle: The cavity size was normal. Wall thickness was normal. Systolic function was normal. The estimated ejection fraction was in the range of 55% to  60%. Wall motion was normal; there were no regional wall motion abnormalities. Doppler parameters are consistent with abnormal left ventricular relaxation (grade 1 diastolic dysfunction).  Impressions:  - Normal LV function; grade 1 diastolic dysfunction; trace MR and TR.  Disposition and Follow-up:    DISPOSITION: home    DISCHARGE FOLLOW-UP Follow-up Information    Follow up with Haywood Pao, MD. Schedule an appointment as soon as possible for a visit in 2 weeks.   Specialty:  Internal Medicine   Why:  for hospital follow-up   Contact information:   [place] [id number] [phone]        Time spent on Discharge: 34mns   Signed:   Maryetta Shafer M.D. [facility] 04/05/2015, 5:24 PM Pager: [phone]

## 2015-04-06 LAB — T3: T3 TOTAL: 100 ng/dL (ref 71–180)

## 2015-05-07 ENCOUNTER — Encounter: Payer: Self-pay | Admitting: Internal Medicine

## 2015-05-07 ENCOUNTER — Ambulatory Visit (INDEPENDENT_AMBULATORY_CARE_PROVIDER_SITE_OTHER): Payer: BC Managed Care – PPO | Admitting: Internal Medicine

## 2015-05-07 VITALS — BP 110/70 | HR 64 | Ht 61.0 in | Wt 167.0 lb

## 2015-05-07 DIAGNOSIS — K219 Gastro-esophageal reflux disease without esophagitis: Secondary | ICD-10-CM | POA: Diagnosis not present

## 2015-05-07 DIAGNOSIS — R079 Chest pain, unspecified: Secondary | ICD-10-CM | POA: Diagnosis not present

## 2015-05-07 NOTE — Progress Notes (Signed)
HISTORY OF PRESENT ILLNESS:  Jocelyn [name] is a 50 y.o. female with hypertension, diabetes mellitus, obesity, and chronic atypical chest pain who presents today regarding chest pain. The patient has had chronic recurrent atypical chest pain for some time. Multiple negative cardiac workups. Most recent GI office evaluation for the same April 2016. She subsequently underwent colonoscopy (screening) and upper endoscopy (to evaluate chest pain and epigastric discomfort) 11/06/2014. Colonoscopy was normal. Upper endoscopy was normal. Her pain was not felt to be GI. She has continued intermittently with very degrees of chest discomfort. There are no inciting or relieving factors. The pain became severe December 21 and she was hospitalized overnight. Negative cardiac workup. She was told that she had "esophageal spasm". She continues to deny dysphagia. She occasionally has reflux symptoms which are controlled with regular PPI therapy. She states that the discomfort can be constant for several weeks. She notices some discomfort today at "level 2 out of 10". Her only other GI complaints are bloating and belching.  REVIEW OF SYSTEMS:  All non-GI ROS negative except for fatigue  Past Medical History  Diagnosis Date  . DM (diabetes mellitus) ([place])   . Dyslipidemia   . Chest pain     RECURRENT WITH NEGITIVE CATHERIZATIONS  . Anxiety   . Fatty liver   . GERD (gastroesophageal reflux disease)   . Complication of anesthesia     aspirated and "gained 15pounds, ended up in ICU"  . Pulmonary embolus ([place]) 2002  . Tachycardia   . Hypertension   . Hypercholesteremia   . Obesity     Past Surgical History  Procedure Laterality Date  . Carpal tunnel release      right  . Fibroid tumor      UTERUS SURGERY  . Laparoscopic hysterectomy    . Cholecystectomy  03/24/2012    Procedure: LAPAROSCOPIC CHOLECYSTECTOMY WITH INTRAOPERATIVE CHOLANGIOGRAM;  Surgeon: Edward Jolly, MD;  Location: [facility];  Service:  General;  Laterality: N/A;  . Cardiac catheterization      x2    Social History Al Decant  reports that she has never smoked. She has never used smokeless tobacco. She reports that she drinks alcohol. She reports that she does not use illicit drugs.  family history includes Cancer in her maternal grandmother; Gallbladder disease in her mother; Irritable bowel syndrome in her father. There is no history of Colon cancer.  Allergies  Allergen Reactions  . Pumpkin Flavor Anaphylaxis    Squash also  . Salmon [Fish Allergy] Anaphylaxis  . Sulfonamide Derivatives     REACTION: GI distress  . Morphine And Related Rash       PHYSICAL EXAMINATION: Vital signs: BP 110/70 mmHg  Pulse 64  Ht 5\' 1"  (1.549 m)  Wt 167 lb (75.751 kg)  BMI 31.57 kg/m2  Constitutional: Pleasant, without thyromegaly Lymph: generally well-appearing, no acute distress Psychiatric: alert and oriented x3, cooperative Eyes: extraocular movements intact, anicteric, conjunctiva pink Mouth: oral pharynx moist, no lesions Neck: supple no lymphadenopathy Cardiovascular: heart regular rate and rhythm, no murmur Lungs: clear to auscultation bilaterally Abdomen: soft, obese, nontender, nondistended, no obvious ascites, no peritoneal signs, normal bowel sounds, no organomegaly. Insulin pump and monitor on abdomen Rectal: Omitted. Normal July 2016 Extremities: no clubbing cyanosis or lower extremity edema bilaterally Skin: no lesions on visible extremities Neuro: No focal deficits. Cranial nerves intact  ASSESSMENT:  1. Atypical chest pain. Unlikely GI. Question of esophageal spasm raised. 2. GERD. Mild. Well controlled with PPI. Normal  EGD July 2016 3. Multiple medical problems   PLAN:  1. Reflux precautions with attention to weight loss 2. Continue PPI. Lowest dose to control classic symptoms of reflux 3. Schedule esophageal manometry to evaluate for significant abnormality to explain chest pain 4. Repeat  screening colonoscopy 10 years 5. Ongoing general medical care with Dr. Osborne Casco

## 2015-05-07 NOTE — Patient Instructions (Signed)
You have been scheduled for an esophageal manometry at [facility] on 05/23/2015 at 8:30am. Please arrive 30 minutes prior to your procedure for registration. You will need to go to outpatient registration (1st floor of the hospital) first. Make certain to bring your insurance cards as well as a complete list of medications.  Please remember the following:  1) Nothing to eat or drink after 12:00 midnight on the night before your test.  2) Hold all diabetic medications/insulin the morning of the test. You may eat and take your medications after the test.  3) For 3 days prior to your test do not take: Dexilant, Prevacid, Nexium, Protonix,         Aciphex, Zegerid, Pantoprazole, Prilosec or omeprazole.  4) For 2 days prior to your test, do not take: Reglan, Tagamet, Zantac, Axid or Pepcid.  5) You MAY use an antacid such as Rolaids or Tums up to 12 hours prior to your test.  It will take at least 2 weeks to receive the results of this test from your physician. ------------------------------------------ ABOUT ESOPHAGEAL MANOMETRY Esophageal manometry (muh-NOM-uh-tree) is a test that gauges how well your esophagus works. Your esophagus is the long, muscular tube that connects your throat to your stomach. Esophageal manometry measures the rhythmic muscle contractions (peristalsis) that occur in your esophagus when you swallow. Esophageal manometry also measures the coordination and force exerted by the muscles of your esophagus.  During esophageal manometry, a thin, flexible tube (catheter) that contains sensors is passed through your nose, down your esophagus and into your stomach. Esophageal manometry can be helpful in diagnosing some mostly uncommon disorders that affect your esophagus.  Why it's done Esophageal manometry is used to evaluate the movement (motility) of food through the esophagus and into the stomach. The test measures how well the circular bands of muscle (sphincters) at the  top and bottom of your esophagus open and close, as well as the pressure, strength and pattern of the wave of esophageal muscle contractions that moves food along.  What you can expect Esophageal manometry is an outpatient procedure done without sedation. Most people tolerate it well. You may be asked to change into a hospital gown before the test starts.  During esophageal manometry  While you are sitting up, a member of your health care team sprays your throat with a numbing medication or puts numbing gel in your nose or both.  A catheter is guided through your nose into your esophagus. The catheter may be sheathed in a water-filled sleeve. It doesn't interfere with your breathing. However, your eyes may water, and you may gag. You may have a slight nosebleed from irritation.  After the catheter is in place, you may be asked to lie on your back on an exam table, or you may be asked to remain seated.  You then swallow small sips of water. As you do, a computer connected to the catheter records the pressure, strength and pattern of your esophageal muscle contractions.  During the test, you'll be asked to breathe slowly and smoothly, remain as still as possible, and swallow only when you're asked to do so.  A member of your health care team may move the catheter down into your stomach while the catheter continues its measurements.  The catheter then is slowly withdrawn. The test usually lasts 20 to 30 minutes.  After esophageal manometry  When your esophageal manometry is complete, you may return to your normal activities  This test typically takes 30-45  minutes to complete. ________________________________________________________________________________

## 2015-05-28 ENCOUNTER — Encounter (HOSPITAL_COMMUNITY)
Admission: RE | Disposition: A | Discharge status: Home / Self Care | Payer: Self-pay | Source: Ambulatory Visit | Attending: Internal Medicine

## 2015-05-28 ENCOUNTER — Ambulatory Visit (HOSPITAL_COMMUNITY)
Admission: RE | Admit: 2015-05-28 | Discharge: 2015-05-28 | Disposition: A | Discharge status: Home / Self Care | Payer: BC Managed Care – PPO | Source: Ambulatory Visit | Attending: Internal Medicine | Admitting: Internal Medicine

## 2015-05-28 DIAGNOSIS — K219 Gastro-esophageal reflux disease without esophagitis: Secondary | ICD-10-CM | POA: Insufficient documentation

## 2015-05-28 DIAGNOSIS — R0789 Other chest pain: Secondary | ICD-10-CM | POA: Diagnosis not present

## 2015-05-28 HISTORY — PX: ESOPHAGEAL MANOMETRY: SHX5429

## 2015-05-28 SURGERY — MANOMETRY, ESOPHAGUS

## 2015-05-28 MED ORDER — LIDOCAINE VISCOUS 2 % MT SOLN
OROMUCOSAL | Status: AC
  Filled 2015-05-28: qty 15

## 2015-05-28 SURGICAL SUPPLY — 2 items
FACESHIELD LNG OPTICON STERILE (SAFETY) IMPLANT
GLOVE BIO SURGEON STRL SZ8 (GLOVE) ×4 IMPLANT

## 2015-05-29 ENCOUNTER — Encounter (HOSPITAL_COMMUNITY): Payer: Self-pay | Admitting: Internal Medicine

## 2015-05-29 DIAGNOSIS — R0789 Other chest pain: Secondary | ICD-10-CM | POA: Insufficient documentation

## 2015-05-30 ENCOUNTER — Telehealth: Payer: Self-pay

## 2015-05-30 NOTE — Telephone Encounter (Signed)
-----   Message from Irene Shipper, MD sent at 05/30/2015 10:49 AM EST ----- Regarding: Manometry results Vaughan Basta, Please let the patient know that her esophageal manometry was entirely normal. She does NOT have esophageal spasm as a cause for her recurrent chest pain. Let her know that we find no evidence of any gastrointestinal cause for her chest pain. Should she have recurrent problems with her chest pain, she should return to her primary care provider for further assessment and treatment. Please convert this to a phone note for the record. Thank you Dr. Henrene Pastor

## 2015-05-30 NOTE — Telephone Encounter (Signed)
Pt aware.

## 2015-05-30 NOTE — Telephone Encounter (Signed)
Left message for pt to call back  °

## 2015-06-17 NOTE — Progress Notes (Signed)
Cardiology Office Note:    Date:  06/18/2015   ID:  Jocelyn [name], DOB [birth year], MRN [id number]  PCP:  Jocelyn Garbe, MD  Cardiologist:  Dr. Lewayne Bunting   Electrophysiologist:  Dr. Lewayne Bunting   Chief Complaint  Patient presents with  . Chest Pain    History of Present Illness:     Jocelyn [name] is a 50 y.o. female with a hx of DM2, HL, HTN, prior pulmonary embolism in 2002, chronic chest pain.  LHC in 2005 was normal.  Last seen by Dr. Lewayne Bunting in 4/16.  Admitted in 12/16 with chest pain.  CEs remained neg.  DDimer was neg.  CTA was neg for PE.  Inpt Nuc stress test was low risk and neg for ischemia.  No further cardiac workup recommended.  She has also seen GI.  EGD in 7/16 was normal. Recently she underwent esophageal manometry which was normal, ruling out the possibility of esophageal spasm.    Patient presents for further evaluation of chest pain. She is here alone. She had chest discomfort for about 2 weeks in December. This resolved. Her symptoms returned about a week or so ago. As noted above, she was undergone extensive evaluation with gastroenterology without clear findings to explain her chest discomfort. Her symptoms are substernal and pressure. She has some radiation to her right scapula. She denies dyspnea. She does have occasional nausea and diaphoresis. She denies pleuritic symptoms. She denies symptoms with lying supine. She has occasional dysphagia. She's not really had any significant improvement on twice-daily Protonix.     Past Medical History  Diagnosis Date  . DM (diabetes mellitus) (HCC)   . Dyslipidemia   . Chest pain     RECURRENT WITH NEGITIVE CATHERIZATIONS  . Anxiety   . Fatty liver   . GERD (gastroesophageal reflux disease)   . Complication of anesthesia     aspirated and "gained 15pounds, ended up in ICU"  . Pulmonary embolus (HCC) 2002  . Tachycardia   . Hypertension   . Hypercholesteremia   . Obesity     Past Surgical History    Procedure Laterality Date  . Carpal tunnel release      right  . Fibroid tumor      UTERUS SURGERY  . Laparoscopic hysterectomy    . Cholecystectomy  03/24/2012    Procedure: LAPAROSCOPIC CHOLECYSTECTOMY WITH INTRAOPERATIVE CHOLANGIOGRAM;  Surgeon: Mariella Saa, MD;  Location: [facility];  Service: General;  Laterality: N/A;  . Cardiac catheterization      x2  . Esophageal manometry N/A 05/28/2015    Procedure: ESOPHAGEAL MANOMETRY (EM);  Surgeon: Hilarie Fredrickson, MD;  Location: [facility];  Service: Gastroenterology;  Laterality: N/A;    Current Medications: Outpatient Prescriptions Prior to Visit  Medication Sig Dispense Refill  . Ascorbic Acid (VITAMIN C) 500 MG tablet Take 500 mg by mouth daily.      Marland Kitchen aspirin 81 MG tablet Take 81 mg by mouth at bedtime.     . Calcium Carbonate (CALCIUM 500 PO) Take 2 tablets by mouth daily.      . canagliflozin (INVOKANA) 100 MG TABS tablet Take 100 mg by mouth daily.    . cetirizine (ZYRTEC) 10 MG tablet Take 10 mg by mouth at bedtime.     . Cyanocobalamin (VITAMIN B 12) 250 MCG LOZG Take 2 tablets by mouth daily.     . Insulin Infusion Pump KIT as directed.     . insulin lispro (HUMALOG)  100 UNIT/ML injection Inject 46.85 Units into the skin as directed. 1.6 units at 12 am and 6am 1.725 units ,12pm 2.3 units  3pm 2.0  Units  and 8 pm 2.5 units  total of 46.85 units daily-BASIL RATE IN INSULIN PUMP    . Multiple Vitamin (MULTIVITAMIN) tablet Take 1 tablet by mouth daily.      . nitroGLYCERIN (NITROSTAT) 0.4 MG SL tablet Place 1 tablet (0.4 mg total) under the tongue every 5 (five) minutes as needed for chest pain. 30 tablet 12  . pantoprazole (PROTONIX) 40 MG tablet Take 40 mg by mouth 2 (two) times daily. Takes every morning, can take a second time if needed for acid    . ramipril (ALTACE) 2.5 MG capsule Take 2.5 mg by mouth at bedtime.    . sertraline (ZOLOFT) 100 MG tablet Take 100 mg by mouth daily before breakfast.    . simvastatin  (ZOCOR) 80 MG tablet Take 80 mg by mouth daily at 6 PM.     . sucralfate (CARAFATE) 1 G tablet Take 1 g by mouth daily as needed (stomach upset). GAS    . ZETIA 10 MG tablet TAKE 1 TAB BY MOUTH ONCE DAILY AT BEDTIME  3  . metoprolol succinate (TOPROL-XL) 25 MG 24 hr tablet Take 25 mg by mouth as directed. 25 mg AM and 12.5 mg PM     No facility-administered medications prior to visit.     Allergies:   Pumpkin flavor; Salmon; Sulfonamide derivatives; and Morphine and related   Social History   Social History  . Marital Status: Single    Spouse Name: N/A  . Number of Children: 0  . Years of Education: N/A   Occupational History  . registered dietician    Social History Main Topics  . Smoking status: Never Smoker   . Smokeless tobacco: Never Used  . Alcohol Use: Yes     Comment: rarely  . Drug Use: No  . Sexual Activity: Not Asked   Other Topics Concern  . None   Social History Narrative     Family History:  The patient's family history includes Cancer in her maternal grandmother; Gallbladder disease in her mother; Irritable bowel syndrome in her father. There is no history of Colon cancer.   ROS:   Please see the history of present illness.    Review of Systems  Constitution: Positive for diaphoresis and malaise/fatigue.  Cardiovascular: Positive for chest pain.  All other systems reviewed and are negative.   Physical Exam:    VS:  BP 120/60 mmHg  Pulse 80  Ht '5\' 1"'$  (1.549 m)  Wt 165 lb 6.4 oz (75.025 kg)  BMI 31.27 kg/m2   GEN: Well nourished, well developed, in no acute distress HEENT: normal Neck: no JVD, no masses Cardiac: Normal S1/S2, RRR; no murmurs,   no edema;    Respiratory:  clear to auscultation bilaterally; no wheezing, rhonchi or rales GI: soft, nontender  MS: no deformity or atrophy Skin: warm and dry  Neuro:   no focal deficits  Psych: Alert and oriented x 3, normal affect  Wt Readings from Last 3 Encounters:  06/18/15 165 lb 6.4 oz  (75.025 kg)  05/07/15 167 lb (75.751 kg)  04/05/15 161 lb 1.6 oz (73.074 kg)      Studies/Labs Reviewed:     EKG:  EKG is  ordered today.  The ekg ordered today demonstrates NSR, HR 80, normal axis, low voltage, QTc 422 ms  Recent Labs:  04/04/2015: BUN 17; Creatinine, Ser 0.86; Hemoglobin 14.2; Platelets 262; Potassium 3.5; Sodium 140 04/05/2015: TSH 7.070*   Recent Lipid Panel    Component Value Date/Time   CHOL 107 04/05/2015 0850   TRIG 101 04/05/2015 0850   HDL 54 04/05/2015 0850   CHOLHDL 2.0 04/05/2015 0850   VLDL 20 04/05/2015 0850   LDLCALC 33 04/05/2015 0850    Additional studies/ records that were reviewed today include:   Echo 12/16 EF 55-60%, no RWMA, Gr 1 DD  Myoview 12/16 Low risk stress nuclear study with normal perfusion and normal left ventricular regional and global systolic function.  ETT-Echo 2/14 There was chest pain with exertion but no evidence forischemia by echo imaging or ECG.  Echo 9/11 EF 55-60%, normal wall motion, grade 2 diastolic dysfunction  LHC 2/05 LM free of disease LAD - D2 30% LCx no disease RCA no disease Normal LV function    ASSESSMENT:     1. Chest pain, unspecified chest pain type   2. Essential hypertension, benign   3. Hypercholesteremia   4. Gastroesophageal reflux disease without esophagitis     PLAN:     In order of problems listed above:  1. Chest pain - Typical and atypical features. She has also had some tachycardia with her chest pain.  She previously took a higher dose of beta-blocker. She has significant risk factors including diabetes, hypertension, hyperlipidemia. Recent admission to the hospital with normal echo and low risk Myoview. CT was negative for pulmonary embolism. GI workup has not demonstrated clear reason for her chest pain. It has been 12 years since she had a cardiac cath.  I think we need to definitively rule out CAD.   -  Increase Toprol-XL to 25 mg QAM and 12.5 mg QPM  -  Arrange  Coronary CTA to rule out obstructive CAD   -  FU with Dr. Cristopher Peru next month as planned.   2. HTN - Controlled.   3. HL - Continue statin.   4. GERD - Continue PPI.     Medication Adjustments/Labs and Tests Ordered: Current medicines are reviewed at length with the patient today.  Concerns regarding medicines are outlined above.  Medication changes, Labs and Tests ordered today are outlined in the Patient Instructions noted below. Patient Instructions  Medication Instructions:  1. INCREASE TOPROL XL TO 25 MG IN THE MORNING AND 12.5 MG IN THE PM  Labwork: NONE  Testing/Procedures: CT Heart Morp W/Cta Cor W/Score W/Ca W/Cm &/Or Wo/Cm; THIS IS TO BE DONE AT [facility]; R/O CAD  Follow-Up: KEEP YOUR APPT WITH DR. Lovena Le 07/23/15  Any Other Special Instructions Will Be Listed Below (If Applicable).   If you need a refill on your cardiac medications before your next appointment, please call your pharmacy.   Signed, Richardson Dopp, PA-C  06/18/2015 4:30 PM    [facility] [address], [place], [place]  [address] Phone: [phone]; Fax: [phone]

## 2015-06-18 ENCOUNTER — Encounter: Payer: Self-pay | Admitting: Physician Assistant

## 2015-06-18 ENCOUNTER — Ambulatory Visit (INDEPENDENT_AMBULATORY_CARE_PROVIDER_SITE_OTHER): Payer: BC Managed Care – PPO | Admitting: Physician Assistant

## 2015-06-18 VITALS — BP 120/60 | HR 80 | Ht 61.0 in | Wt 165.4 lb

## 2015-06-18 DIAGNOSIS — K219 Gastro-esophageal reflux disease without esophagitis: Secondary | ICD-10-CM

## 2015-06-18 DIAGNOSIS — E7800 Pure hypercholesterolemia, unspecified: Secondary | ICD-10-CM | POA: Diagnosis not present

## 2015-06-18 DIAGNOSIS — R079 Chest pain, unspecified: Secondary | ICD-10-CM | POA: Diagnosis not present

## 2015-06-18 DIAGNOSIS — I10 Essential (primary) hypertension: Secondary | ICD-10-CM | POA: Diagnosis not present

## 2015-06-18 MED ORDER — METOPROLOL SUCCINATE ER 25 MG PO TB24: 25.0000 mg | ORAL_TABLET | ORAL | Status: DC

## 2015-06-18 NOTE — Patient Instructions (Addendum)
Medication Instructions:  1. INCREASE TOPROL XL TO 25 MG IN THE MORNING AND 12.5 MG IN THE PM  Labwork: NONE  Testing/Procedures: CT Heart Morp W/Cta Cor W/Score W/Ca W/Cm &/Or Wo/Cm; THIS IS TO BE DONE AT [facility]; R/O CAD  Follow-Up: KEEP YOUR APPT WITH DR. Lovena Le 07/23/15  Any Other Special Instructions Will Be Listed Below (If Applicable).   If you need a refill on your cardiac medications before your next appointment, please call your pharmacy.

## 2015-06-25 ENCOUNTER — Encounter: Payer: Self-pay | Admitting: Internal Medicine

## 2015-07-02 ENCOUNTER — Encounter (HOSPITAL_COMMUNITY): Payer: Self-pay

## 2015-07-02 ENCOUNTER — Ambulatory Visit (HOSPITAL_COMMUNITY)
Admission: RE | Admit: 2015-07-02 | Discharge: 2015-07-02 | Disposition: A | Discharge status: Home / Self Care | Payer: BC Managed Care – PPO | Source: Ambulatory Visit | Attending: Physician Assistant | Admitting: Physician Assistant

## 2015-07-02 DIAGNOSIS — M47894 Other spondylosis, thoracic region: Secondary | ICD-10-CM | POA: Diagnosis not present

## 2015-07-02 DIAGNOSIS — R079 Chest pain, unspecified: Secondary | ICD-10-CM

## 2015-07-02 DIAGNOSIS — R072 Precordial pain: Secondary | ICD-10-CM | POA: Diagnosis not present

## 2015-07-02 MED ORDER — METOPROLOL TARTRATE 1 MG/ML IV SOLN
INTRAVENOUS | Status: AC
  Administered 2015-07-02: 5 mg via INTRAVENOUS
  Filled 2015-07-02: qty 15

## 2015-07-02 MED ORDER — IOHEXOL 350 MG/ML SOLN
80.0000 mL | Freq: Once | INTRAVENOUS | Status: AC | PRN
  Administered 2015-07-02: 80 mL via INTRAVENOUS

## 2015-07-02 MED ORDER — METOPROLOL TARTRATE 1 MG/ML IV SOLN
INTRAVENOUS | Status: AC
  Filled 2015-07-02: qty 5

## 2015-07-02 MED ORDER — METOPROLOL TARTRATE 1 MG/ML IV SOLN
5.0000 mg | INTRAVENOUS | Status: DC | PRN
  Administered 2015-07-02 (×3): 5 mg via INTRAVENOUS

## 2015-07-03 ENCOUNTER — Encounter: Payer: Self-pay | Admitting: Physician Assistant

## 2015-07-03 ENCOUNTER — Telehealth: Payer: Self-pay | Admitting: *Deleted

## 2015-07-03 NOTE — Telephone Encounter (Signed)
pt has been notified of normal CT Heart Morp W/Cta Cor W/Score W/Ca W/Cm &/Or Wo/Cm. pt advised to keep appt with Dr. Lovena Le 07/27/15 @ 8:30. Pt agreeable to plan of care.

## 2015-07-23 ENCOUNTER — Telehealth: Payer: Self-pay | Admitting: *Deleted

## 2015-07-23 ENCOUNTER — Encounter: Payer: Self-pay | Admitting: Internal Medicine

## 2015-07-23 ENCOUNTER — Ambulatory Visit (INDEPENDENT_AMBULATORY_CARE_PROVIDER_SITE_OTHER): Payer: BC Managed Care – PPO | Admitting: Internal Medicine

## 2015-07-23 VITALS — BP 138/70 | HR 92 | Ht 61.5 in | Wt 163.8 lb

## 2015-07-23 DIAGNOSIS — R002 Palpitations: Secondary | ICD-10-CM | POA: Diagnosis not present

## 2015-07-23 MED ORDER — ATORVASTATIN CALCIUM 40 MG PO TABS: 40.0000 mg | ORAL_TABLET | Freq: Every day | ORAL | Status: DC

## 2015-07-23 MED ORDER — VERAPAMIL HCL ER 120 MG PO TBCR: 120.0000 mg | EXTENDED_RELEASE_TABLET | Freq: Every day | ORAL | Status: DC

## 2015-07-23 NOTE — Progress Notes (Signed)
    HPI Mrs. Fearnow return today for followup. She is a very pleasant 49-year-old woman with a long history of chest pain, and catheterization on 2 different occasions demonstrating no obstructive coronary disease. The patient has had palpitations which occur very infrequently. She exerts herself on a regular basis and has no trouble walking on flat ground. She underwent a CT of the heart several weeks ago after being seen by Scott Weaver for chest pain. She is still having symptoms. Her blood pressure has not been well controlled.  Allergies  Allergen Reactions  . Pumpkin Flavor Anaphylaxis    Squash also  . Salmon [Fish Allergy] Anaphylaxis  . Sulfonamide Derivatives Other (See Comments)    REACTION: GI distress  . Morphine And Related Rash     Current Outpatient Prescriptions  Medication Sig Dispense Refill  . Ascorbic Acid (VITAMIN C) 500 MG tablet Take 500 mg by mouth daily.      . aspirin 81 MG tablet Take 81 mg by mouth at bedtime.     . Calcium Carbonate (CALCIUM 500 PO) Take 2 tablets by mouth daily.      . canagliflozin (INVOKANA) 100 MG TABS tablet Take 100 mg by mouth daily.    . cetirizine (ZYRTEC) 10 MG tablet Take 10 mg by mouth at bedtime.     . Cyanocobalamin (VITAMIN B 12) 250 MCG LOZG Take 2 tablets by mouth daily.     . Insulin Infusion Pump KIT as directed.     . insulin lispro (HUMALOG) 100 UNIT/ML injection Inject 46.85 Units into the skin as directed. 1.6 units at 12 am and 6am 1.725 units ,12pm 2.3 units  3pm 2.0  Units  and 8 pm 2.5 units  total of 46.85 units daily-BASIL RATE IN INSULIN PUMP    . metoprolol succinate (TOPROL-XL) 25 MG 24 hr tablet Take 1 tablet (25 mg total) by mouth as directed. 25 mg AM and 12.5 mg PM 45 tablet 11  . Multiple Vitamin (MULTIVITAMIN) tablet Take 1 tablet by mouth daily.      . nitroGLYCERIN (NITROSTAT) 0.4 MG SL tablet Place 1 tablet (0.4 mg total) under the tongue every 5 (five) minutes as needed for chest pain. 30 tablet 12    . pantoprazole (PROTONIX) 40 MG tablet Take 40 mg by mouth 2 (two) times daily. Takes every morning, can take a second time if needed for acid    . ramipril (ALTACE) 2.5 MG capsule Take 2.5 mg by mouth at bedtime.    . sertraline (ZOLOFT) 100 MG tablet Take 100 mg by mouth daily before breakfast.    . simvastatin (ZOCOR) 80 MG tablet Take 80 mg by mouth daily at 6 PM.     . sucralfate (CARAFATE) 1 G tablet Take 1 g by mouth daily as needed (stomach upset). GAS    . ZETIA 10 MG tablet TAKE 1 TAB BY MOUTH ONCE DAILY AT BEDTIME  3   No current facility-administered medications for this visit.     Past Medical History  Diagnosis Date  . DM (diabetes mellitus) (HCC)   . Dyslipidemia   . Chest pain     RECURRENT WITH NEGITIVE CATHERIZATIONS  . Anxiety   . Fatty liver   . GERD (gastroesophageal reflux disease)   . Complication of anesthesia     aspirated and "gained 15pounds, ended up in ICU"  . Pulmonary embolus (HCC) 2002  . Tachycardia   . Hypertension   . Hypercholesteremia   .   Obesity   . History of CT scan     Coronary CTA 3/17: Calcium score 0; no plaque or stenosis noted in coronary arteries    ROS:   All systems reviewed and negative except as noted in the HPI.   Past Surgical History  Procedure Laterality Date  . Carpal tunnel release      right  . Fibroid tumor      UTERUS SURGERY  . Laparoscopic hysterectomy    . Cholecystectomy  03/24/2012    Procedure: LAPAROSCOPIC CHOLECYSTECTOMY WITH INTRAOPERATIVE CHOLANGIOGRAM;  Surgeon: Edward Jolly, MD;  Location: [facility];  Service: General;  Laterality: N/A;  . Cardiac catheterization      x2  . Esophageal manometry N/A 05/28/2015    Procedure: ESOPHAGEAL MANOMETRY (EM);  Surgeon: Irene Shipper, MD;  Location: [facility];  Service: Gastroenterology;  Laterality: N/A;     Family History  Problem Relation Age of Onset  . Colon cancer Neg Hx   . Irritable bowel syndrome Father   . Gallbladder disease Mother    . Gallbladder disease      3 mat. uncles  . Cancer Maternal Grandmother     ovarian     Social History   Social History  . Marital Status: Single    Spouse Name: N/A  . Number of Children: 0  . Years of Education: N/A   Occupational History  . registered dietician    Social History Main Topics  . Smoking status: Never Smoker   . Smokeless tobacco: Never Used  . Alcohol Use: Yes     Comment: rarely  . Drug Use: No  . Sexual Activity: Not on file   Other Topics Concern  . Not on file   Social History Narrative     BP 138/70 mmHg  Pulse 92  Ht 5' 1.5" (1.562 m)  Wt 163 lb 12.8 oz (74.299 kg)  BMI 30.45 kg/m2  SpO2 96%  Physical Exam:  obese appearing middle-aged woman, NAD HEENT: Unremarkable Neck:  6 cm JVD, no thyromegally Lymphatics:  No adenopathy Back:  No CVA tenderness Lungs:  Clear, with no wheezes, rales, or rhonchi. HEART:  Regular rate rhythm, no murmurs, no rubs, no clicks Abd:  soft, obese, positive bowel sounds, no organomegally, no rebound, no guarding Ext:  2 plus pulses, no edema, no cyanosis, no clubbing Skin:  No rashes no nodules Neuro:  CN II through XII intact, motor grossly intact  ecg - NSR   Assess/Plan: 1. Chest pain - her symptoms are not due to epicardial CAD.  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 Will stop toprol and start verapamil.  2. Palpitations - she has sinus tachycardia. Hopefully this will improve after we start verapamil. 3. HTN - her blood pressure has been a bit high. Will switch to verapamil and plan to slowly uptitrate. 4. Dyspnea - her symptoms persist. I suspect she has diastolic dysfunction. She is encouraged to continue to lose weight and to maintain a low sodium diet.  Mikle Bosworth.D.

## 2015-07-23 NOTE — Telephone Encounter (Signed)
[facility] calling about possible drug interacting between Verapamil and Simvastatin. They would like Dr. Lovena Le to take a look and his nurse call back to let them know to further refill the medication or if they are going to switch.

## 2015-07-23 NOTE — Telephone Encounter (Signed)
Will change her Simvastatin to Lipitor 40 mg daily

## 2015-07-23 NOTE — Patient Instructions (Addendum)
Medication Instructions:  Your physician has recommended you make the following change in your medication:  1) Stop Metoprolol 2) Start Verapamil 120 mg daily   Labwork: None ordered   Testing/Procedures: None ordered   Follow-Up: Your physician recommends that you schedule a follow-up appointment in: 2 weeks for BP and EKG ----08/08/15  keep log of BP's and bring with you   Your physician wants you to follow-up in: 6 months with Dr Knox Saliva will receive a reminder letter in the mail two months in advance. If you don't receive a letter, please call our office to schedule the follow-up appointment.    Any Other Special Instructions Will Be Listed Below (If Applicable).     If you need a refill on your cardiac medications before your next appointment, please call your pharmacy.

## 2015-08-08 ENCOUNTER — Ambulatory Visit (INDEPENDENT_AMBULATORY_CARE_PROVIDER_SITE_OTHER): Payer: BC Managed Care – PPO

## 2015-08-08 VITALS — BP 112/76 | HR 83 | Ht 61.5 in | Wt 163.8 lb

## 2015-08-08 DIAGNOSIS — R002 Palpitations: Secondary | ICD-10-CM | POA: Diagnosis not present

## 2015-08-08 NOTE — Progress Notes (Signed)
The pt came into the office for BP check and EKG.  Since the pt started Verapamil she has noticed improvement in her chest pain and the sensation of feeling her heart beat has improved.  The pt has monitored her BP at home and brought readings in for Dr Lovena Le to review. She also brought her home cuff and this read 129/76.  Per my check the pt's BP was 112/76.  The pt does have some diastolic readings above [id number] and this is when she notices her chest pain. EKG performed and shows NSR 83. I will forward this message to Dr Lovena Le for review. The pt said the only side effect that she has noticed is constipation which is manageable at this time.

## 2015-08-08 NOTE — Patient Instructions (Signed)
The office will contact you next week after Dr Lovena Le reviews your EKG and BP readings with any new recommendations.

## 2015-09-17 ENCOUNTER — Telehealth: Payer: Self-pay | Admitting: Internal Medicine

## 2015-09-17 NOTE — Telephone Encounter (Signed)
New message     Pt c/o medication issue:  1. Name of Medication: lipitor  2. How are you currently taking this medication (dosage and times per day)? Pt uncertain  3. Are you having a reaction (difficulty breathing--STAT)? Per pt the Muscle cramping and joint pain are getting worse  4. What is your medication issue? The pt wants to know  If the Md can change the lipitor to something else.     Pt c/o medication issue:  1. Name of Medication: Verapamil  2. How are you currently taking this medication (dosage and times per day)? Pt uncertain  3. Are you having a reaction (difficulty breathing--STAT)? no  4. What is your medication issue? The pt states the MD had talked about increasing the strength on the medication. But the pt has not heard back from the physician.

## 2015-09-18 NOTE — Telephone Encounter (Signed)
Discussed with Dr Lovena Le. Left message for patient to stop Lipitor for 4 weeks to see if joint pain and muscle cramping gets better.  If it does then we will chenge medication to Crestor 5 mg.  If no better not the Lipitor and resume.  As far as the Verapamil, when she was here for her EKG her reviewed and did not want to increase at this time.

## 2015-12-18 ENCOUNTER — Telehealth: Payer: Self-pay | Admitting: Internal Medicine

## 2015-12-18 NOTE — Telephone Encounter (Signed)
New Message:     Her Bystolic pressure is up again,she said she was supposed to let you know when this happen.

## 2015-12-18 NOTE — Telephone Encounter (Signed)
Fri night she woke up and her BP 165/90 HR 117, 139/76 111, 159/75 82,  This morning woke up at 4am felt fluttering 123/65 85.  She is taking her Verapamil 120 mg every night.  She says she had the same chest heaviness as before with both episodes.  She feels fine now but I let her know I would discuss with Dr Lovena Le

## 2015-12-21 ENCOUNTER — Observation Stay (HOSPITAL_COMMUNITY)
Admission: EM | Admit: 2015-12-21 | Discharge: 2015-12-23 | Disposition: A | Discharge status: Home / Self Care | Payer: BC Managed Care – PPO | Attending: Internal Medicine | Admitting: Internal Medicine

## 2015-12-21 ENCOUNTER — Telehealth: Payer: Self-pay | Admitting: Internal Medicine

## 2015-12-21 ENCOUNTER — Emergency Department (HOSPITAL_COMMUNITY): Payer: BC Managed Care – PPO

## 2015-12-21 ENCOUNTER — Encounter (HOSPITAL_COMMUNITY): Payer: Self-pay | Admitting: Physical Medicine and Rehabilitation

## 2015-12-21 DIAGNOSIS — E785 Hyperlipidemia, unspecified: Secondary | ICD-10-CM | POA: Diagnosis not present

## 2015-12-21 DIAGNOSIS — R079 Chest pain, unspecified: Secondary | ICD-10-CM | POA: Diagnosis present

## 2015-12-21 DIAGNOSIS — Z79899 Other long term (current) drug therapy: Secondary | ICD-10-CM | POA: Diagnosis not present

## 2015-12-21 DIAGNOSIS — K219 Gastro-esophageal reflux disease without esophagitis: Secondary | ICD-10-CM | POA: Insufficient documentation

## 2015-12-21 DIAGNOSIS — R0789 Other chest pain: Secondary | ICD-10-CM | POA: Diagnosis not present

## 2015-12-21 DIAGNOSIS — E669 Obesity, unspecified: Secondary | ICD-10-CM | POA: Diagnosis not present

## 2015-12-21 DIAGNOSIS — R002 Palpitations: Secondary | ICD-10-CM | POA: Diagnosis not present

## 2015-12-21 DIAGNOSIS — I10 Essential (primary) hypertension: Secondary | ICD-10-CM | POA: Diagnosis not present

## 2015-12-21 DIAGNOSIS — Z6831 Body mass index (BMI) 31.0-31.9, adult: Secondary | ICD-10-CM | POA: Diagnosis not present

## 2015-12-21 DIAGNOSIS — Z7982 Long term (current) use of aspirin: Secondary | ICD-10-CM | POA: Insufficient documentation

## 2015-12-21 DIAGNOSIS — Z9641 Presence of insulin pump (external) (internal): Secondary | ICD-10-CM

## 2015-12-21 DIAGNOSIS — E109 Type 1 diabetes mellitus without complications: Secondary | ICD-10-CM | POA: Diagnosis present

## 2015-12-21 DIAGNOSIS — Z86711 Personal history of pulmonary embolism: Secondary | ICD-10-CM | POA: Diagnosis not present

## 2015-12-21 DIAGNOSIS — E7800 Pure hypercholesterolemia, unspecified: Secondary | ICD-10-CM | POA: Diagnosis not present

## 2015-12-21 DIAGNOSIS — Z794 Long term (current) use of insulin: Secondary | ICD-10-CM | POA: Insufficient documentation

## 2015-12-21 LAB — COMPREHENSIVE METABOLIC PANEL
ALBUMIN: 3.7 g/dL (ref 3.5–5.0)
ALK PHOS: 41 U/L (ref 38–126)
ALT: 13 U/L — ABNORMAL LOW (ref 14–54)
ANION GAP: 7 (ref 5–15)
AST: 14 U/L — ABNORMAL LOW (ref 15–41)
BILIRUBIN TOTAL: 0.4 mg/dL (ref 0.3–1.2)
BUN: 10 mg/dL (ref 6–20)
CALCIUM: 9.1 mg/dL (ref 8.9–10.3)
CO2: 25 mmol/L (ref 22–32)
Chloride: 104 mmol/L (ref 101–111)
Creatinine, Ser: 0.96 mg/dL (ref 0.44–1.00)
GFR calc non Af Amer: >60 mL/min (ref >60)
GLUCOSE: 240 mg/dL — AB (ref 65–99)
POTASSIUM: 3.6 mmol/L (ref 3.5–5.1)
Sodium: 136 mmol/L (ref 135–145)
TOTAL PROTEIN: 6.5 g/dL (ref 6.5–8.1)

## 2015-12-21 LAB — CBC WITH DIFFERENTIAL/PLATELET
BASOS PCT: 0 %
Basophils Absolute: 0 10*3/uL (ref 0.0–0.1)
EOS ABS: 0.1 10*3/uL (ref 0.0–0.7)
Eosinophils Relative: 1 %
HEMATOCRIT: 41.4 % (ref 36.0–46.0)
Hemoglobin: 13.6 g/dL (ref 12.0–15.0)
LYMPHS ABS: 1.2 10*3/uL (ref 0.7–4.0)
Lymphocytes Relative: 18 %
MCH: 29.3 pg (ref 26.0–34.0)
MCHC: 32.9 g/dL (ref 30.0–36.0)
MCV: 89.2 fL (ref 78.0–100.0)
MONO ABS: 0.4 10*3/uL (ref 0.1–1.0)
MONOS PCT: 6 %
NEUTROS ABS: 5.2 10*3/uL (ref 1.7–7.7)
Neutrophils Relative %: 75 %
Platelets: 254 10*3/uL (ref 150–400)
RBC: 4.64 MIL/uL (ref 3.87–5.11)
RDW: 12.8 % (ref 11.5–15.5)
WBC: 6.9 10*3/uL (ref 4.0–10.5)

## 2015-12-21 LAB — I-STAT TROPONIN, ED
TROPONIN I, POC: 0 ng/mL (ref 0.00–0.08)
TROPONIN I, POC: 0.01 ng/mL (ref 0.00–0.08)

## 2015-12-21 LAB — CBG MONITORING, ED
GLUCOSE-CAPILLARY: 239 mg/dL — AB (ref 65–99)
Glucose-Capillary: 152 mg/dL — ABNORMAL HIGH (ref 65–99)

## 2015-12-21 LAB — D-DIMER, QUANTITATIVE: D-Dimer, Quant: <0.27 ug/mL-FEU (ref 0.00–0.50)

## 2015-12-21 LAB — TROPONIN I

## 2015-12-21 MED ORDER — ASPIRIN 81 MG PO CHEW
324.0000 mg | CHEWABLE_TABLET | Freq: Once | ORAL | Status: AC
Start: 2015-12-21 — End: 2015-12-21
  Administered 2015-12-21: 324 mg via ORAL
  Filled 2015-12-21: qty 4

## 2015-12-21 MED ORDER — DEXTROSE 5 % IV SOLN
500.0000 mg | Freq: Once | INTRAVENOUS | Status: AC
  Administered 2015-12-22: 500 mg via INTRAVENOUS
  Filled 2015-12-21: qty 5

## 2015-12-21 MED ORDER — GI COCKTAIL ~~LOC~~
30.0000 mL | Freq: Once | ORAL | Status: AC
  Administered 2015-12-21: 30 mL via ORAL
  Filled 2015-12-21: qty 30

## 2015-12-21 MED ORDER — FENTANYL CITRATE (PF) 100 MCG/2ML IJ SOLN
50.0000 ug | INTRAMUSCULAR | Status: AC | PRN
  Administered 2015-12-21 (×2): 50 ug via INTRAVENOUS
  Filled 2015-12-21: qty 2

## 2015-12-21 MED ORDER — NITROGLYCERIN 0.4 MG SL SUBL
0.4000 mg | SUBLINGUAL_TABLET | SUBLINGUAL | Status: DC | PRN
  Administered 2015-12-21: 0.4 mg via SUBLINGUAL
  Filled 2015-12-21: qty 1

## 2015-12-21 MED ORDER — NITROGLYCERIN 2 % TD OINT
1.0000 [in_us] | TOPICAL_OINTMENT | Freq: Once | TRANSDERMAL | Status: AC
  Administered 2015-12-21: 1 [in_us] via TOPICAL
  Filled 2015-12-21: qty 1

## 2015-12-21 MED ORDER — FENTANYL CITRATE (PF) 100 MCG/2ML IJ SOLN: 50.0000 ug | Freq: Once | INTRAMUSCULAR | Status: DC

## 2015-12-21 NOTE — ED Provider Notes (Signed)
[facility] DEPT Provider Note   CSN: [id number] Arrival date & time: 12/21/15  1435     History   Chief Complaint Chief Complaint  Patient presents with  . Chest Pain  . Palpitations    HPI Jocelyn [name] is a 50 y.o. female.  HPI  Pt was seen at [place]. Per pt, c/o gradual onset and persistence of multiple intermittent episodes of chest "pain" for the past day. Pt states he woke up at 0300 this morning with "heavy" chest pain, palpitations, nausea and SOB. Pt states this episode resolved spontaneously after an unknown period of time (pt went back to sleep). Pt states her symptoms recurred at 1300 today, so she took her own SL Ntg with relief. Pt states her symptoms have recurred "slightly" since she has arrived to the ED. Denies cough, no back pain, no abd pain, no vomiting/diarrhea, no calf/LE pain or unilateral swelling.     Past Medical History:  Diagnosis Date  . Anxiety   . Chest pain    RECURRENT WITH NEGITIVE CATHERIZATIONS  . Complication of anesthesia    aspirated and "gained 15pounds, ended up in ICU"  . DM (diabetes mellitus) ([place])   . Dyslipidemia   . Fatty liver   . GERD (gastroesophageal reflux disease)   . History of CT scan    Coronary CTA 3/17: Calcium score 0; no plaque or stenosis noted in coronary arteries  . Hypercholesteremia   . Hypertension   . Obesity   . Pulmonary embolus ([place]) 2002  . Tachycardia     Patient Active Problem List   Diagnosis Date Noted  . Atypical chest pain   . Chest pain 04/04/2015  . Diabetes mellitus type 1 ([place]) 04/04/2015  . Insulin pump in place 04/04/2015  . Chest pressure 05/19/2012  . DYSPNEA 01/21/2010  . DIAB W/O COMP TYPE I [JUV] NOT STATED UNCNTRL 07/09/2009  . GERD 07/09/2009  . ABDOMINAL PAIN-EPIGASTRIC 07/09/2009  . Hypercholesteremia 02/08/2009  . Essential hypertension, benign 02/08/2009  . UNSPECIFIED PAROXYSMAL TACHYCARDIA 02/08/2009    Past Surgical History:  Procedure Laterality Date  .  CARDIAC CATHETERIZATION     x2  . CARPAL TUNNEL RELEASE     right  . CHOLECYSTECTOMY  03/24/2012   Procedure: LAPAROSCOPIC CHOLECYSTECTOMY WITH INTRAOPERATIVE CHOLANGIOGRAM;  Surgeon: Edward Jolly, MD;  Location: [facility] ORS;  Service: General;  Laterality: N/A;  . ESOPHAGEAL MANOMETRY N/A 05/28/2015   Procedure: ESOPHAGEAL MANOMETRY (EM);  Surgeon: Irene Shipper, MD;  Location: [facility] ENDOSCOPY;  Service: Gastroenterology;  Laterality: N/A;  . FIBROID TUMOR     UTERUS SURGERY  . LAPAROSCOPIC HYSTERECTOMY         Home Medications    Prior to Admission medications   Medication Sig Start Date End Date Taking? Authorizing Provider  Ascorbic Acid (VITAMIN C) 500 MG tablet Take 500 mg by mouth daily.      Historical Provider, MD  aspirin 81 MG tablet Take 81 mg by mouth at bedtime.     Historical Provider, MD  atorvastatin (LIPITOR) 40 MG tablet Take 1 tablet (40 mg total) by mouth daily. 07/23/15   Evans Lance, MD  Calcium Carbonate (CALCIUM 500 PO) Take 2 tablets by mouth daily.      Historical Provider, MD  canagliflozin (INVOKANA) 100 MG TABS tablet Take 100 mg by mouth daily.    Historical Provider, MD  cetirizine (ZYRTEC) 10 MG tablet Take 10 mg by mouth at bedtime.     Historical Provider, MD  Cyanocobalamin (VITAMIN B 12) 250 MCG LOZG Take 2 tablets by mouth daily.     Historical Provider, MD  Insulin Infusion Pump KIT as directed.     Historical Provider, MD  insulin lispro (HUMALOG) 100 UNIT/ML injection Inject 46.85 Units into the skin as directed. 1.6 units at 12 am and 6am 1.725 units ,12pm 2.3 units  3pm 2.0  Units  and 8 pm 2.5 units  total of 46.85 units daily-BASIL RATE IN INSULIN PUMP    Historical Provider, MD  Multiple Vitamin (MULTIVITAMIN) tablet Take 1 tablet by mouth daily.      Historical Provider, MD  nitroGLYCERIN (NITROSTAT) 0.4 MG SL tablet Place 1 tablet (0.4 mg total) under the tongue every 5 (five) minutes as needed for chest pain. 04/05/15   Ripudeep Krystal Eaton, MD   pantoprazole (PROTONIX) 40 MG tablet Take 40 mg by mouth 2 (two) times daily. Takes every morning, can take a second time if needed for acid    Historical Provider, MD  ramipril (ALTACE) 2.5 MG capsule Take 2.5 mg by mouth at bedtime.    Historical Provider, MD  sertraline (ZOLOFT) 100 MG tablet Take 100 mg by mouth daily before breakfast.    Historical Provider, MD  sucralfate (CARAFATE) 1 G tablet Take 1 g by mouth daily as needed (stomach upset). GAS    Historical Provider, MD  verapamil (CALAN-SR) 120 MG CR tablet Take 1 tablet (120 mg total) by mouth at bedtime. 07/23/15   Evans Lance, MD  ZETIA 10 MG tablet TAKE 1 TAB BY MOUTH ONCE DAILY AT BEDTIME 10/12/14   Historical Provider, MD    Family History Family History  Problem Relation Age of Onset  . Irritable bowel syndrome Father   . Gallbladder disease Mother   . Gallbladder disease      3 mat. uncles  . Cancer Maternal Grandmother     ovarian  . Colon cancer Neg Hx     Social History Social History  Substance Use Topics  . Smoking status: Never Smoker  . Smokeless tobacco: Never Used  . Alcohol use Yes     Comment: rarely     Allergies   Pumpkin flavor; Salmon [fish allergy]; Sulfonamide derivatives; and Morphine and related   Review of Systems Review of Systems ROS: Statement: All systems negative except as marked or noted in the HPI; Constitutional: Negative for fever and chills. ; ; Eyes: Negative for eye pain, redness and discharge. ; ; ENMT: Negative for ear pain, hoarseness, nasal congestion, sinus pressure and sore throat. ; ; Cardiovascular: +CP, SOB, palpitations. Negative for diaphoresis, and peripheral edema. ; ; Respiratory: Negative for cough, wheezing and stridor. ; ; Gastrointestinal: Negative for nausea, vomiting, diarrhea, abdominal pain, blood in stool, hematemesis, jaundice and rectal bleeding. . ; ; Genitourinary: Negative for dysuria, flank pain and hematuria. ; ; Musculoskeletal: Negative for back  pain and neck pain. Negative for swelling and trauma.; ; Skin: Negative for pruritus, rash, abrasions, blisters, bruising and skin lesion.; ; Neuro: Negative for headache, lightheadedness and neck stiffness. Negative for weakness, altered level of consciousness, altered mental status, extremity weakness, paresthesias, involuntary movement, seizure and syncope.       Physical Exam Updated Vital Signs BP 124/64   Pulse 86   Temp 98.4 F (36.9 C) (Oral)   Resp 18   Wt 165 lb 6.4 oz (75 kg)   SpO2 100%   BMI 30.75 kg/m   BP 125/74   Pulse 78  Temp 98.4 F (36.9 C) (Oral)   Resp 22   Wt 165 lb 6.4 oz (75 kg)   SpO2 100%   BMI 30.75 kg/m    Physical Exam 1840: Physical examination:  Nursing notes reviewed; Vital signs and O2 SAT reviewed;  Constitutional: Well developed, Well nourished, Well hydrated, In no acute distress; Head:  Normocephalic, atraumatic; Eyes: EOMI, PERRL, No scleral icterus; ENMT: Mouth and pharynx normal, Mucous membranes moist; Neck: Supple, Full range of motion, No lymphadenopathy; Cardiovascular: Regular rate and rhythm, No gallop; Respiratory: Breath sounds clear & equal bilaterally, No wheezes.  Speaking full sentences with ease, Normal respiratory effort/excursion; Chest: Nontender, Movement normal; Abdomen: Soft, Nontender, Nondistended, Normal bowel sounds; Genitourinary: No CVA tenderness; Extremities: Pulses normal, No tenderness, No edema, No calf edema or asymmetry.; Neuro: AA&Ox3, rambling/vague historian. Major CN grossly intact.  Speech clear. No gross focal motor or sensory deficits in extremities.; Skin: Color normal, Warm, Dry.   ED Treatments / Results  Labs (all labs ordered are listed, but only abnormal results are displayed)   EKG  EKG Interpretation  Date/Time:  Friday December 21 2015 14:40:48 EDT Ventricular Rate:  102 PR Interval:  132 QRS Duration: 88 QT Interval:  344 QTC Calculation: 448 R Axis:   46 Text Interpretation:   Sinus tachycardia Otherwise normal ECG When compared with ECG of 04/05/2015 No significant change was found Confirmed by [facility]  MD, Nicholos Johns [phone]) on 12/21/2015 6:50:44 PM       Radiology   Procedures Procedures (including critical care time)  Medications Ordered in ED Medications  aspirin chewable tablet 324 mg (not administered)  nitroGLYCERIN (NITROSTAT) SL tablet 0.4 mg (not administered)     Initial Impression / Assessment and Plan / ED Course  I have reviewed the triage vital signs and the nursing notes.  Pertinent labs & imaging results that were available during my care of the patient were reviewed by me and considered in my medical decision making (see chart for details).  MDM Reviewed: previous chart, vitals and nursing note Reviewed previous: labs and ECG Interpretation: labs, ECG and x-ray    Results for orders placed or performed during the hospital encounter of 12/21/15  CBC with Differential  Result Value Ref Range   WBC 6.9 4.0 - 10.5 K/uL   RBC 4.64 3.87 - 5.11 MIL/uL   Hemoglobin 13.6 12.0 - 15.0 g/dL   HCT 21.2 99.8 - 85.8 %   MCV 89.2 78.0 - 100.0 fL   MCH 29.3 26.0 - 34.0 pg   MCHC 32.9 30.0 - 36.0 g/dL   RDW 98.8 71.5 - 50.9 %   Platelets 254 150 - 400 K/uL   Neutrophils Relative % 75 %   Neutro Abs 5.2 1.7 - 7.7 K/uL   Lymphocytes Relative 18 %   Lymphs Abs 1.2 0.7 - 4.0 K/uL   Monocytes Relative 6 %   Monocytes Absolute 0.4 0.1 - 1.0 K/uL   Eosinophils Relative 1 %   Eosinophils Absolute 0.1 0.0 - 0.7 K/uL   Basophils Relative 0 %   Basophils Absolute 0.0 0.0 - 0.1 K/uL  Comprehensive metabolic panel  Result Value Ref Range   Sodium 136 135 - 145 mmol/L   Potassium 3.6 3.5 - 5.1 mmol/L   Chloride 104 101 - 111 mmol/L   CO2 25 22 - 32 mmol/L   Glucose, Bld 240 (H) 65 - 99 mg/dL   BUN 10 6 - 20 mg/dL   Creatinine, Ser 5.73 0.44 - 1.00  mg/dL   Calcium 9.1 8.9 - 10.3 mg/dL   Total Protein 6.5 6.5 - 8.1 g/dL   Albumin 3.7 3.5 - 5.0  g/dL   AST 14 (L) 15 - 41 U/L   ALT 13 (L) 14 - 54 U/L   Alkaline Phosphatase 41 38 - 126 U/L   Total Bilirubin 0.4 0.3 - 1.2 mg/dL   GFR calc non Af Amer >60 >60 mL/min   GFR calc Af Amer >60 >60 mL/min   Anion gap 7 5 - 15  I-Stat Troponin, ED - 0, 3, 6 hours (not at [facility])  Result Value Ref Range   Troponin i, poc 0.00 0.00 - 0.08 ng/mL   Comment 3          POC CBG, ED  Result Value Ref Range   Glucose-Capillary 239 (H) 65 - 99 mg/dL   Dg Chest 2 View Result Date: 12/21/2015 CLINICAL DATA:  Chest pain and cardiac palpitations for 1 week. Shortness of breath EXAM: CHEST  2 VIEW COMPARISON:  Chest radiograph April 04, 2015 and chest CT April 04, 2015 FINDINGS: There is no appreciable edema or consolidation. The heart size and pulmonary vascularity are normal. No adenopathy. No pneumothorax. No bone lesions evident. IMPRESSION: No edema or consolidation. Electronically Signed   By: Lowella Grip III M.D.   On: 12/21/2015 15:24    2020:  ASA, SL morphine given with improvement of her symptoms. Dx and testing d/w pt and family.  Questions answered.  Verb understanding, agreeable to observation admit. T/C to [facility] Dr. Harlin Heys, case discussed, including:  HPI, pertinent PM/SHx, VS/PE, dx testing, ED course and treatment:  Agreeable to amit, requests she will come to the ED for evaluation.    Final Clinical Impressions(s) / ED Diagnoses   Final diagnoses:  None    New Prescriptions New Prescriptions   No medications on file     Francine Graven, DO 12/25/15 1639

## 2015-12-21 NOTE — Telephone Encounter (Signed)
Alan Mulder at 12/21/2015 8:25 AM   Status: Signed    New Message  Patient c/o Palpitations:  High priority if patient c/o lightheadedness and shortness of breath.  1. How long have you been having palpitations? A week  2. Are you currently experiencing lightheadedness and shortness of breath? No  3. Have you checked your BP and heart rate? (document readings) No  4. Are you experiencing any other symptoms? Pt voiced she still have the heart flutters in the middle of the night and it's waking the pt up.

## 2015-12-21 NOTE — Telephone Encounter (Signed)
This encounter was created in error - please disregard.

## 2015-12-21 NOTE — ED Triage Notes (Signed)
Pt reports midsternal chest pain and palpitations x1 week. Also states SOB. Called Cardiology, hasn't heard back yet. 9/10 chest pain upon arrival to ED. Did take Nitroglycerin at home with relief.

## 2015-12-21 NOTE — Telephone Encounter (Signed)
Pt advised to take an additional metoprolol at night if needed for symptoms. Will discuss with Dr. Lovena Le Monday upon his return. Pt can call back with concerns.

## 2015-12-21 NOTE — Addendum Note (Signed)
Addended by: Janan Halter F on: 12/21/2015 08:37 AM   Modules accepted: Miquel Dunn

## 2015-12-21 NOTE — H&P (Addendum)
Jocelyn [name] OBS:[id number] DOB: 1965-05-18 DOA: 12/21/2015     PCP: Haywood Pao, MD   Outpatient Specialists: cardiology Jocelyn [name] Patient coming from:    home Lives alone,        Chief Complaint: Midsternal chest pain  HPI: Jocelyn [name] is a 50 y.o. female with medical history significant of diabetes, hyperlipidemia, prior pulmonary embolism in 2002     Presented with midsternal chest pain intermittently as well as palpitations for one week associated some shortness of breath called her cardiologist. Patient experienced chest pain this morning while asleep it seemed to have resolved and she went back to bed but then the pain has resumed at 1 pm . Patient took some nitroglycerin with some relief. Given palpitations patient was advised by her cardiologist to take additional metoprolol at night. Patient describes chest pain as heavy sensation in her chest even since she arrived to emergency department she was still having some slight discomfort she had denies any recent travel no nausea no vomiting or abdominal pain no back pain no cough no lower extremity pain.  Reports pain was worse with deep inspiration repors pain about 4/10. Pressing does not make it worse. Pain described as constant associated with some heaviness.    Regarding pertinent Chronic problems: She has known history of recurrent chest pain. Negative catheterizations she has known history of diabetes mellitus   IN ER:  Temp (24hrs), Avg:98.3 F (36.8 C), Min:98.1 F (36.7 C), Max:98.4 F (36.9 C)   Satting 100% room air blood pressure 127/69 heart rate 86 Following Medications were ordered in ER: Medications  nitroGLYCERIN (NITROSTAT) SL tablet 0.4 mg (0.4 mg Sublingual Given 12/21/15 1944)  aspirin chewable tablet 324 mg (324 mg Oral Given 12/21/15 1941)    Hospitalist was called for admission for Chest pain evaluation  Review of Systems:    Pertinent positives include:  chest pain,  Constitutional:    No weight loss, night sweats, Fevers, chills, fatigue, weight loss  HEENT:  No headaches, Difficulty swallowing,Tooth/dental problems,Sore throat,  No sneezing, itching, ear ache, nasal congestion, post nasal drip,  Cardio-vascular:  No Orthopnea, PND, anasarca, dizziness, palpitations.no Bilateral lower extremity swelling  GI:  No heartburn, indigestion, abdominal pain, nausea, vomiting, diarrhea, change in bowel habits, loss of appetite, melena, blood in stool, hematemesis Resp:  no shortness of breath at rest. No dyspnea on exertion, No excess mucus, no productive cough, No non-productive cough, No coughing up of blood.No change in color of mucus.No wheezing. Skin:  no rash or lesions. No jaundice GU:  no dysuria, change in color of urine, no urgency or frequency. No straining to urinate.  No flank pain.  Musculoskeletal:  No joint pain or no joint swelling. No decreased range of motion. No back pain.  Psych:  No change in mood or affect. No depression or anxiety. No memory loss.  Neuro: no localizing neurological complaints, no tingling, no weakness, no double vision, no gait abnormality, no slurred speech, no confusion  As per HPI otherwise 10 point review of systems negative.   Past Medical History: Past Medical History:  Diagnosis Date  . Anxiety   . Chest pain    RECURRENT WITH NEGITIVE CATHERIZATIONS  . Complication of anesthesia    aspirated and "gained 15pounds, ended up in ICU"  . DM (diabetes mellitus) ([place])   . Dyslipidemia   . Fatty liver   . GERD (gastroesophageal reflux disease)   . History of CT scan  Coronary CTA 3/17: Calcium score 0; no plaque or stenosis noted in coronary arteries  . Hypercholesteremia   . Hypertension   . Obesity   . Pulmonary embolus ([place]) 2002  . Tachycardia    Past Surgical History:  Procedure Laterality Date  . CARDIAC CATHETERIZATION     x2  . CARPAL TUNNEL RELEASE     right  . CHOLECYSTECTOMY  03/24/2012   Procedure:  LAPAROSCOPIC CHOLECYSTECTOMY WITH INTRAOPERATIVE CHOLANGIOGRAM;  Surgeon: Edward Jolly, MD;  Location: [facility];  Service: General;  Laterality: N/A;  . ESOPHAGEAL MANOMETRY N/A 05/28/2015   Procedure: ESOPHAGEAL MANOMETRY (EM);  Surgeon: Irene Shipper, MD;  Location: [facility];  Service: Gastroenterology;  Laterality: N/A;  . FIBROID TUMOR     UTERUS SURGERY  . LAPAROSCOPIC HYSTERECTOMY       Social History:  Ambulatory  Independently     reports that she has never smoked. She has never used smokeless tobacco. She reports that she drinks alcohol. She reports that she does not use drugs.  Allergies:   Allergies  Allergen Reactions  . Pumpkin Flavor Anaphylaxis    Squash also  . Salmon [Fish Allergy] Anaphylaxis  . Sulfonamide Derivatives Other (See Comments)    REACTION: GI distress  . Morphine And Related Rash       Family History:   Family History  Problem Relation Age of Onset  . Irritable bowel syndrome Father   . Gallbladder disease Mother   . Gallbladder disease      3 mat. uncles  . Cancer Maternal Grandmother     ovarian  . Colon cancer Neg Hx     Medications: Prior to Admission medications   Medication Sig Start Date End Date Taking? Authorizing Provider  Ascorbic Acid (VITAMIN C) 500 MG tablet Take 500 mg by mouth daily.      Historical Provider, MD  aspirin 81 MG tablet Take 81 mg by mouth at bedtime.     Historical Provider, MD  atorvastatin (LIPITOR) 40 MG tablet Take 1 tablet (40 mg total) by mouth daily. 07/23/15   Evans Lance, MD  Calcium Carbonate (CALCIUM 500 PO) Take 2 tablets by mouth daily.      Historical Provider, MD  canagliflozin (INVOKANA) 100 MG TABS tablet Take 100 mg by mouth daily.    Historical Provider, MD  cetirizine (ZYRTEC) 10 MG tablet Take 10 mg by mouth at bedtime.     Historical Provider, MD  Cyanocobalamin (VITAMIN B 12) 250 MCG LOZG Take 2 tablets by mouth daily.     Historical Provider, MD  Insulin Infusion Pump  KIT as directed.     Historical Provider, MD  insulin lispro (HUMALOG) 100 UNIT/ML injection Inject 46.85 Units into the skin as directed. 1.6 units at 12 am and 6am 1.725 units ,12pm 2.3 units  3pm 2.0  Units  and 8 pm 2.5 units  total of 46.85 units daily-BASIL RATE IN INSULIN PUMP    Historical Provider, MD  Multiple Vitamin (MULTIVITAMIN) tablet Take 1 tablet by mouth daily.      Historical Provider, MD  nitroGLYCERIN (NITROSTAT) 0.4 MG SL tablet Place 1 tablet (0.4 mg total) under the tongue every 5 (five) minutes as needed for chest pain. 04/05/15   Ripudeep Krystal Eaton, MD  pantoprazole (PROTONIX) 40 MG tablet Take 40 mg by mouth 2 (two) times daily. Takes every morning, can take a second time if needed for acid    Historical Provider, MD  ramipril (ALTACE) 2.5  MG capsule Take 2.5 mg by mouth at bedtime.    Historical Provider, MD  sertraline (ZOLOFT) 100 MG tablet Take 100 mg by mouth daily before breakfast.    Historical Provider, MD  sucralfate (CARAFATE) 1 G tablet Take 1 g by mouth daily as needed (stomach upset). GAS    Historical Provider, MD  verapamil (CALAN-SR) 120 MG CR tablet Take 1 tablet (120 mg total) by mouth at bedtime. 07/23/15   Evans Lance, MD  ZETIA 10 MG tablet TAKE 1 TAB BY MOUTH ONCE DAILY AT BEDTIME 10/12/14   Historical Provider, MD    Physical Exam: Patient Vitals for the past 24 hrs:  BP Temp Temp src Pulse Resp SpO2 Weight  12/21/15 1936 - - - - - 100 % -  12/21/15 1930 120/70 - - 80 17 100 % -  12/21/15 1915 124/63 - - 79 19 100 % -  12/21/15 1900 125/74 - - 78 22 100 % -  12/21/15 1834 124/64 - - - - - -  12/21/15 1637 127/69 98.4 F (36.9 C) Oral 86 18 100 % -  12/21/15 1444 113/72 98.1 F (36.7 C) Oral 98 18 98 % 75 kg (165 lb 6.4 oz)    1. General:  in No Acute distress 2. Psychological: Alert and   Oriented 3. Head/ENT:   Moist    Mucous Membranes                          Head Non traumatic, neck supple                              Poor  Dentition 4. SKIN: normal Skin turgor,  Skin clean Dry and intact no rash 5. Heart: Regular rate and rhythm no  Murmur, Rub or gallop 6. Lungs:  Clear to auscultation bilaterally, no wheezes or crackles   7. Abdomen: Soft,  non-tender, Non distended 8. Lower extremities: no clubbing, cyanosis, or edema 9. Neurologically Grossly intact, moving all 4 extremities equally   10. MSK: Normal range of motion, is some degree of reproduction with palpation or movement of her chest pain   body mass index is 30.75 kg/m.  Labs on Admission:   Labs on Admission: I have personally reviewed following labs and imaging studies  CBC:  Recent Labs Lab 12/21/15 1450  WBC 6.9  NEUTROABS 5.2  HGB 13.6  HCT 41.4  MCV 89.2  PLT 676   Basic Metabolic Panel:  Recent Labs Lab 12/21/15 1450  NA 136  K 3.6  CL 104  CO2 25  GLUCOSE 240*  BUN 10  CREATININE 0.96  CALCIUM 9.1   GFR: Estimated Creatinine Clearance: 65 mL/min (by C-G formula based on SCr of 0.96 mg/dL). Liver Function Tests:  Recent Labs Lab 12/21/15 1450  AST 14*  ALT 13*  ALKPHOS 41  BILITOT 0.4  PROT 6.5  ALBUMIN 3.7   No results for input(s): LIPASE, AMYLASE in the last 168 hours. No results for input(s): AMMONIA in the last 168 hours. Coagulation Profile: No results for input(s): INR, PROTIME in the last 168 hours. Cardiac Enzymes: No results for input(s): CKTOTAL, CKMB, CKMBINDEX, TROPONINI in the last 168 hours. BNP (last 3 results) No results for input(s): PROBNP in the last 8760 hours. HbA1C: No results for input(s): HGBA1C in the last 72 hours. CBG:  Recent Labs Lab 12/21/15 1450 12/21/15 1953  GLUCAP  239* 152*   Lipid Profile: No results for input(s): CHOL, HDL, LDLCALC, TRIG, CHOLHDL, LDLDIRECT in the last 72 hours. Thyroid Function Tests: No results for input(s): TSH, T4TOTAL, FREET4, T3FREE, THYROIDAB in the last 72 hours. Anemia Panel: No results for input(s): VITAMINB12, FOLATE, FERRITIN,  TIBC, IRON, RETICCTPCT in the last 72 hours.   _0 (procalcitonin:4,lacticidven:4) )No results found for this or any previous visit (from the past 240 hour(s)).    UA  not ordered  No results found for: HGBA1C  Estimated Creatinine Clearance: 65 mL/min (by C-G formula based on SCr of 0.96 mg/dL).  BNP (last 3 results) No results for input(s): PROBNP in the last 8760 hours.   ECG REPORT  Independently reviewed Rate: 102  Rhythm: Sinus tachycardia ST&T Change: No acute ischemic changes  QTC 448  Filed Weights   12/21/15 1444  Weight: 75 kg (165 lb 6.4 oz)     Cultures:    Component Value Date/Time   SDES BLOOD RIGHT HAND 12/20/2009 0715   SPECREQUEST BOTTLES DRAWN AEROBIC AND ANAEROBIC 10CC EACH 12/20/2009 0715   CULT NO GROWTH 5 DAYS 12/20/2009 0715   REPTSTATUS 12/26/2009 FINAL 12/20/2009 0715     Radiological Exams on Admission: Dg Chest 2 View  Result Date: 12/21/2015 CLINICAL DATA:  Chest pain and cardiac palpitations for 1 week. Shortness of breath EXAM: CHEST  2 VIEW COMPARISON:  Chest radiograph April 04, 2015 and chest CT April 04, 2015 FINDINGS: There is no appreciable edema or consolidation. The heart size and pulmonary vascularity are normal. No adenopathy. No pneumothorax. No bone lesions evident. IMPRESSION: No edema or consolidation. Electronically Signed   By: Lowella Grip III M.D.   On: 12/21/2015 15:24    Chart has been reviewed    Assessment/Plan   50 y.o. female with medical history significant of diabetes, hyperlipidemia, prior pulmonary embolism in 2002 here With atypical chest pain   Present on Admission: . Chest pain - atypical patient has recurrent history of chest pain the past and had negative cardiac workup but has extensive risk factors including hypertension and diabetes - given risk factors will admit, monitor on telemetry, cycle cardiac enzymes, obtain serial ECG. Further risk stratify with lipid panel, hgA1C, obtain  TSH. Make sure patient is on Aspirin. Further treatment based on the currently pending results.   . Diabetes mellitus type 1 ([place]) - continue invokana and insulin drip Diabetes management coordinator consult . Essential hypertension, benign continue verapamil but hold ramipril given somewhat soft blood pressures . Hypercholesteremia - continue Zetia check lipid panel   Other plan as per orders.  DVT prophylaxis:    Lovenox     Code Status:  FULL CODE as per patient    Family Communication:   Family  at  Bedside  plan of care was discussed with father skipper [phone]  Disposition Plan:     To home once workup is complete and patient is stable                            Consults called: emailed cardiology    Admission status:    obs   Level of care     tele            I have spent a total of 56 min on this admission   Jocelyn [name] 12/21/2015, 10:14 PM    [facility]  Pager [phone]   after 2 AM please page floor coverage PA If 7AM-7PM,  please contact the day team taking care of the patient  [email or web address]  Password [email or web address]

## 2015-12-21 NOTE — Telephone Encounter (Signed)
New Message  Patient c/o Palpitations:  High priority if patient c/o lightheadedness and shortness of breath.  1. How long have you been having palpitations? A week  2. Are you currently experiencing lightheadedness and shortness of breath? No  3. Have you checked your BP and heart rate? (document readings) No  4. Are you experiencing any other symptoms? Pt voiced she still have the heart flutters in the middle of the night and it's waking the pt up.

## 2015-12-21 NOTE — ED Notes (Signed)
152 CBG

## 2015-12-22 ENCOUNTER — Encounter (HOSPITAL_COMMUNITY): Payer: Self-pay

## 2015-12-22 DIAGNOSIS — R072 Precordial pain: Secondary | ICD-10-CM | POA: Diagnosis not present

## 2015-12-22 DIAGNOSIS — R002 Palpitations: Secondary | ICD-10-CM | POA: Diagnosis present

## 2015-12-22 DIAGNOSIS — R079 Chest pain, unspecified: Secondary | ICD-10-CM

## 2015-12-22 DIAGNOSIS — R0781 Pleurodynia: Secondary | ICD-10-CM | POA: Diagnosis not present

## 2015-12-22 LAB — GLUCOSE, CAPILLARY
GLUCOSE-CAPILLARY: 172 mg/dL — AB (ref 65–99)
GLUCOSE-CAPILLARY: 152 mg/dL — AB (ref 65–99)
Glucose-Capillary: 180 mg/dL — ABNORMAL HIGH (ref 65–99)

## 2015-12-22 LAB — TROPONIN I

## 2015-12-22 LAB — MRSA PCR SCREENING: MRSA BY PCR: NEGATIVE

## 2015-12-22 MED ORDER — ENOXAPARIN SODIUM 40 MG/0.4ML ~~LOC~~ SOLN
40.0000 mg | SUBCUTANEOUS | Status: DC
  Administered 2015-12-22 – 2015-12-23 (×2): 40 mg via SUBCUTANEOUS
  Filled 2015-12-22 (×3): qty 0.4

## 2015-12-22 MED ORDER — INSULIN PUMP
SUBCUTANEOUS | Status: DC
  Administered 2015-12-22 – 2015-12-23 (×6): via SUBCUTANEOUS
  Filled 2015-12-22: qty 1

## 2015-12-22 MED ORDER — PANTOPRAZOLE SODIUM 40 MG PO TBEC
40.0000 mg | DELAYED_RELEASE_TABLET | Freq: Every day | ORAL | Status: DC
  Administered 2015-12-22 – 2015-12-23 (×2): 40 mg via ORAL
  Filled 2015-12-22 (×2): qty 1

## 2015-12-22 MED ORDER — ACETAMINOPHEN 325 MG PO TABS: 650.0000 mg | ORAL_TABLET | ORAL | Status: DC | PRN

## 2015-12-22 MED ORDER — ONDANSETRON HCL 4 MG/2ML IJ SOLN: 4.0000 mg | Freq: Four times a day (QID) | INTRAMUSCULAR | Status: DC | PRN

## 2015-12-22 MED ORDER — CANAGLIFLOZIN 100 MG PO TABS
100.0000 mg | ORAL_TABLET | Freq: Every day | ORAL | Status: DC
  Filled 2015-12-22 (×2): qty 1

## 2015-12-22 MED ORDER — ASPIRIN 81 MG PO CHEW
81.0000 mg | CHEWABLE_TABLET | Freq: Every day | ORAL | Status: DC
  Administered 2015-12-22: 81 mg via ORAL
  Filled 2015-12-22: qty 1

## 2015-12-22 MED ORDER — SERTRALINE HCL 100 MG PO TABS
100.0000 mg | ORAL_TABLET | Freq: Every day | ORAL | Status: DC
  Administered 2015-12-22 – 2015-12-23 (×2): 100 mg via ORAL
  Filled 2015-12-22: qty 1
  Filled 2015-12-22: qty 2

## 2015-12-22 MED ORDER — EZETIMIBE 10 MG PO TABS
10.0000 mg | ORAL_TABLET | Freq: Every day | ORAL | Status: DC
  Administered 2015-12-22 – 2015-12-23 (×2): 10 mg via ORAL
  Filled 2015-12-22 (×3): qty 1

## 2015-12-22 MED ORDER — PANTOPRAZOLE SODIUM 40 MG PO TBEC: 40.0000 mg | DELAYED_RELEASE_TABLET | ORAL | Status: DC

## 2015-12-22 MED ORDER — SUCRALFATE 1 G PO TABS: 1.0000 g | ORAL_TABLET | Freq: Every day | ORAL | Status: DC | PRN

## 2015-12-22 MED ORDER — VERAPAMIL HCL ER 120 MG PO TBCR
120.0000 mg | EXTENDED_RELEASE_TABLET | Freq: Every day | ORAL | Status: DC
  Administered 2015-12-22 (×2): 120 mg via ORAL
  Filled 2015-12-22 (×2): qty 1

## 2015-12-22 NOTE — Progress Notes (Addendum)
PROGRESS NOTE    Jocelyn [name]  [id number] DOB: Sep 01, 1965 DOA: 12/21/2015 PCP: Haywood Pao, MD Brief Narrative: Jocelyn [name] is a 50 y.o. female with medical history significant of diabetes, hyperlipidemia, prior pulmonary embolism in 2002  Presented with midsternal chest pain intermittently as well as palpitations for one week.  Assessment & Plan:  . Atypical chronic Chest pain - atypical patient has recurrent history of chest pain the past and had negative cardiac workup, including 2 negative cardiac caths, low risk myoview in 12/16 - normal Coronary CTA in March 2017 - Normal EGD in 2016 - numerous normal CT scans of chest - EKG and troponins negative, D dimer negative - no further cardiac workup needed at this time  H/o Palpitations -was changed from Toprol to Verapamil earlier this year -monitor on Tele -if stable DC home with monitor tomorrow  . Diabetes mellitus type 1 ([place])  - continue invokana and insulin pump, Diabetes management coordinator consulted  . Essential hypertension,  -benign continue verapamil but hold ramipril given somewhat soft blood pressures  . Hypercholesteremia - continue Zetia  DVT prophylaxis:    Lovenox     Code Status:  FULL CODE  Family Communication:    father Skipper at bedside Disposition Plan: Home tomorrow if stable    Consultants: Cards   Subjective: Still having chest pain  Objective: Vitals:   12/22/15 0845 12/22/15 0915 12/22/15 0930 12/22/15 1031  BP: 108/68 101/58 116/71 (!) 117/58  Pulse: 77 72 77 75  Resp: 20 13 15    Temp:    98.7 F (37.1 C)  TempSrc:    Oral  SpO2: 98% 98% 99% 99%  Weight:    75 kg (165 lb 6.4 oz)  Height:    5\' 1"  (1.549 m)   No intake or output data in the 24 hours ending 12/22/15 1115 Filed Weights   12/21/15 1444 12/22/15 1031  Weight: 75 kg (165 lb 6.4 oz) 75 kg (165 lb 6.4 oz)    Examination:  General exam: Appears calm and comfortable  Respiratory system: Clear to  auscultation. Respiratory effort normal. Cardiovascular system: S1 & S2 heard, RRR. No JVD, murmurs, rubs, gallops or clicks. No pedal edema. Gastrointestinal system: Abdomen is nondistended, soft and nontender. No organomegaly or masses felt. Normal bowel sounds heard. Central nervous system: Alert and oriented. No focal neurological deficits. Extremities: Symmetric 5 x 5 power. Skin: No rashes, lesions or ulcers Psychiatry: Judgement and insight appear normal. Mood & affect appropriate.     Data Reviewed: I have personally reviewed following labs and imaging studies  CBC:  Recent Labs Lab 12/21/15 1450  WBC 6.9  NEUTROABS 5.2  HGB 13.6  HCT 41.4  MCV 89.2  PLT 0000000   Basic Metabolic Panel:  Recent Labs Lab 12/21/15 1450  NA 136  K 3.6  CL 104  CO2 25  GLUCOSE 240*  BUN 10  CREATININE 0.96  CALCIUM 9.1   GFR: Estimated Creatinine Clearance: 64.2 mL/min (by C-G formula based on SCr of 0.96 mg/dL). Liver Function Tests:  Recent Labs Lab 12/21/15 1450  AST 14*  ALT 13*  ALKPHOS 41  BILITOT 0.4  PROT 6.5  ALBUMIN 3.7   No results for input(s): LIPASE, AMYLASE in the last 168 hours. No results for input(s): AMMONIA in the last 168 hours. Coagulation Profile: No results for input(s): INR, PROTIME in the last 168 hours. Cardiac Enzymes:  Recent Labs Lab 12/21/15 2156 12/22/15 0252 12/22/15 0908  TROPONINI <0.03 <  0.03 <0.03   BNP (last 3 results) No results for input(s): PROBNP in the last 8760 hours. HbA1C: No results for input(s): HGBA1C in the last 72 hours. CBG:  Recent Labs Lab 12/21/15 1450 12/21/15 1953 12/22/15 1050  GLUCAP 239* 152* 172*   Lipid Profile: No results for input(s): CHOL, HDL, LDLCALC, TRIG, CHOLHDL, LDLDIRECT in the last 72 hours. Thyroid Function Tests: No results for input(s): TSH, T4TOTAL, FREET4, T3FREE, THYROIDAB in the last 72 hours. Anemia Panel: No results for input(s): VITAMINB12, FOLATE, FERRITIN, TIBC, IRON,  RETICCTPCT in the last 72 hours. Urine analysis:    Component Value Date/Time   COLORURINE STRAW (A) 12/20/2009 0655   APPEARANCEUR CLEAR 12/20/2009 0655   LABSPEC 1.015 12/20/2009 0655   PHURINE 7.5 12/20/2009 0655   GLUCOSEU NEGATIVE 12/20/2009 0655   HGBUR NEGATIVE 12/20/2009 0655   BILIRUBINUR NEGATIVE 12/20/2009 0655   KETONESUR NEGATIVE 12/20/2009 0655   PROTEINUR NEGATIVE 12/20/2009 0655   UROBILINOGEN 0.2 12/20/2009 0655   NITRITE NEGATIVE 12/20/2009 0655   LEUKOCYTESUR NEGATIVE 12/20/2009 0655   Sepsis Labs: @LABRCNTIP (procalcitonin:4,lacticidven:4)  )No results found for this or any previous visit (from the past 240 hour(s)).       Radiology Studies: Dg Chest 2 View  Result Date: 12/21/2015 CLINICAL DATA:  Chest pain and cardiac palpitations for 1 week. Shortness of breath EXAM: CHEST  2 VIEW COMPARISON:  Chest radiograph April 04, 2015 and chest CT April 04, 2015 FINDINGS: There is no appreciable edema or consolidation. The heart size and pulmonary vascularity are normal. No adenopathy. No pneumothorax. No bone lesions evident. IMPRESSION: No edema or consolidation. Electronically Signed   By: Lowella Grip III M.D.   On: 12/21/2015 15:24        Scheduled Meds: . aspirin  81 mg Oral QHS  . canagliflozin  100 mg Oral Daily  . enoxaparin (LOVENOX) injection  40 mg Subcutaneous Q24H  . ezetimibe  10 mg Oral Daily  . fentaNYL (SUBLIMAZE) injection  50 mcg Intramuscular Once  . insulin pump   Subcutaneous Q4H  . pantoprazole  40 mg Oral Daily  . sertraline  100 mg Oral QAC breakfast  . verapamil  120 mg Oral QHS   Continuous Infusions:    LOS: 0 days    Time spent: 34min    Domenic Polite, MD [facility] Pager [phone]  If 7PM-7AM, please contact night-coverage [email or web address] Password [email or web address] 12/22/2015, 11:15 AM

## 2015-12-22 NOTE — Consult Note (Signed)
Reason for Consult:   Chest pain  Requesting Physician: [facility] Primary Cardiologist Dr Lovena Le  HPI:   50 y.o. female with a history of type 1 diabetes on insulin pump, dyslipidemia, history of pulmonary embolism (2002), GERD, anxiety, and chronic chest pain who came to [facility] ED 12/21/15 for evaluation of chest pain. Pt describes mid sternal pain that radiates to the lt side of her chest. It awakened her at 3 am and was associated with palpitations. She took one NTG with some relief and came to the ED. Her EKG is normal, Troponin negative x 3, D-dimer normal.    She has had extensive coronary evaluation including catheterization on 2 different occasions demonstrating no obstructive coronary disease. Her last cath was 2005. Last Myoview Dec 2016 was normal. Echo in Dec 2016 showed her EF to be 55-60% with grade 1 DD. She had a coronary CTA March 2017 that was normal.   There has been some concern she may be having microvascular angina. Dr Lovena Le changed her Toprol to Verapamil in April 2017.    PMHx:  Past Medical History:  Diagnosis Date  . Anxiety   . Chest pain    RECURRENT WITH NEGITIVE CATHERIZATIONS  . Complication of anesthesia    aspirated and "gained 15pounds, ended up in ICU"  . DM (diabetes mellitus) ([place])   . Dyslipidemia   . Fatty liver   . GERD (gastroesophageal reflux disease)   . History of CT scan    Coronary CTA 3/17: Calcium score 0; no plaque or stenosis noted in coronary arteries  . Hypercholesteremia   . Hypertension   . Obesity   . Pulmonary embolus ([place]) 2002  . Tachycardia     Past Surgical History:  Procedure Laterality Date  . CARDIAC CATHETERIZATION     x2  . CARPAL TUNNEL RELEASE     right  . CHOLECYSTECTOMY  03/24/2012   Procedure: LAPAROSCOPIC CHOLECYSTECTOMY WITH INTRAOPERATIVE CHOLANGIOGRAM;  Surgeon: Edward Jolly, MD;  Location: [facility] ORS;  Service: General;  Laterality: N/A;  . ESOPHAGEAL MANOMETRY N/A  05/28/2015   Procedure: ESOPHAGEAL MANOMETRY (EM);  Surgeon: Irene Shipper, MD;  Location: [facility] ENDOSCOPY;  Service: Gastroenterology;  Laterality: N/A;  . FIBROID TUMOR     UTERUS SURGERY  . LAPAROSCOPIC HYSTERECTOMY      SOCHx:  reports that she has never smoked. She has never used smokeless tobacco. She reports that she drinks alcohol. She reports that she does not use drugs.  FAMHx: Family History  Problem Relation Age of Onset  . Irritable bowel syndrome Father   . Gallbladder disease Mother   . Gallbladder disease      3 mat. uncles  . Cancer Maternal Grandmother     ovarian  . Colon cancer Neg Hx     ALLERGIES: Allergies  Allergen Reactions  . Pumpkin Flavor Anaphylaxis    Squash also  . Salmon [Fish Allergy] Anaphylaxis  . Crestor [Rosuvastatin Calcium] Other (See Comments)    Muscle pain  . Lipitor [Atorvastatin] Other (See Comments)    Muscle pain  . Sulfonamide Derivatives Other (See Comments)    REACTION: GI distress  . Morphine And Related Rash    ROS: Review of Systems: General: negative for chills, fever, night sweats or weight changes.  Cardiovascular: negative for dyspnea on exertion, edema, orthopnea, paroxysmal nocturnal dyspnea or shortness of breath HEENT: negative for any visual disturbances, blindness, glaucoma Dermatological: negative for rash Respiratory:  negative for cough, hemoptysis, or wheezing Urologic: negative for hematuria or dysuria Abdominal: negative for nausea, vomiting, diarrhea, bright red blood per rectum, melena, or hematemesis Neurologic: negative for visual changes, syncope, or dizziness Musculoskeletal: negative for back pain, joint pain, or swelling Psych: cooperative and appropriate All other systems reviewed and are otherwise negative except as noted above.   HOME MEDICATIONS: Prior to Admission medications   Medication Sig Start Date End Date Taking? Authorizing Provider  Ascorbic Acid (VITAMIN C) 500 MG tablet Take  500 mg by mouth daily.     Yes Historical Provider, MD  aspirin 81 MG tablet Take 81 mg by mouth at bedtime.    Yes Historical Provider, MD  Calcium Carbonate (CALCIUM 500 PO) Take 1,000 mg by mouth daily.    Yes Historical Provider, MD  canagliflozin (INVOKANA) 100 MG TABS tablet Take 100 mg by mouth daily.   Yes Historical Provider, MD  cetirizine (ZYRTEC) 10 MG tablet Take 10 mg by mouth at bedtime.    Yes Historical Provider, MD  Coenzyme Q10 (CO Q 10) 100 MG CAPS Take 100 mg by mouth daily.    Yes Historical Provider, MD  Cyanocobalamin (VITAMIN B 12) 250 MCG LOZG Take 2 tablets by mouth daily.    Yes Historical Provider, MD  DUREZOL 0.05 % EMUL Place 1 drop into the left eye 2 (two) times daily. 11/06/15  Yes Historical Provider, MD  Insulin Infusion Pump KIT as directed.    Yes Historical Provider, MD  insulin lispro (HUMALOG) 100 UNIT/ML injection Inject 41.4 Units into the skin as directed. 1.7 units/h at 12 am  1.4 units/h  3 AM,  2.0  Units/h at 12pm,  1.7 units/h at  3pm,  2.3  Units/h  at 8 pm,    total of 41.4 units daily-BASIL RATE IN INSULIN PUMP   Yes Historical Provider, MD  MAGNESIUM PO Take 1 tablet by mouth daily.    Yes Historical Provider, MD  Multiple Vitamin (MULTIVITAMIN) tablet Take 1 tablet by mouth daily.     Yes Historical Provider, MD  nitroGLYCERIN (NITROSTAT) 0.4 MG SL tablet Place 1 tablet (0.4 mg total) under the tongue every 5 (five) minutes as needed for chest pain. 04/05/15  Yes Ripudeep Krystal Eaton, MD  pantoprazole (PROTONIX) 40 MG tablet Take 40 mg by mouth See admin instructions. Takes 40 mg every morning, can take a second 40 mg if needed for acid   Yes Historical Provider, MD  Pitavastatin Calcium (LIVALO) 2 MG TABS Take 2 mg by mouth at bedtime.    Yes Historical Provider, MD  ramipril (ALTACE) 2.5 MG capsule Take 2.5 mg by mouth at bedtime.   Yes Historical Provider, MD  sertraline (ZOLOFT) 100 MG tablet Take 100 mg by mouth daily before breakfast.   Yes  Historical Provider, MD  sucralfate (CARAFATE) 1 G tablet Take 1 g by mouth daily as needed (stomach upset). GAS   Yes Historical Provider, MD  verapamil (CALAN-SR) 120 MG CR tablet Take 1 tablet (120 mg total) by mouth at bedtime. 07/23/15  Yes Evans Lance, MD  ZETIA 10 MG tablet TAKE 1 TAB (10 mg) BY MOUTH ONCE DAILY AT BEDTIME 10/12/14  Yes Historical Provider, MD    HOSPITAL MEDICATIONS: I have reviewed the patient's current medications.  VITALS: Blood pressure 100/62, pulse 84, temperature 98.4 F (36.9 C), temperature source Oral, resp. rate 18, weight 165 lb 6.4 oz (75 kg), SpO2 97 %.  PHYSICAL EXAM: General appearance: alert, cooperative,  no distress and moderately obese Neck: no carotid bruit and no JVD Lungs: clear to auscultation bilaterally Heart: regular rate and rhythm Abdomen: obese, non tender Extremities: extremities normal, atraumatic, no cyanosis or edema Pulses: 2+ and symmetric Skin: Skin color, texture, turgor normal. No rashes or lesions Neurologic: Grossly normal  LABS: Results for orders placed or performed during the hospital encounter of 12/21/15 (from the past 24 hour(s))  CBC with Differential     Status: None   Collection Time: 12/21/15  2:50 PM  Result Value Ref Range   WBC 6.9 4.0 - 10.5 K/uL   RBC 4.64 3.87 - 5.11 MIL/uL   Hemoglobin 13.6 12.0 - 15.0 g/dL   HCT 41.4 36.0 - 46.0 %   MCV 89.2 78.0 - 100.0 fL   MCH 29.3 26.0 - 34.0 pg   MCHC 32.9 30.0 - 36.0 g/dL   RDW 12.8 11.5 - 15.5 %   Platelets 254 150 - 400 K/uL   Neutrophils Relative % 75 %   Neutro Abs 5.2 1.7 - 7.7 K/uL   Lymphocytes Relative 18 %   Lymphs Abs 1.2 0.7 - 4.0 K/uL   Monocytes Relative 6 %   Monocytes Absolute 0.4 0.1 - 1.0 K/uL   Eosinophils Relative 1 %   Eosinophils Absolute 0.1 0.0 - 0.7 K/uL   Basophils Relative 0 %   Basophils Absolute 0.0 0.0 - 0.1 K/uL  Comprehensive metabolic panel     Status: Abnormal   Collection Time: 12/21/15  2:50 PM  Result Value  Ref Range   Sodium 136 135 - 145 mmol/L   Potassium 3.6 3.5 - 5.1 mmol/L   Chloride 104 101 - 111 mmol/L   CO2 25 22 - 32 mmol/L   Glucose, Bld 240 (H) 65 - 99 mg/dL   BUN 10 6 - 20 mg/dL   Creatinine, Ser 0.96 0.44 - 1.00 mg/dL   Calcium 9.1 8.9 - 10.3 mg/dL   Total Protein 6.5 6.5 - 8.1 g/dL   Albumin 3.7 3.5 - 5.0 g/dL   AST 14 (L) 15 - 41 U/L   ALT 13 (L) 14 - 54 U/L   Alkaline Phosphatase 41 38 - 126 U/L   Total Bilirubin 0.4 0.3 - 1.2 mg/dL   GFR calc non Af Amer >60 >60 mL/min   GFR calc Af Amer >60 >60 mL/min   Anion gap 7 5 - 15  POC CBG, ED     Status: Abnormal   Collection Time: 12/21/15  2:50 PM  Result Value Ref Range   Glucose-Capillary 239 (H) 65 - 99 mg/dL  I-Stat Troponin, ED - 0, 3, 6 hours (not at [facility])     Status: None   Collection Time: 12/21/15  3:03 PM  Result Value Ref Range   Troponin i, poc 0.00 0.00 - 0.08 ng/mL   Comment 3          CBG monitoring, ED     Status: Abnormal   Collection Time: 12/21/15  7:53 PM  Result Value Ref Range   Glucose-Capillary 152 (H) 65 - 99 mg/dL  I-Stat Troponin, ED - 0, 3, 6 hours (not at [facility])     Status: None   Collection Time: 12/21/15  8:37 PM  Result Value Ref Range   Troponin i, poc 0.01 0.00 - 0.08 ng/mL   Comment 3          Troponin I (q 6hr x 3)     Status: None   Collection Time: 12/21/15  9:56 PM  Result Value Ref Range   Troponin I <0.03 <0.03 ng/mL  D-dimer, quantitative (not at [facility])     Status: None   Collection Time: 12/21/15  9:56 PM  Result Value Ref Range   D-Dimer, Quant <0.27 0.00 - 0.50 ug/mL-FEU  Troponin I (q 6hr x 3)     Status: None   Collection Time: 12/22/15  2:52 AM  Result Value Ref Range   Troponin I <0.03 <0.03 ng/mL    EKG: NSR  IMAGING: Dg Chest 2 View  Result Date: 12/21/2015 CLINICAL DATA:  Chest pain and cardiac palpitations for 1 week. Shortness of breath EXAM: CHEST  2 VIEW COMPARISON:  Chest radiograph April 04, 2015 and chest CT April 04, 2015 FINDINGS: There is  no appreciable edema or consolidation. The heart size and pulmonary vascularity are normal. No adenopathy. No pneumothorax. No bone lesions evident. IMPRESSION: No edema or consolidation. Electronically Signed   By: Lowella Grip III M.D.   On: 12/21/2015 15:24    IMPRESSION: Principal Problem:   Chest pain Active Problems:   Palpitations   Essential hypertension, benign   Diabetes mellitus type 1 (HCC)   Hypercholesteremia   Insulin pump in place   RECOMMENDATION: Dr Lovena Le to see-   Time Spent Directly with Patient: [address] minutes  [place], [place] beeper 12/22/2015, 8:18 AM   Cardiology Attending  Patient seen and examined. She is well known to me from the office visits in the past. She is a very pleasant 50 year old woman with long-standing diabetes and recurrent chest pain associated with palpitations. She does not have obstructive coronary disease. The patient was in her usual state of health when she experienced recurrent substernal chest pain and palpitations. The initial spell occurred approximately 8 days prior to admission, and the most recent episode occurred approximately 48 hours ago. She was awakened from sleep and had substernal chest tightness which was relieved with sublingual nitroglycerin. She has been admitted and observed. She has normal troponins and a normal ECG. Telemetry is demonstrated sinus rhythm. She denies medical noncompliance. Her exam is notable for a regular rate and rhythm and clear lungs and no edema. Neurologically she is normal. EKG demonstrates sinus rhythm. There are no ST-T wave changes associated with ischemia. Assessment / plan 1. Chest pain with typical as well as atypical features for coronary disease - at this point, if there is no objective evidence for ischemia, I would not recommend additional diagnostic evaluation. 2. Palpitations - will monitor the patient on telemetry. If she has SVT then additional recommendations for  treatment will be made. I suspect she has sinus tachycardia. If no additional arrhythmias seen as an inpatient, then we would have her wear heart monitor as an outpatient. 3. Obesity - I encouraged the patient to lose weight in the past. She has been unable to do so.  Cristopher Peru, M.D.

## 2015-12-22 NOTE — Progress Notes (Signed)
Inpatient Diabetes Program Recommendations  AACE/ADA: New Consensus Statement on Inpatient Glycemic Control (2015)  Target Ranges:  Prepandial:   less than 140 mg/dL      Peak postprandial:   less than 180 mg/dL (1-2 hours)      Critically ill patients:  140 - 180 mg/dL   Lab Results  Component Value Date   GLUCAP 172 (H) 12/22/2015    Review of Glycemic ControlResults for [name], [name] (MRN [id number]) as of 12/22/2015 15:44  Ref. Range 12/21/2015 14:50 12/21/2015 19:53 12/22/2015 10:50  Glucose-Capillary Latest Ref Range: 65 - 99 mg/dL 239 (H) 152 (H) 172 (H)    Diabetes history: Type 1 diabetes Outpatient Diabetes medications: Invokana 100 mg daily  Per medication reconciliaiton: Total basal: 46.85 12a- 1.6 units/hr 6am-1.725 units/hr 12p-2.3 units/hr 3p-2.0 units per hour Bolus doses not listed Current orders for Inpatient glycemic control:  Insulin pump, Invokana 100 mg daily  Inpatient Diabetes Program Recommendations:    Spoke with patient by phone.  She has insulin pump supplies and is due to change her site out today.  She is alert and oriented and able to manage pump.  No recommendations at this time.  Thanks, Adah Perl, RN, BC-ADM Inpatient Diabetes Coordinator Pager [phone] (8a-5p)

## 2015-12-22 NOTE — ED Notes (Signed)
Patient checked her CBG on her machine CBG 95 - requested apple juice.

## 2015-12-23 DIAGNOSIS — R0781 Pleurodynia: Secondary | ICD-10-CM

## 2015-12-23 DIAGNOSIS — R079 Chest pain, unspecified: Secondary | ICD-10-CM | POA: Diagnosis not present

## 2015-12-23 LAB — BASIC METABOLIC PANEL
ANION GAP: 5 (ref 5–15)
BUN: 15 mg/dL (ref 6–20)
CALCIUM: 8.9 mg/dL (ref 8.9–10.3)
CO2: 24 mmol/L (ref 22–32)
CREATININE: 0.94 mg/dL (ref 0.44–1.00)
Chloride: 104 mmol/L (ref 101–111)
GLUCOSE: 171 mg/dL — AB (ref 65–99)
Potassium: 4 mmol/L (ref 3.5–5.1)
Sodium: 133 mmol/L — ABNORMAL LOW (ref 135–145)

## 2015-12-23 LAB — CBC
HCT: 39.6 % (ref 36.0–46.0)
HEMOGLOBIN: 13 g/dL (ref 12.0–15.0)
MCH: 29.3 pg (ref 26.0–34.0)
MCHC: 32.8 g/dL (ref 30.0–36.0)
MCV: 89.4 fL (ref 78.0–100.0)
PLATELETS: 235 10*3/uL (ref 150–400)
RBC: 4.43 MIL/uL (ref 3.87–5.11)
RDW: 12.8 % (ref 11.5–15.5)
WBC: 6.3 10*3/uL (ref 4.0–10.5)

## 2015-12-23 LAB — GLUCOSE, CAPILLARY
GLUCOSE-CAPILLARY: 151 mg/dL — AB (ref 65–99)
Glucose-Capillary: 131 mg/dL — ABNORMAL HIGH (ref 65–99)
Glucose-Capillary: 181 mg/dL — ABNORMAL HIGH (ref 65–99)

## 2015-12-23 MED ORDER — KETOROLAC TROMETHAMINE 30 MG/ML IJ SOLN
30.0000 mg | Freq: Once | INTRAMUSCULAR | Status: AC
  Administered 2015-12-23: 30 mg via INTRAVENOUS
  Filled 2015-12-23: qty 1

## 2015-12-23 NOTE — Progress Notes (Signed)
Discharged home with family, self care. 

## 2015-12-23 NOTE — Progress Notes (Signed)
Patient ID: ROREE AKEMON, female   DOB: 12-11-1965, 50 y.o.   MRN: [id number]    Patient Name: Jocelyn [name] Date of Encounter: 12/23/2015     Principal Problem:   Pain in the chest Active Problems:   Hypercholesteremia   Essential hypertension, benign   Diabetes mellitus type 1 (HCC)   Insulin pump in place   Palpitations    SUBJECTIVE  Minimal residual chest pain. She notes that there is a pleuritic component.  CURRENT MEDS . aspirin  81 mg Oral QHS  . canagliflozin  100 mg Oral Daily  . enoxaparin (LOVENOX) injection  40 mg Subcutaneous Q24H  . ezetimibe  10 mg Oral Daily  . fentaNYL (SUBLIMAZE) injection  50 mcg Intramuscular Once  . insulin pump   Subcutaneous Q4H  . pantoprazole  40 mg Oral Daily  . sertraline  100 mg Oral QAC breakfast  . verapamil  120 mg Oral QHS    OBJECTIVE  Vitals:   12/23/15 0411 12/23/15 0700 12/23/15 0800 12/23/15 1119  BP: 107/67 (!) 99/50 118/71 119/65  Pulse: 73 85 76 81  Resp: 16 19 19  (!) 22  Temp: 98.1 F (36.7 C) 98.4 F (36.9 C)  97.9 F (36.6 C)  TempSrc: Oral Oral  Oral  SpO2: 99% 96% 99% 99%  Weight:      Height:       No intake or output data in the 24 hours ending 12/23/15 1253 Filed Weights   12/21/15 1444 12/22/15 1031  Weight: 165 lb 6.4 oz (75 kg) 165 lb 6.4 oz (75 kg)    PHYSICAL EXAM  General: Pleasant, NAD. Neuro: Alert and oriented X 3. Moves all extremities spontaneously. Psych: Normal affect. HEENT:  Normal  Neck: Supple without bruits or JVD. Lungs:  Resp regular and unlabored, CTA. Heart: RRR no s3, s4, or murmurs. Abdomen: Soft, non-tender, non-distended, BS + x 4.  Extremities: No clubbing, cyanosis or edema. DP/PT/Radials 2+ and equal bilaterally.  Accessory Clinical Findings  CBC  Recent Labs  12/21/15 1450 12/23/15 0507  WBC 6.9 6.3  NEUTROABS 5.2  --   HGB 13.6 13.0  HCT 41.4 39.6  MCV 89.2 89.4  PLT 254 [id number]   Basic Metabolic Panel  Recent Labs  12/21/15 1450  12/22/15 2337  NA 136 133*  K 3.6 4.0  CL 104 104  CO2 25 24  GLUCOSE 240* 171*  BUN 10 15  CREATININE 0.96 0.94  CALCIUM 9.1 8.9   Liver Function Tests  Recent Labs  12/21/15 1450  AST 14*  ALT 13*  ALKPHOS 41  BILITOT 0.4  PROT 6.5  ALBUMIN 3.7   No results for input(s): LIPASE, AMYLASE in the last 72 hours. Cardiac Enzymes  Recent Labs  12/21/15 2156 12/22/15 0252 12/22/15 0908  TROPONINI <0.03 <0.03 <0.03   BNP Invalid input(s): POCBNP D-Dimer  Recent Labs  12/21/15 2156  DDIMER <0.27   Hemoglobin A1C No results for input(s): HGBA1C in the last 72 hours. Fasting Lipid Panel No results for input(s): CHOL, HDL, LDLCALC, TRIG, CHOLHDL, LDLDIRECT in the last 72 hours. Thyroid Function Tests No results for input(s): TSH, T4TOTAL, T3FREE, THYROIDAB in the last 72 hours.  Invalid input(s): FREET3  TELE  NSR. I have reviewed all of her strips. She has lots of noise which is labeled as PVC's or NSVT.   Radiology/Studies  Dg Chest 2 View  Result Date: 12/21/2015 CLINICAL DATA:  Chest pain and cardiac palpitations for 1 week. Shortness of  breath EXAM: CHEST  2 VIEW COMPARISON:  Chest radiograph April 04, 2015 and chest CT April 04, 2015 FINDINGS: There is no appreciable edema or consolidation. The heart size and pulmonary vascularity are normal. No adenopathy. No pneumothorax. No bone lesions evident. IMPRESSION: No edema or consolidation. Electronically Signed   By: Lowella Grip III M.D.   On: 12/21/2015 15:24    ASSESSMENT AND PLAN  1. Non-cardiac chest pain - her symptoms are vague and difficult to deciepher but now sound more pleuritic. No back pain. She notes that she had shingles last month involving her chest and arms.  I will give toradol. She can go home. No additional cardiac workup. I considered uptitration of her verapamil but will hold off on this for now.  Ryland Smoots,M.D.  12/23/2015 12:53 PM

## 2015-12-24 NOTE — Telephone Encounter (Signed)
Dr Lovena Le saw in the ER:  Assessment / plan 1. Chest pain with typical as well as atypical features for coronary disease - at this point, if there is no objective evidence for ischemia, I would not recommend additional diagnostic evaluation. 2. Palpitations - will monitor the patient on telemetry. If she has SVT then additional recommendations for treatment will be made. I suspect she has sinus tachycardia. If no additional arrhythmias seen as an inpatient, then we would have her wear heart monitor as an outpatient. 3. Obesity - I encouraged the patient to lose weight in the past. She has been unable to do so.  Cristopher Peru, M.D

## 2015-12-26 ENCOUNTER — Encounter: Payer: Self-pay | Admitting: *Deleted

## 2015-12-29 NOTE — Discharge Summary (Addendum)
Physician Discharge Summary  Jocelyn [name]:[id number] DOB: 21-Apr-1965 DOA: 12/21/2015  PCP: Haywood Pao, MD  Admit date: 12/21/2015 Discharge date: 12/23/2015  Time spent: 35 minutes  Recommendations for Outpatient Follow-up:  1. Dr.TAylor in 2weeks, please consider Event monitor if she continues to have palpitations   Discharge Diagnoses:  Principal Problem:   Pain in the chest Active Problems:   Hypercholesteremia   Essential hypertension, benign   Diabetes mellitus type 1 ([place])   Insulin pump in place   Palpitations   Discharge Condition: stable  Diet recommendation: Diabetic  Filed Weights   12/21/15 1444 12/22/15 1031  Weight: 75 kg (165 lb 6.4 oz) 75 kg (165 lb 6.4 oz)    History of present illness:  Jocelyn [name] a 50 y.o.femalewith medical history significant of diabetes, hyperlipidemia, prior pulmonary embolism in 2002  Presented with midsternal chest pain intermittently as well as palpitations for one week.  Hospital Course:  .Atypical chronic Chest pain - atypical patient has recurrent history of chest pain the past and had negative cardiac workup, including 2 negative cardiac caths, low risk myoview in 12/16 - normal Coronary CTA in March 2017 - Normal EGD in 2016 - numerous normal CT scans of chest - EKG and troponins negative, D dimer negative - no further cardiac workup needed at this time, seen by Cardiology Dr.Taylor this admission  H/o Palpitations -was changed from Toprol to Verapamil earlier this year -no events  on Tele -FU with Dr.Taylor and consider Event monitor if she continues to have palpitations  .Diabetes mellitus type 1 ([place])  - continue invokanaand insulin pump  .Essential hypertension,  -benign continue verapamil   .Hypercholesteremia - continue Zetia  Consultations:  Cardiology Dr.Taylor  Discharge Exam: Vitals:   12/23/15 0800 12/23/15 1119  BP: 118/71 119/65  Pulse: 76 81  Resp: 19 (!) 22   Temp:  97.9 F (36.6 C)    General: AAOx3 Cardiovascular: S1S2/RRR Respiratory: CTAB  Discharge Instructions   Discharge Instructions    Diet - low sodium heart healthy    Complete by:  As directed    Diet Carb Modified    Complete by:  As directed    Increase activity slowly    Complete by:  As directed      Discharge Medication List as of 12/23/2015  2:37 PM    CONTINUE these medications which have NOT CHANGED   Details  Ascorbic Acid (VITAMIN C) 500 MG tablet Take 500 mg by mouth daily.  , Until Discontinued, Historical Med    aspirin 81 MG tablet Take 81 mg by mouth at bedtime. , Until Discontinued, Historical Med    Calcium Carbonate (CALCIUM 500 PO) Take 1,000 mg by mouth daily. , Historical Med    canagliflozin (INVOKANA) 100 MG TABS tablet Take 100 mg by mouth daily., Until Discontinued, Historical Med    cetirizine (ZYRTEC) 10 MG tablet Take 10 mg by mouth at bedtime. , Until Discontinued, Historical Med    Coenzyme Q10 (CO Q 10) 100 MG CAPS Take 100 mg by mouth daily. , Historical Med    Cyanocobalamin (VITAMIN B 12) 250 MCG LOZG Take 2 tablets by mouth daily. , Until Discontinued, Historical Med    DUREZOL 0.05 % EMUL Place 1 drop into the left eye 2 (two) times daily., Starting Tue 11/06/2015, Historical Med    Insulin Infusion Pump KIT as directed. , Until Discontinued, Historical Med    insulin lispro (HUMALOG) 100 UNIT/ML injection Inject 41.4 Units  into the skin as directed. 1.7 units/h at 12 am  1.4 units/h  3 AM,  2.0  Units/h at 12pm,  1.7 units/h at  3pm,  2.3  Units/h  at 8 pm,    total of 41.4 units daily-BASIL RATE IN INSULIN PUMP, Historical Med    MAGNESIUM PO Take 1 tablet by mouth daily. , Historical Med    Multiple Vitamin (MULTIVITAMIN) tablet Take 1 tablet by mouth daily.  , Until Discontinued, Historical Med    nitroGLYCERIN (NITROSTAT) 0.4 MG SL tablet Place 1 tablet (0.4 mg total) under the tongue every 5 (five) minutes as needed for  chest pain., Starting Thu 04/05/2015, Print    pantoprazole (PROTONIX) 40 MG tablet Take 40 mg by mouth See admin instructions. Takes 40 mg every morning, can take a second 40 mg if needed for acid, Historical Med    Pitavastatin Calcium (LIVALO) 2 MG TABS Take 2 mg by mouth at bedtime. , Historical Med    ramipril (ALTACE) 2.5 MG capsule Take 2.5 mg by mouth at bedtime., Until Discontinued, Historical Med    sertraline (ZOLOFT) 100 MG tablet Take 100 mg by mouth daily before breakfast., Until Discontinued, Historical Med    sucralfate (CARAFATE) 1 G tablet Take 1 g by mouth daily as needed (stomach upset). GAS, Until Discontinued, Historical Med    verapamil (CALAN-SR) 120 MG CR tablet Take 1 tablet (120 mg total) by mouth at bedtime., Starting Mon 07/23/2015, Normal    ZETIA 10 MG tablet TAKE 1 TAB (10 mg) BY MOUTH ONCE DAILY AT BEDTIME, Historical Med       Allergies  Allergen Reactions  . Pumpkin Flavor Anaphylaxis    Squash also  . Salmon [Fish Allergy] Anaphylaxis  . Crestor [Rosuvastatin Calcium] Other (See Comments)    Muscle pain  . Lipitor [Atorvastatin] Other (See Comments)    Muscle pain  . Sulfonamide Derivatives Other (See Comments)    REACTION: GI distress  . Morphine And Related Rash   Follow-up Information    Haywood Pao, MD. Schedule an appointment as soon as possible for a visit in 1 week(s).   Specialty:  Internal Medicine Contact information: [place] [address] [phone]        Cristopher Peru, MD. Schedule an appointment as soon as possible for a visit in 1 month(s).   Specialty:  Cardiology Contact information: [address] [place] [address] [phone]            The results of significant diagnostics from this hospitalization (including imaging, microbiology, ancillary and laboratory) are listed below for reference.    Significant Diagnostic Studies: Dg Chest 2 View  Result Date:  12/21/2015 CLINICAL DATA:  Chest pain and cardiac palpitations for 1 week. Shortness of breath EXAM: CHEST  2 VIEW COMPARISON:  Chest radiograph April 04, 2015 and chest CT April 04, 2015 FINDINGS: There is no appreciable edema or consolidation. The heart size and pulmonary vascularity are normal. No adenopathy. No pneumothorax. No bone lesions evident. IMPRESSION: No edema or consolidation. Electronically Signed   By: Lowella Grip III M.D.   On: 12/21/2015 15:24    Microbiology: Recent Results (from the past 240 hour(s))  MRSA PCR Screening     Status: None   Collection Time: 12/22/15 10:19 AM  Result Value Ref Range Status   MRSA by PCR NEGATIVE NEGATIVE Final    Comment:        The GeneXpert MRSA Assay (FDA  approved for NASAL specimens only), is one component of a comprehensive MRSA colonization surveillance program. It is not intended to diagnose MRSA infection nor to guide or monitor treatment for MRSA infections.      Labs: Basic Metabolic Panel:  Recent Labs Lab 12/22/15 2337  NA 133*  K 4.0  CL 104  CO2 24  GLUCOSE 171*  BUN 15  CREATININE 0.94  CALCIUM 8.9   Liver Function Tests: No results for input(s): AST, ALT, ALKPHOS, BILITOT, PROT, ALBUMIN in the last 168 hours. No results for input(s): LIPASE, AMYLASE in the last 168 hours. No results for input(s): AMMONIA in the last 168 hours. CBC:  Recent Labs Lab 12/23/15 0507  WBC 6.3  HGB 13.0  HCT 39.6  MCV 89.4  PLT 235   Cardiac Enzymes: No results for input(s): CKTOTAL, CKMB, CKMBINDEX, TROPONINI in the last 168 hours. BNP: BNP (last 3 results) No results for input(s): BNP in the last 8760 hours.  ProBNP (last 3 results) No results for input(s): PROBNP in the last 8760 hours.  CBG:  Recent Labs Lab 12/22/15 1946 12/22/15 2320 12/23/15 0402 12/23/15 0805 12/23/15 1214  GLUCAP 180* 152* 151* 131* 181*       Signed:  Gessica Jawad MD.  [facility] 12/29/2015, 3:22  PM

## 2016-01-09 ENCOUNTER — Encounter: Payer: Self-pay | Admitting: Internal Medicine

## 2016-01-09 ENCOUNTER — Ambulatory Visit (INDEPENDENT_AMBULATORY_CARE_PROVIDER_SITE_OTHER): Payer: BC Managed Care – PPO | Admitting: Internal Medicine

## 2016-01-09 VITALS — BP 112/68 | HR 100 | Ht 61.0 in | Wt 164.6 lb

## 2016-01-09 DIAGNOSIS — I10 Essential (primary) hypertension: Secondary | ICD-10-CM

## 2016-01-09 DIAGNOSIS — R079 Chest pain, unspecified: Secondary | ICD-10-CM | POA: Diagnosis not present

## 2016-01-09 DIAGNOSIS — Z23 Encounter for immunization: Secondary | ICD-10-CM

## 2016-01-09 NOTE — Progress Notes (Signed)
HPI [name] return today for followup. She is a very pleasant 50 year old woman with a long history of chest pain, and catheterization on 2 different occasions demonstrating no obstructive coronary disease. The patient has had palpitations which occur very infrequently. She exerts herself on a regular basis and has no trouble walking on flat ground.  She is still having symptoms of chest pressure and was in the hospital and ruled out. Her blood pressure has recently been well controlled.  Allergies  Allergen Reactions  . Pumpkin Flavor Anaphylaxis    Squash also  . Salmon [Fish Allergy] Anaphylaxis  . Crestor [Rosuvastatin Calcium] Other (See Comments)    Muscle pain  . Lipitor [Atorvastatin] Other (See Comments)    Muscle pain  . Sulfonamide Derivatives Other (See Comments)    REACTION: GI distress  . Morphine And Related Rash     Current Outpatient Prescriptions  Medication Sig Dispense Refill  . Ascorbic Acid (VITAMIN C) 500 MG tablet Take 500 mg by mouth daily.      Marland Kitchen aspirin 81 MG tablet Take 81 mg by mouth at bedtime.     . Calcium Carbonate (CALCIUM 500 PO) Take 1,000 mg by mouth daily.     . canagliflozin (INVOKANA) 100 MG TABS tablet Take 100 mg by mouth daily.    . cetirizine (ZYRTEC) 10 MG tablet Take 10 mg by mouth at bedtime.     . Coenzyme Q10 (CO Q 10) 100 MG CAPS Take 100 mg by mouth daily.     . Cyanocobalamin (VITAMIN B 12) 250 MCG LOZG Take 2 tablets by mouth daily.     . DUREZOL 0.05 % EMUL Place 1 drop into the left eye 2 (two) times daily.  1  . Insulin Infusion Pump KIT as directed.     . insulin lispro (HUMALOG) 100 UNIT/ML injection Inject 41.4 Units into the skin as directed. 1.7 units/h at 12 am  1.4 units/h  3 AM,  2.0  Units/h at 12pm,  1.7 units/h at  3pm,  2.3  Units/h  at 8 pm,    total of 41.4 units daily-BASIL RATE IN INSULIN PUMP    . MAGNESIUM PO Take 1 tablet by mouth daily.     . Multiple Vitamin (MULTIVITAMIN) tablet Take 1 tablet by  mouth daily.      . nitroGLYCERIN (NITROSTAT) 0.4 MG SL tablet Place 1 tablet (0.4 mg total) under the tongue every 5 (five) minutes as needed for chest pain. 30 tablet 12  . pantoprazole (PROTONIX) 40 MG tablet Take 40 mg by mouth See admin instructions. Takes 40 mg every morning, can take a second 40 mg if needed for acid    . Pitavastatin Calcium (LIVALO) 2 MG TABS Take 2 mg by mouth at bedtime.     . ramipril (ALTACE) 2.5 MG capsule Take 2.5 mg by mouth at bedtime.    . sertraline (ZOLOFT) 100 MG tablet Take 100 mg by mouth daily before breakfast.    . sucralfate (CARAFATE) 1 G tablet Take 1 g by mouth daily as needed (stomach upset). GAS    . verapamil (CALAN-SR) 120 MG CR tablet Take 1 tablet (120 mg total) by mouth at bedtime. 30 tablet 30  . ZETIA 10 MG tablet TAKE 1 TAB (10 mg) BY MOUTH ONCE DAILY AT BEDTIME  3   No current facility-administered medications for this visit.      Past Medical History:  Diagnosis Date  . Anxiety   .  Chest pain    RECURRENT WITH NEGITIVE CATHERIZATIONS  . Complication of anesthesia    aspirated and "gained 15pounds, ended up in ICU"  . DM (diabetes mellitus) ([place])   . Dyslipidemia   . Fatty liver   . GERD (gastroesophageal reflux disease)   . History of CT scan    Coronary CTA 3/17: Calcium score 0; no plaque or stenosis noted in coronary arteries  . Hypercholesteremia   . Hypertension   . Obesity   . Pulmonary embolus ([place]) 2002  . Tachycardia     ROS:   All systems reviewed and negative except as noted in the HPI.   Past Surgical History:  Procedure Laterality Date  . CARDIAC CATHETERIZATION     x2  . CARPAL TUNNEL RELEASE     right  . CHOLECYSTECTOMY  03/24/2012   Procedure: LAPAROSCOPIC CHOLECYSTECTOMY WITH INTRAOPERATIVE CHOLANGIOGRAM;  Surgeon: Jocelyn Jolly, MD;  Location: [facility];  Service: General;  Laterality: N/A;  . ESOPHAGEAL MANOMETRY N/A 05/28/2015   Procedure: ESOPHAGEAL MANOMETRY (EM);  Surgeon: Jocelyn Shipper,  MD;  Location: [facility];  Service: Gastroenterology;  Laterality: N/A;  . FIBROID TUMOR     UTERUS SURGERY  . LAPAROSCOPIC HYSTERECTOMY       Family History  Problem Relation Age of Onset  . Irritable bowel syndrome Father   . Gallbladder disease Mother   . Gallbladder disease      3 mat. uncles  . Cancer Maternal Grandmother     ovarian  . Colon cancer Neg Hx      Social History   Social History  . Marital status: Single    Spouse name: N/A  . Number of children: 0  . Years of education: N/A   Occupational History  . registered dietician    Social History Main Topics  . Smoking status: Never Smoker  . Smokeless tobacco: Never Used  . Alcohol use Yes     Comment: rarely  . Drug use: No  . Sexual activity: Not on file   Other Topics Concern  . Not on file   Social History Narrative  . No narrative on file     BP 112/68   Pulse 100   Ht '5\' 1"'$  (1.549 m)   Wt 164 lb 9.6 oz (74.7 kg)   SpO2 97%   BMI 31.10 kg/m   Physical Exam:  obese appearing middle-aged woman, NAD HEENT: Unremarkable Neck:  6 cm JVD, no thyromegally Lymphatics:  No adenopathy Back:  No CVA tenderness Lungs:  Clear, with no wheezes, rales, or rhonchi. HEART:  Regular rate rhythm, no murmurs, no rubs, no clicks Abd:  soft, obese, positive bowel sounds, no organomegally, no rebound, no guarding Ext:  2 plus pulses, no edema, no cyanosis, no clubbing Skin:  No rashes no nodules Neuro:  CN II through XII intact, motor grossly intact  ecg - NSR   Assess/Plan: 1. Chest pain - her symptoms are not due to epicardial CAD.  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 Will continue verapamil.  2. Palpitations - she has sinus tachycardia. Hopefully this will improve after we start verapamil. 3. HTN - her blood pressure has been controlled. Will continue verapamil and plan to slowly uptitrate if needed for blood pressure increases or tachycardia. 4. Dyslipdemia - she has had side effects  from multiple statins and appears to be tolerating her Pitavastatin  Jocelyn [name].D.

## 2016-01-09 NOTE — Patient Instructions (Signed)
Medication Instructions:  Your physician recommends that you continue on your current medications as directed. Please refer to the Current Medication list given to you today.   Labwork: None Ordered   Testing/Procedures: None Ordered   Follow-Up: Your physician wants you to follow-up in: 6 months with Dr. Taylor.  You will receive a reminder letter in the mail two months in advance. If you don't receive a letter, please call our office to schedule the follow-up appointment.   If you need a refill on your cardiac medications before your next appointment, please call your pharmacy.   Thank you for choosing [facility]! Tyson Masin, RN [phone]    

## 2016-03-04 ENCOUNTER — Telehealth (HOSPITAL_COMMUNITY): Payer: Self-pay | Admitting: Cardiac Rehabilitation

## 2016-03-04 ENCOUNTER — Telehealth: Payer: Self-pay | Admitting: Cardiology

## 2016-03-04 ENCOUNTER — Encounter (HOSPITAL_COMMUNITY)
Admission: RE | Admit: 2016-03-04 | Discharge: 2016-03-04 | Disposition: A | Discharge status: Home / Self Care | Payer: BC Managed Care – PPO | Source: Ambulatory Visit | Attending: Internal Medicine | Admitting: Internal Medicine

## 2016-03-04 ENCOUNTER — Encounter (HOSPITAL_COMMUNITY): Payer: Self-pay

## 2016-03-04 VITALS — BP 108/66 | HR 100 | Ht 61.5 in | Wt 169.1 lb

## 2016-03-04 DIAGNOSIS — R079 Chest pain, unspecified: Secondary | ICD-10-CM | POA: Diagnosis present

## 2016-03-04 DIAGNOSIS — I208 Other forms of angina pectoris: Secondary | ICD-10-CM

## 2016-03-04 NOTE — Progress Notes (Signed)
[name] is here for orientation for cardiac rehab. Patient reports having chest discomfort of a 4/10 on a 1/10 scale non radiating. Telemetry rhythm Sinus rate 109. Oxygen saturation 98% on room air. Blood pressure 124/78. Heart rate 106. Harlan Stains NP called and notified. 12 lead ECG ordered and obtained. Mrs Jocelyn [name] says the discomfort has been constant ever since she reported the initiation of her discomfort. Ms Jocelyn [name] denies being in any distress. Skin warm and dry. Patient denies being   Harlan Stains NP  reviewed the 12 lead and talked with Ms Jocelyn [name] over the phone. Repeat blood pressure 108/70. Heart rate 95. Glori Bickers NP came to cardiac rehab and talked with Ms Jocelyn [name]. Appointment will be set up for office follow up and medication management. Ms Jocelyn [name] will hold off on starting exercise at cardiac rehab until she has been evaluated in the  Ms Jocelyn [name] was placed on 4l/min of oxygen via nasal cannula.  Jocelyn [name] did report feeling better after wearing the oxygen for 15 minutes. Jocelyn [name]'s exit level of chest of discomfort was a 3/10 upon exit form cardiac rehab. Exit blood pressure 118/62 heart rate 82. Oxygen saturation 100%. Oxygen discontinued. Jocelyn [name] left cardiac rehab to go to an appointment at the eye doctor.Barnet Pall, RN,BSN 03/04/2016 3:50 PM

## 2016-03-04 NOTE — Telephone Encounter (Signed)
Pt called cardiac rehab upon arriving home to report her chest pain has decreased to 2.5/10.  Pt reports this is usual for her.  Pt aware of schedule office followup appt and when to call 911.  Understanding verbalized.

## 2016-03-04 NOTE — Progress Notes (Signed)
Cardiac Rehab Medication Review by a Pharmacist  Does the patient  feel that his/her medications are working for him/her?  yes  Has the patient been experiencing any side effects to the medications prescribed?  no  Does the patient measure his/her own blood pressure or blood glucose at home?  yes   Does the patient have any problems obtaining medications due to transportation or finances?   no  Understanding of regimen: excellent Understanding of indications: excellent Potential of compliance: excellent    Pharmacist comments: Pt presents today for initial cardiac rehab visit. The only issue reported by pt is intermittent chest pain that has been ongoing for awhile. Pt's MD is aware and is planning to see pt today. No other issues noted and pt monitors BG continuously with insulin pump and BP regularly.   Arrie Senate, PharmD PGY-1 Pharmacy Resident Pager: [phone] 03/04/2016

## 2016-03-04 NOTE — Telephone Encounter (Addendum)
Called by Cardiac Rehab to see patient. Patient had just completed exercise on the treadmill and did experience 4/10 chest pain. EKG was obtained and noted SR with no acute ST/T wave changes, HR in the 90s. Briefly discussed with Dr. Lovena Le who is primary cardiologist. I did suggest to given SL nitro, but BP was in the low [id number] systolic. I talked with the patient, she reported that she always has this pain with exertion. She has had 2 cath with no noted CAD. It has been determined that she likely has microvascular disease. States she does not normally take nitro as it gives her a headache. She was noted to be very comfortable, eating a snack. I sent a message to arrange for follow up as there may be room to adjust her medications further. She was happy with this plan and voiced understanding. She was comfortable at the time of interaction.

## 2016-03-05 ENCOUNTER — Encounter (HOSPITAL_COMMUNITY): Payer: Self-pay

## 2016-03-05 NOTE — Progress Notes (Signed)
Cardiac Individual Treatment Plan  Patient Details  Name: Jocelyn [name] MRN: [id number] Date of Birth: [birth year] Referring Provider:   Flowsheet Row CARDIAC REHAB PHASE II ORIENTATION from 03/04/2016 in [place]  Referring Provider  Cristopher Peru, MD.      Initial Encounter Date:  Flowsheet Row CARDIAC REHAB PHASE II ORIENTATION from 03/04/2016 in [place]  Date  03/04/16  Referring Provider  Cristopher Peru, MD.      Visit Diagnosis: Chronic stable angina [facility])  Patient's Home Medications on Admission:  Current Outpatient Prescriptions:  .  Ascorbic Acid (VITAMIN C) 500 MG tablet, Take 500 mg by mouth daily.  , Disp: , Rfl:  .  aspirin 81 MG tablet, Take 81 mg by mouth at bedtime. , Disp: , Rfl:  .  Calcium Carbonate (CALCIUM 500 PO), Take 1,000 mg by mouth daily. , Disp: , Rfl:  .  cetirizine (ZYRTEC) 10 MG tablet, Take 10 mg by mouth at bedtime. , Disp: , Rfl:  .  cholecalciferol (VITAMIN D) 1000 units tablet, Take 1,000 Units by mouth daily., Disp: , Rfl:  .  Coenzyme Q10 (CO Q 10) 100 MG CAPS, Take 100 mg by mouth daily. , Disp: , Rfl:  .  Cyanocobalamin (VITAMIN B 12) 250 MCG LOZG, Take 1 tablet by mouth daily. , Disp: , Rfl:  .  Insulin Infusion Pump KIT, as directed. , Disp: , Rfl:  .  insulin lispro (HUMALOG) 100 UNIT/ML injection, Inject 41.4 Units into the skin as directed. 1.7 units/h at 12 am  1.4 units/h  3 AM,  2.0  Units/h at 12pm,  1.7 units/h at  3pm,  2.3  Units/h  at 8 pm,    total of 41.4 units daily-BASIL RATE IN INSULIN PUMP, Disp: , Rfl:  .  MAGNESIUM PO, Take 1 tablet by mouth daily. , Disp: , Rfl:  .  Multiple Vitamin (MULTIVITAMIN) tablet, Take 1 tablet by mouth daily.  , Disp: , Rfl:  .  nitroGLYCERIN (NITROSTAT) 0.4 MG SL tablet, Place 1 tablet (0.4 mg total) under the tongue every 5 (five) minutes as needed for chest pain., Disp: 30 tablet, Rfl: 12 .  pantoprazole (PROTONIX) 40 MG  tablet, Take 40 mg by mouth See admin instructions. Takes 40 mg every morning, can take a second 40 mg if needed for acid, Disp: , Rfl:  .  Pitavastatin Calcium (LIVALO) 2 MG TABS, Take 2 mg by mouth at bedtime. , Disp: , Rfl:  .  ramipril (ALTACE) 2.5 MG capsule, Take 2.5 mg by mouth at bedtime., Disp: , Rfl:  .  sertraline (ZOLOFT) 100 MG tablet, Take 100 mg by mouth daily before breakfast., Disp: , Rfl:  .  verapamil (CALAN-SR) 120 MG CR tablet, Take 1 tablet (120 mg total) by mouth at bedtime., Disp: 30 tablet, Rfl: 30 .  ZETIA 10 MG tablet, TAKE 1 TAB (10 mg) BY MOUTH ONCE DAILY AT BEDTIME, Disp: , Rfl: 3  Past Medical History: Past Medical History:  Diagnosis Date  . Anxiety   . Chest pain    RECURRENT WITH NEGITIVE CATHERIZATIONS  . Complication of anesthesia    aspirated and "gained 15pounds, ended up in ICU"  . DM (diabetes mellitus) ([place])   . Dyslipidemia   . Fatty liver   . GERD (gastroesophageal reflux disease)   . History of CT scan    Coronary CTA 3/17: Calcium score 0; no plaque or stenosis noted in coronary  arteries  . Hypercholesteremia   . Hypertension   . Obesity   . Pulmonary embolus ([place]) 2002  . Tachycardia     Tobacco Use: History  Smoking Status  . Never Smoker  Smokeless Tobacco  . Never Used    Labs: Recent Review Flowsheet Data    Labs for ITP Cardiac and Pulmonary Rehab Latest Ref Rng & Units 04/05/2015   Cholestrol 0 - 200 mg/dL 107   LDLCALC 0 - 99 mg/dL 33   HDL >40 mg/dL 54   Trlycerides <150 mg/dL 101      Capillary Blood Glucose: Lab Results  Component Value Date   GLUCAP 181 (H) 12/23/2015   GLUCAP 131 (H) 12/23/2015   GLUCAP 151 (H) 12/23/2015   GLUCAP 152 (H) 12/22/2015   GLUCAP 180 (H) 12/22/2015     Exercise Target Goals: Date: 03/04/16  Exercise Program Goal: Individual exercise prescription set with THRR, safety & activity barriers. Participant demonstrates ability to understand and report RPE using BORG scale, to  self-measure pulse accurately, and to acknowledge the importance of the exercise prescription.  Exercise Prescription Goal: Starting with aerobic activity 30 plus minutes a day, 3 days per week for initial exercise prescription. Provide home exercise prescription and guidelines that participant acknowledges understanding prior to discharge.  Activity Barriers & Risk Stratification:     Activity Barriers & Cardiac Risk Stratification - 03/04/16 1411      Activity Barriers & Cardiac Risk Stratification   Activity Barriers Deconditioning;Muscular Weakness;None;Shortness of Breath;Back Problems   Cardiac Risk Stratification Moderate      6 Minute Walk:     6 Minute Walk    Row Name 03/04/16 1354 03/04/16 1355       6 Minute Walk   Phase Initial Initial    Distance 1756 feet 1756 feet    Walk Time 6 minutes 6 minutes    # of Rest Breaks 0 0    MPH 3.33 3.32    METS 4.69 4.69    RPE 12 12    VO2 Peak 16.43 16.4    Symptoms Yes (comment) Yes (comment)  4/10 chest discomfort    Comments Patient c/o chest discomfort "4/10" on the pain scale.  -    Resting HR 100 bpm 102 bpm    Resting BP 108/66 108/66    Max Ex. HR 130 bpm 130 bpm    Max Ex. BP 128/74 128/74       Initial Exercise Prescription:     Initial Exercise Prescription - 03/04/16 1600      Date of Initial Exercise RX and Referring Provider   Date 03/04/16   Referring Provider Cristopher Peru, MD.     Treadmill   MPH 2.5   Grade 1   Minutes 10   METs 3.26     Bike   Level 1   Minutes 10   METs 3.52     NuStep   Level 3   Minutes 10   METs 2     Prescription Details   Frequency (times per week) 3   Duration Progress to 30 minutes of continuous aerobic without signs/symptoms of physical distress     Intensity   THRR 40-80% of Max Heartrate 68-135   Ratings of Perceived Exertion 11-13   Perceived Dyspnea 0-4     Progression   Progression Continue to progress workloads to maintain intensity  without signs/symptoms of physical distress.     Resistance Training   Training Prescription Yes  Weight 2lbs   Reps 10-12      Perform Capillary Blood Glucose checks as needed.  Exercise Prescription Changes:   Exercise Comments:   Discharge Exercise Prescription (Final Exercise Prescription Changes):   Nutrition:  Target Goals: Understanding of nutrition guidelines, daily intake of sodium <1553m, cholesterol <2048m calories 30% from fat and 7% or less from saturated fats, daily to have 5 or more servings of fruits and vegetables.  Biometrics:     Pre Biometrics - 03/04/16 1353      Pre Biometrics   Height 5' 1.5" (1.562 m)   Weight 169 lb 1.5 oz (76.7 kg)   Waist Circumference 36.75 inches   Hip Circumference 44 inches   Waist to Hip Ratio 0.84 %   BMI (Calculated) 31.5   Triceps Skinfold 40 mm   % Body Fat 42.7 %   Grip Strength 26 kg   Flexibility 9.75 in   Single Leg Stand 26.31 seconds       Nutrition Therapy Plan and Nutrition Goals:   Nutrition Discharge: Nutrition Scores:   Nutrition Goals Re-Evaluation:   Psychosocial: Target Goals: Acknowledge presence or absence of depression, maximize coping skills, provide positive support system. Participant is able to verbalize types and ability to use techniques and skills needed for reducing stress and depression.  Initial Review & Psychosocial Screening:     Initial Psych Review & Screening - 03/05/16 1215      Family Dynamics   Good Support System? Yes     Barriers   Psychosocial barriers to participate in program The patient should benefit from training in stress management and relaxation.     Screening Interventions   Interventions Encouraged to exercise      Quality of Life Scores:     Quality of Life - 03/04/16 1355      Quality of Life Scores   Health/Function Pre 21.04 %   Socioeconomic Pre 27.43 %   Psych/Spiritual Pre 27.43 %   Family Pre 18 %   GLOBAL Pre 23.63 %       PHQ-9: Recent Review Flowsheet Data    There is no flowsheet data to display.      Psychosocial Evaluation and Intervention:   Psychosocial Re-Evaluation:   Vocational Rehabilitation: Provide vocational rehab assistance to qualifying candidates.   Vocational Rehab Evaluation & Intervention:     Vocational Rehab - 03/05/16 1215      Initial Vocational Rehab Evaluation & Intervention   Assessment shows need for Vocational Rehabilitation No      Education: Education Goals: Education classes will be provided on a weekly basis, covering required topics. Participant will state understanding/return demonstration of topics presented.  Learning Barriers/Preferences:     Learning Barriers/Preferences - 03/04/16 1112      Learning Barriers/Preferences   Learning Barriers None   Learning Preferences Verbal Instruction;Written Material;Video;Skilled Demonstration;Group Instruction      Education Topics: Count Your Pulse:  -Group instruction provided by verbal instruction, demonstration, patient participation and written materials to support subject.  Instructors address importance of being able to find your pulse and how to count your pulse when at home without a heart monitor.  Patients get hands on experience counting their pulse with staff help and individually.   Heart Attack, Angina, and Risk Factor Modification:  -Group instruction provided by verbal instruction, video, and written materials to support subject.  Instructors address signs and symptoms of angina and heart attacks.    Also discuss risk factors for heart disease and  how to make changes to improve heart health risk factors.   Functional Fitness:  -Group instruction provided by verbal instruction, demonstration, patient participation, and written materials to support subject.  Instructors address safety measures for doing things around the house.  Discuss how to get up and down off the floor, how to pick  things up properly, how to safely get out of a chair without assistance, and balance training.   Meditation and Mindfulness:  -Group instruction provided by verbal instruction, patient participation, and written materials to support subject.  Instructor addresses importance of mindfulness and meditation practice to help reduce stress and improve awareness.  Instructor also leads participants through a meditation exercise.    Stretching for Flexibility and Mobility:  -Group instruction provided by verbal instruction, patient participation, and written materials to support subject.  Instructors lead participants through series of stretches that are designed to increase flexibility thus improving mobility.  These stretches are additional exercise for major muscle groups that are typically performed during regular warm up and cool down.   Hands Only CPR Anytime:  -Group instruction provided by verbal instruction, video, patient participation and written materials to support subject.  Instructors co-teach with AHA video for hands only CPR.  Participants get hands on experience with mannequins.   Nutrition I class: Heart Healthy Eating:  -Group instruction provided by PowerPoint slides, verbal discussion, and written materials to support subject matter. The instructor gives an explanation and review of the Therapeutic Lifestyle Changes diet recommendations, which includes a discussion on lipid goals, dietary fat, sodium, fiber, plant stanol/sterol esters, sugar, and the components of a well-balanced, healthy diet.   Nutrition II class: Lifestyle Skills:  -Group instruction provided by PowerPoint slides, verbal discussion, and written materials to support subject matter. The instructor gives an explanation and review of label reading, grocery shopping for heart health, heart healthy recipe modifications, and ways to make healthier choices when eating out.   Diabetes Question & Answer:  -Group  instruction provided by PowerPoint slides, verbal discussion, and written materials to support subject matter. The instructor gives an explanation and review of diabetes co-morbidities, pre- and post-prandial blood glucose goals, pre-exercise blood glucose goals, signs, symptoms, and treatment of hypoglycemia and hyperglycemia, and foot care basics.   Diabetes Blitz:  -Group instruction provided by PowerPoint slides, verbal discussion, and written materials to support subject matter. The instructor gives an explanation and review of the physiology behind type 1 and type 2 diabetes, diabetes medications and rational behind using different medications, pre- and post-prandial blood glucose recommendations and Hemoglobin A1c goals, diabetes diet, and exercise including blood glucose guidelines for exercising safely.    Portion Distortion:  -Group instruction provided by PowerPoint slides, verbal discussion, written materials, and food models to support subject matter. The instructor gives an explanation of serving size versus portion size, changes in portions sizes over the last 20 years, and what consists of a serving from each food group.   Stress Management:  -Group instruction provided by verbal instruction, video, and written materials to support subject matter.  Instructors review role of stress in heart disease and how to cope with stress positively.     Exercising on Your Own:  -Group instruction provided by verbal instruction, power point, and written materials to support subject.  Instructors discuss benefits of exercise, components of exercise, frequency and intensity of exercise, and end points for exercise.  Also discuss use of nitroglycerin and activating EMS.  Review options of places to exercise outside of rehab.  Review guidelines for sex with heart disease.   Cardiac Drugs I:  -Group instruction provided by verbal instruction and written materials to support subject.  Instructor  reviews cardiac drug classes: antiplatelets, anticoagulants, beta blockers, and statins.  Instructor discusses reasons, side effects, and lifestyle considerations for each drug class.   Cardiac Drugs II:  -Group instruction provided by verbal instruction and written materials to support subject.  Instructor reviews cardiac drug classes: angiotensin converting enzyme inhibitors (ACE-I), angiotensin II receptor blockers (ARBs), nitrates, and calcium channel blockers.  Instructor discusses reasons, side effects, and lifestyle considerations for each drug class.   Anatomy and Physiology of the Circulatory System:  -Group instruction provided by verbal instruction, video, and written materials to support subject.  Reviews functional anatomy of heart, how it relates to various diagnoses, and what role the heart plays in the overall system.   Knowledge Questionnaire Score:     Knowledge Questionnaire Score - 03/04/16 1351      Knowledge Questionnaire Score   Pre Score 23/24      Core Components/Risk Factors/Patient Goals at Admission:     Personal Goals and Risk Factors at Admission - 03/04/16 1414      Core Components/Risk Factors/Patient Goals on Admission   Increase Strength and Stamina Yes   Intervention Provide advice, education, support and counseling about physical activity/exercise needs.;Develop an individualized exercise prescription for aerobic and resistive training based on initial evaluation findings, risk stratification, comorbidities and participant's personal goals.   Expected Outcomes Achievement of increased cardiorespiratory fitness and enhanced flexibility, muscular endurance and strength shown through measurements of functional capacity and personal statement of participant.   Tobacco Cessation Yes   Intervention Assist the participant in steps to quit. Provide individualized education and counseling about committing to Tobacco Cessation, relapse prevention, and  pharmacological support that can be provided by physician.;Advice worker, assist with locating and accessing local/national Quit Smoking programs, and support quit date choice.   Expected Outcomes Short Term: Will demonstrate readiness to quit, by selecting a quit date.;Short Term: Will quit all tobacco product use, adhering to prevention of relapse plan.;Long Term: Complete abstinence from all tobacco products for at least 12 months from quit date.   Personal Goal Other Yes   Personal Goal short: increase fitness levels and decrease aches and pains with fitness. Long: increase ROM and flexibility   Intervention Provide exercise programming to assist with improving cardiovascular fitness, improve ROM and flexibility   Expected Outcomes Pt will be able to develop an exercise routine, improve flexibility and ROM      Core Components/Risk Factors/Patient Goals Review:    Core Components/Risk Factors/Patient Goals at Discharge (Final Review):    ITP Comments:     ITP Comments    Row Name 03/04/16 1112           ITP Comments Dr. Golden Hurter, Medical Director           Comments: .Patient attended orientation from 1315 to 1515 to review rules and guidelines for program. Completed 6 minute walk test, Intitial ITP, and exercise prescription.  VSS. Telemetry-Sinus . Debbie reported having chest discomfort at the end of her walk test. 12 lead ECG obtained. Harlan Stains NP called and notified. See progress note for detailed note.Barnet Pall, RN,BSN 03/05/2016 12:35 PM

## 2016-03-10 ENCOUNTER — Telehealth: Payer: Self-pay | Admitting: Internal Medicine

## 2016-03-10 ENCOUNTER — Encounter (HOSPITAL_COMMUNITY): Payer: BC Managed Care – PPO

## 2016-03-10 NOTE — Telephone Encounter (Signed)
Called, spoke with pt. Pt stated she started having chest pain on 03/04/16 while at cardiac rehab (cardiac rehab initiated by PCP). Pt is having sharp cp that radiates to shoulders. Pt experiencing SOB with exertion, fingers numb and nausea. Pt rates her pain today at a 5. BP today 126/74, HR 110. Informed I would forward to Dr. Acie Fredrickson (DOD) today and call pt back with recommendation.

## 2016-03-10 NOTE — Telephone Encounter (Signed)
Called, spoke with pt. Dr. Acie Fredrickson reviewed pt hx. Pt has a long hx of chest pain. Last 2 caths demonstrated no obstructive coronary disease. Pt should report the emergency department with any urgent concerns/persistant or worsening chest pain, dizziness, lightheadedness. Dr. Lovena Le will follow-up concerning medication adjustment. Someone will f/u with pt tomorrow. Informed pt if BP is within normal limits, pt may take Nitroglycerin sublingual tablet. Gave pt instructions regarding medication (not to drive while taking, be seated, if pt must take a 3rd dose, call 911). Pt has f/u appt with Dr. Lovena Le on 03/19/16. Will check to see if we have an earlier appt available. Pt verbalized understanding and agreed with plan.

## 2016-03-10 NOTE — Telephone Encounter (Signed)
New message    Patient calling still having pain.    no chest pain   Cardiac rehab was discontinue on last Tuesday 11.21

## 2016-03-11 ENCOUNTER — Encounter: Payer: Self-pay | Admitting: Internal Medicine

## 2016-03-11 NOTE — Telephone Encounter (Signed)
Called, spoke with pt. Wanted to f/u from yesterday. Pt stated her pain is better now. Pt has appointment to see Dr. Lovena Le tomorrow.

## 2016-03-12 ENCOUNTER — Ambulatory Visit (INDEPENDENT_AMBULATORY_CARE_PROVIDER_SITE_OTHER): Payer: BC Managed Care – PPO | Admitting: Internal Medicine

## 2016-03-12 ENCOUNTER — Encounter (HOSPITAL_COMMUNITY): Payer: BC Managed Care – PPO

## 2016-03-12 ENCOUNTER — Encounter: Payer: Self-pay | Admitting: Internal Medicine

## 2016-03-12 VITALS — BP 112/72 | HR 89 | Ht 61.5 in | Wt 168.0 lb

## 2016-03-12 DIAGNOSIS — R079 Chest pain, unspecified: Secondary | ICD-10-CM | POA: Diagnosis not present

## 2016-03-12 DIAGNOSIS — G629 Polyneuropathy, unspecified: Secondary | ICD-10-CM | POA: Diagnosis not present

## 2016-03-12 NOTE — Patient Instructions (Addendum)
Medication Instructions:  Your physician recommends that you continue on your current medications as directed. Please refer to the Current Medication list given to you today.    Labwork: None Ordered   Testing/Procedures: None Ordered   Follow-Up: Referral to Dr. Floyde Parkins for Diabetic Neuropathy   Follow-up appointment with Dr. Lovena Le in March 2018. You will receive a reminder letter in the mail two months in advance. If you don't receive a letter, please call our office to schedule the follow-up appointment.   Any Other Special Instructions Will Be Listed Below (If Applicable). You may return to cardiac rehab with no restrictions.    If you need a refill on your cardiac medications before your next appointment, please call your pharmacy.

## 2016-03-12 NOTE — Progress Notes (Signed)
HPI Jocelyn [name] return today for followup. She is a very pleasant 50 year old woman with a long history of chest pain, and catheterization on 2 different occasions demonstrating no obstructive coronary disease. The patient has been referred to cardiac rehab. She has chest pain daily which is non-exertional mostly. While doing her 6 minute walk test she had more chest pain and was promptly stopped. She continues with acid suppression and she has also noticed some hand numbness and was started on neurontin by her primary and requests a referral back to her neurologist Dr. Jannifer Franklin who she has not seen in 5 years.  Allergies  Allergen Reactions  . Pumpkin Flavor Anaphylaxis    Squash also  . Salmon [Fish Allergy] Anaphylaxis  . Crestor [Rosuvastatin Calcium] Other (See Comments)    Muscle pain  . Lipitor [Atorvastatin] Other (See Comments)    Muscle pain  . Sulfonamide Derivatives Other (See Comments)    REACTION: GI distress  . Morphine And Related Rash     Current Outpatient Prescriptions  Medication Sig Dispense Refill  . Ascorbic Acid (VITAMIN C) 500 MG tablet Take 500 mg by mouth daily.      Marland Kitchen aspirin 81 MG tablet Take 81 mg by mouth at bedtime.     . Calcium Carbonate (CALCIUM 500 PO) Take 1,000 mg by mouth daily.     . cetirizine (ZYRTEC) 10 MG tablet Take 10 mg by mouth at bedtime.     . cholecalciferol (VITAMIN D) 1000 units tablet Take 1,000 Units by mouth daily.    . Coenzyme Q10 (CO Q 10) 100 MG CAPS Take 100 mg by mouth daily.     . Cyanocobalamin (VITAMIN B 12) 250 MCG LOZG Take 1 tablet by mouth daily.     Marland Kitchen gabapentin (NEURONTIN) 100 MG capsule Take 1 capsule by mouth daily.    . Insulin Infusion Pump KIT as directed.     Marland Kitchen MAGNESIUM PO Take 1 tablet by mouth daily.     . Multiple Vitamin (MULTIVITAMIN) tablet Take 1 tablet by mouth daily.      . nitroGLYCERIN (NITROSTAT) 0.4 MG SL tablet Place 1 tablet (0.4 mg total) under the tongue every 5 (five) minutes as needed  for chest pain. 30 tablet 12  . NOVOLOG 100 UNIT/ML injection as directed.    . pantoprazole (PROTONIX) 40 MG tablet Take 40 mg by mouth See admin instructions. Takes 40 mg every morning, can take a second 40 mg if needed for acid    . Pitavastatin Calcium (LIVALO) 2 MG TABS Take 2 mg by mouth at bedtime.     . ramipril (ALTACE) 2.5 MG capsule Take 2.5 mg by mouth at bedtime.    . sertraline (ZOLOFT) 100 MG tablet Take 100 mg by mouth daily before breakfast.    . verapamil (CALAN-SR) 120 MG CR tablet Take 1 tablet (120 mg total) by mouth at bedtime. 30 tablet 30  . ZETIA 10 MG tablet TAKE 1 TAB (10 mg) BY MOUTH ONCE DAILY AT BEDTIME  3   No current facility-administered medications for this visit.      Past Medical History:  Diagnosis Date  . Anxiety   . Chest pain    RECURRENT WITH NEGITIVE CATHERIZATIONS  . Complication of anesthesia    aspirated and "gained 15pounds, ended up in ICU"  . DM (diabetes mellitus) ([place])   . Dyslipidemia   . Fatty liver   . GERD (gastroesophageal reflux disease)   .  History of CT scan    Coronary CTA 3/17: Calcium score 0; no plaque or stenosis noted in coronary arteries  . Hypercholesteremia   . Hypertension   . Obesity   . Pulmonary embolus ([place]) 2002  . Tachycardia     ROS:   All systems reviewed and negative except as noted in the HPI.   Past Surgical History:  Procedure Laterality Date  . CARDIAC CATHETERIZATION     x2  . CARPAL TUNNEL RELEASE     right  . CHOLECYSTECTOMY  03/24/2012   Procedure: LAPAROSCOPIC CHOLECYSTECTOMY WITH INTRAOPERATIVE CHOLANGIOGRAM;  Surgeon: Edward Jolly, MD;  Location: [facility];  Service: General;  Laterality: N/A;  . ESOPHAGEAL MANOMETRY N/A 05/28/2015   Procedure: ESOPHAGEAL MANOMETRY (EM);  Surgeon: Irene Shipper, MD;  Location: [facility];  Service: Gastroenterology;  Laterality: N/A;  . FIBROID TUMOR     UTERUS SURGERY  . LAPAROSCOPIC HYSTERECTOMY       Family History  Problem Relation  Age of Onset  . Irritable bowel syndrome Father   . Gallbladder disease Mother   . Gallbladder disease      3 mat. uncles  . Cancer Maternal Grandmother     ovarian  . Colon cancer Neg Hx      Social History   Social History  . Marital status: Single    Spouse name: N/A  . Number of children: 0  . Years of education: N/A   Occupational History  . registered dietician    Social History Main Topics  . Smoking status: Never Smoker  . Smokeless tobacco: Never Used  . Alcohol use Yes     Comment: rarely  . Drug use: No  . Sexual activity: Not on file   Other Topics Concern  . Not on file   Social History Narrative  . No narrative on file     BP 112/72   Pulse 89   Ht 5' 1.5" (1.562 m)   Wt 168 lb (76.2 kg)   BMI 31.23 kg/m   Physical Exam:  obese appearing middle-aged woman, NAD HEENT: Unremarkable Neck:  6 cm JVD, no thyromegally Lymphatics:  No adenopathy Back:  No CVA tenderness Lungs:  Clear, with no wheezes, rales, or rhonchi. HEART:  Regular rate rhythm, no murmurs, no rubs, no clicks Abd:  soft, obese, positive bowel sounds, no organomegally, no rebound, no guarding Ext:  2 plus pulses, no edema, no cyanosis, no clubbing Skin:  No rashes no nodules Neuro:  CN II through XII intact, motor grossly intact  ecg - NSR   Assess/Plan: 1. Chest pain - her symptoms are not due to epicardial CAD.  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 Will continue verapamil. She is encouraged to return to cardiac rehab. I do not think she needs to stop exercising if she has chest pressure.   2. Palpitations - she has sinus tachycardia. This has improved. 3. HTN - her blood pressure has been controlled. Will continue verapamil and plan to slowly uptitrate if needed for blood pressure increases or tachycardia. 4. Dyslipdemia - she has had side effects from multiple statins and appears to be tolerating her Pitavastatin  Mikle Bosworth.D.

## 2016-03-14 ENCOUNTER — Encounter (HOSPITAL_COMMUNITY): Payer: BC Managed Care – PPO

## 2016-03-17 ENCOUNTER — Encounter (HOSPITAL_COMMUNITY): Payer: BC Managed Care – PPO

## 2016-03-19 ENCOUNTER — Encounter (HOSPITAL_COMMUNITY): Payer: BC Managed Care – PPO

## 2016-03-19 ENCOUNTER — Ambulatory Visit: Payer: BC Managed Care – PPO | Admitting: Internal Medicine

## 2016-03-19 NOTE — Progress Notes (Signed)
Cardiac Individual Treatment Plan  Patient Details  Name: Jocelyn [name] MRN: [id number] Date of Birth: [birth year] Referring Provider:   Flowsheet Row CARDIAC REHAB PHASE II ORIENTATION from 03/04/2016 in [facility]  Referring Provider  Cristopher Peru, MD.      Initial Encounter Date:  Flowsheet Row CARDIAC REHAB PHASE II ORIENTATION from 03/04/2016 in [facility]  Date  03/04/16  Referring Provider  Cristopher Peru, MD.      Visit Diagnosis: No diagnosis found.  Patient's Home Medications on Admission:  Current Outpatient Prescriptions:  .  Ascorbic Acid (VITAMIN C) 500 MG tablet, Take 500 mg by mouth daily.  , Disp: , Rfl:  .  aspirin 81 MG tablet, Take 81 mg by mouth at bedtime. , Disp: , Rfl:  .  Calcium Carbonate (CALCIUM 500 PO), Take 1,000 mg by mouth daily. , Disp: , Rfl:  .  cetirizine (ZYRTEC) 10 MG tablet, Take 10 mg by mouth at bedtime. , Disp: , Rfl:  .  cholecalciferol (VITAMIN D) 1000 units tablet, Take 1,000 Units by mouth daily., Disp: , Rfl:  .  Coenzyme Q10 (CO Q 10) 100 MG CAPS, Take 100 mg by mouth daily. , Disp: , Rfl:  .  Cyanocobalamin (VITAMIN B 12) 250 MCG LOZG, Take 1 tablet by mouth daily. , Disp: , Rfl:  .  gabapentin (NEURONTIN) 100 MG capsule, Take 1 capsule by mouth daily., Disp: , Rfl:  .  Insulin Infusion Pump KIT, as directed. , Disp: , Rfl:  .  MAGNESIUM PO, Take 1 tablet by mouth daily. , Disp: , Rfl:  .  Multiple Vitamin (MULTIVITAMIN) tablet, Take 1 tablet by mouth daily.  , Disp: , Rfl:  .  nitroGLYCERIN (NITROSTAT) 0.4 MG SL tablet, Place 1 tablet (0.4 mg total) under the tongue every 5 (five) minutes as needed for chest pain., Disp: 30 tablet, Rfl: 12 .  NOVOLOG 100 UNIT/ML injection, as directed., Disp: , Rfl:  .  pantoprazole (PROTONIX) 40 MG tablet, Take 40 mg by mouth See admin instructions. Takes 40 mg every morning, can take a second 40 mg if needed for acid, Disp: , Rfl:  .   Pitavastatin Calcium (LIVALO) 2 MG TABS, Take 2 mg by mouth at bedtime. , Disp: , Rfl:  .  ramipril (ALTACE) 2.5 MG capsule, Take 2.5 mg by mouth at bedtime., Disp: , Rfl:  .  sertraline (ZOLOFT) 100 MG tablet, Take 100 mg by mouth daily before breakfast., Disp: , Rfl:  .  verapamil (CALAN-SR) 120 MG CR tablet, Take 1 tablet (120 mg total) by mouth at bedtime., Disp: 30 tablet, Rfl: 30 .  ZETIA 10 MG tablet, TAKE 1 TAB (10 mg) BY MOUTH ONCE DAILY AT BEDTIME, Disp: , Rfl: 3  Past Medical History: Past Medical History:  Diagnosis Date  . Anxiety   . Chest pain    RECURRENT WITH NEGITIVE CATHERIZATIONS  . Complication of anesthesia    aspirated and "gained 15pounds, ended up in ICU"  . DM (diabetes mellitus) ([place])   . Dyslipidemia   . Fatty liver   . GERD (gastroesophageal reflux disease)   . History of CT scan    Coronary CTA 3/17: Calcium score 0; no plaque or stenosis noted in coronary arteries  . Hypercholesteremia   . Hypertension   . Obesity   . Pulmonary embolus ([place]) 2002  . Tachycardia     Tobacco Use: History  Smoking Status  . Never  Smoker  Smokeless Tobacco  . Never Used    Labs: Recent Review Flowsheet Data    Labs for ITP Cardiac and Pulmonary Rehab Latest Ref Rng & Units 04/05/2015   Cholestrol 0 - 200 mg/dL 203   LDLCALC 0 - 99 mg/dL 33   HDL >40 mg/dL 54   Trlycerides <617 mg/dL 064      Capillary Blood Glucose: Lab Results  Component Value Date   GLUCAP 181 (H) 12/23/2015   GLUCAP 131 (H) 12/23/2015   GLUCAP 151 (H) 12/23/2015   GLUCAP 152 (H) 12/22/2015   GLUCAP 180 (H) 12/22/2015     Exercise Target Goals:    Exercise Program Goal: Individual exercise prescription set with THRR, safety & activity barriers. Participant demonstrates ability to understand and report RPE using BORG scale, to self-measure pulse accurately, and to acknowledge the importance of the exercise prescription.  Exercise Prescription Goal: Starting with aerobic  activity 30 plus minutes a day, 3 days per week for initial exercise prescription. Provide home exercise prescription and guidelines that participant acknowledges understanding prior to discharge.  Activity Barriers & Risk Stratification:     Activity Barriers & Cardiac Risk Stratification - 03/04/16 1411      Activity Barriers & Cardiac Risk Stratification   Activity Barriers Deconditioning;Muscular Weakness;None;Shortness of Breath;Back Problems   Cardiac Risk Stratification Moderate      6 Minute Walk:     6 Minute Walk    Row Name 03/04/16 1354 03/04/16 1355       6 Minute Walk   Phase Initial Initial    Distance 1756 feet 1756 feet    Walk Time 6 minutes 6 minutes    # of Rest Breaks 0 0    MPH 3.33 3.32    METS 4.69 4.69    RPE 12 12    VO2 Peak 16.43 16.4    Symptoms Yes (comment) Yes (comment)  4/10 chest discomfort    Comments Patient c/o chest discomfort "4/10" on the pain scale.  -    Resting HR 100 bpm 102 bpm    Resting BP 108/66 108/66    Max Ex. HR 130 bpm 130 bpm    Max Ex. BP 128/74 128/74       Initial Exercise Prescription:     Initial Exercise Prescription - 03/04/16 1600      Date of Initial Exercise RX and Referring Provider   Date 03/04/16   Referring Provider Lewayne Bunting, MD.     Treadmill   MPH 2.5   Grade 1   Minutes 10   METs 3.26     Bike   Level 1   Minutes 10   METs 3.52     NuStep   Level 3   Minutes 10   METs 2     Prescription Details   Frequency (times per week) 3   Duration Progress to 30 minutes of continuous aerobic without signs/symptoms of physical distress     Intensity   THRR 40-80% of Max Heartrate 68-135   Ratings of Perceived Exertion 11-13   Perceived Dyspnea 0-4     Progression   Progression Continue to progress workloads to maintain intensity without signs/symptoms of physical distress.     Resistance Training   Training Prescription Yes   Weight 2lbs   Reps 10-12      Perform Capillary  Blood Glucose checks as needed.  Exercise Prescription Changes:   Exercise Comments:   Discharge Exercise Prescription (Final Exercise Prescription  Changes):   Nutrition:  Target Goals: Understanding of nutrition guidelines, daily intake of sodium '1500mg'$ , cholesterol '200mg'$ , calories 30% from fat and 7% or less from saturated fats, daily to have 5 or more servings of fruits and vegetables.  Biometrics:     Pre Biometrics - 03/04/16 1353      Pre Biometrics   Height 5' 1.5" (1.562 m)   Weight 169 lb 1.5 oz (76.7 kg)   Waist Circumference 36.75 inches   Hip Circumference 44 inches   Waist to Hip Ratio 0.84 %   BMI (Calculated) 31.5   Triceps Skinfold 40 mm   % Body Fat 42.7 %   Grip Strength 26 kg   Flexibility 9.75 in   Single Leg Stand 26.31 seconds       Nutrition Therapy Plan and Nutrition Goals:     Nutrition Therapy & Goals - 03/19/16 1337      Nutrition Therapy   Diet Carb Modified, Therapeutic Lifestyle Changes     Personal Nutrition Goals   Personal Goal #1 1-2 lb wt loss/week to a wt loss goal of 6-24 lb at graduation from [facility], educate and counsel regarding individualized specific dietary modifications aiming towards targeted core components such as weight, hypertension, lipid management, diabetes, heart failure and other comorbidities.   Expected Outcomes Long Term Goal: Adherence to prescribed nutrition plan.      Nutrition Discharge: Nutrition Scores:   Nutrition Goals Re-Evaluation:   Psychosocial: Target Goals: Acknowledge presence or absence of depression, maximize coping skills, provide positive support system. Participant is able to verbalize types and ability to use techniques and skills needed for reducing stress and depression.  Initial Review & Psychosocial Screening:     Initial Psych Review & Screening - 03/05/16 1215      Family Dynamics   Good Support System? Yes      Barriers   Psychosocial barriers to participate in program The patient should benefit from training in stress management and relaxation.     Screening Interventions   Interventions Encouraged to exercise      Quality of Life Scores:     Quality of Life - 03/04/16 1355      Quality of Life Scores   Health/Function Pre 21.04 %   Socioeconomic Pre 27.43 %   Psych/Spiritual Pre 27.43 %   Family Pre 18 %   GLOBAL Pre 23.63 %      PHQ-9: Recent Review Flowsheet Data    There is no flowsheet data to display.      Psychosocial Evaluation and Intervention:   Psychosocial Re-Evaluation:   Vocational Rehabilitation: Provide vocational rehab assistance to qualifying candidates.   Vocational Rehab Evaluation & Intervention:     Vocational Rehab - 03/05/16 1215      Initial Vocational Rehab Evaluation & Intervention   Assessment shows need for Vocational Rehabilitation No      Education: Education Goals: Education classes will be provided on a weekly basis, covering required topics. Participant will state understanding/return demonstration of topics presented.  Learning Barriers/Preferences:     Learning Barriers/Preferences - 03/04/16 1112      Learning Barriers/Preferences   Learning Barriers None   Learning Preferences Verbal Instruction;Written Material;Video;Skilled Demonstration;Group Instruction      Education Topics: Count Your Pulse:  -Group instruction provided by verbal instruction, demonstration, patient participation and written materials to support subject.  Instructors address importance of being able to find your pulse and  how to count your pulse when at home without a heart monitor.  Patients get hands on experience counting their pulse with staff help and individually.   Heart Attack, Angina, and Risk Factor Modification:  -Group instruction provided by verbal instruction, video, and written materials to support subject.  Instructors address  signs and symptoms of angina and heart attacks.    Also discuss risk factors for heart disease and how to make changes to improve heart health risk factors.   Functional Fitness:  -Group instruction provided by verbal instruction, demonstration, patient participation, and written materials to support subject.  Instructors address safety measures for doing things around the house.  Discuss how to get up and down off the floor, how to pick things up properly, how to safely get out of a chair without assistance, and balance training.   Meditation and Mindfulness:  -Group instruction provided by verbal instruction, patient participation, and written materials to support subject.  Instructor addresses importance of mindfulness and meditation practice to help reduce stress and improve awareness.  Instructor also leads participants through a meditation exercise.    Stretching for Flexibility and Mobility:  -Group instruction provided by verbal instruction, patient participation, and written materials to support subject.  Instructors lead participants through series of stretches that are designed to increase flexibility thus improving mobility.  These stretches are additional exercise for major muscle groups that are typically performed during regular warm up and cool down.   Hands Only CPR Anytime:  -Group instruction provided by verbal instruction, video, patient participation and written materials to support subject.  Instructors co-teach with AHA video for hands only CPR.  Participants get hands on experience with mannequins.   Nutrition I class: Heart Healthy Eating:  -Group instruction provided by PowerPoint slides, verbal discussion, and written materials to support subject matter. The instructor gives an explanation and review of the Therapeutic Lifestyle Changes diet recommendations, which includes a discussion on lipid goals, dietary fat, sodium, fiber, plant stanol/sterol esters, sugar, and  the components of a well-balanced, healthy diet.   Nutrition II class: Lifestyle Skills:  -Group instruction provided by PowerPoint slides, verbal discussion, and written materials to support subject matter. The instructor gives an explanation and review of label reading, grocery shopping for heart health, heart healthy recipe modifications, and ways to make healthier choices when eating out.   Diabetes Question & Answer:  -Group instruction provided by PowerPoint slides, verbal discussion, and written materials to support subject matter. The instructor gives an explanation and review of diabetes co-morbidities, pre- and post-prandial blood glucose goals, pre-exercise blood glucose goals, signs, symptoms, and treatment of hypoglycemia and hyperglycemia, and foot care basics.   Diabetes Blitz:  -Group instruction provided by PowerPoint slides, verbal discussion, and written materials to support subject matter. The instructor gives an explanation and review of the physiology behind type 1 and type 2 diabetes, diabetes medications and rational behind using different medications, pre- and post-prandial blood glucose recommendations and Hemoglobin A1c goals, diabetes diet, and exercise including blood glucose guidelines for exercising safely.    Portion Distortion:  -Group instruction provided by PowerPoint slides, verbal discussion, written materials, and food models to support subject matter. The instructor gives an explanation of serving size versus portion size, changes in portions sizes over the last 20 years, and what consists of a serving from each food group.   Stress Management:  -Group instruction provided by verbal instruction, video, and written materials to support subject matter.  Instructors review role of stress  in heart disease and how to cope with stress positively.     Exercising on Your Own:  -Group instruction provided by verbal instruction, power point, and written materials  to support subject.  Instructors discuss benefits of exercise, components of exercise, frequency and intensity of exercise, and end points for exercise.  Also discuss use of nitroglycerin and activating EMS.  Review options of places to exercise outside of rehab.  Review guidelines for sex with heart disease.   Cardiac Drugs I:  -Group instruction provided by verbal instruction and written materials to support subject.  Instructor reviews cardiac drug classes: antiplatelets, anticoagulants, beta blockers, and statins.  Instructor discusses reasons, side effects, and lifestyle considerations for each drug class.   Cardiac Drugs II:  -Group instruction provided by verbal instruction and written materials to support subject.  Instructor reviews cardiac drug classes: angiotensin converting enzyme inhibitors (ACE-I), angiotensin II receptor blockers (ARBs), nitrates, and calcium channel blockers.  Instructor discusses reasons, side effects, and lifestyle considerations for each drug class.   Anatomy and Physiology of the Circulatory System:  -Group instruction provided by verbal instruction, video, and written materials to support subject.  Reviews functional anatomy of heart, how it relates to various diagnoses, and what role the heart plays in the overall system.   Knowledge Questionnaire Score:     Knowledge Questionnaire Score - 03/19/16 1336      Knowledge Questionnaire Score   Pre Score 23/24  DM 14/15      Core Components/Risk Factors/Patient Goals at Admission:     Personal Goals and Risk Factors at Admission - 03/04/16 1414      Core Components/Risk Factors/Patient Goals on Admission   Increase Strength and Stamina Yes   Intervention Provide advice, education, support and counseling about physical activity/exercise needs.;Develop an individualized exercise prescription for aerobic and resistive training based on initial evaluation findings, risk stratification, comorbidities and  participant's personal goals.   Expected Outcomes Achievement of increased cardiorespiratory fitness and enhanced flexibility, muscular endurance and strength shown through measurements of functional capacity and personal statement of participant.   Tobacco Cessation Yes   Intervention Assist the participant in steps to quit. Provide individualized education and counseling about committing to Tobacco Cessation, relapse prevention, and pharmacological support that can be provided by physician.;Advice worker, assist with locating and accessing local/national Quit Smoking programs, and support quit date choice.   Expected Outcomes Short Term: Will demonstrate readiness to quit, by selecting a quit date.;Short Term: Will quit all tobacco product use, adhering to prevention of relapse plan.;Long Term: Complete abstinence from all tobacco products for at least 12 months from quit date.   Personal Goal Other Yes   Personal Goal short: increase fitness levels and decrease aches and pains with fitness. Long: increase ROM and flexibility   Intervention Provide exercise programming to assist with improving cardiovascular fitness, improve ROM and flexibility   Expected Outcomes Pt will be able to develop an exercise routine, improve flexibility and ROM      Core Components/Risk Factors/Patient Goals Review:    Core Components/Risk Factors/Patient Goals at Discharge (Final Review):    ITP Comments:     ITP Comments    Row Name 03/04/16 1112           ITP Comments Dr. Golden Hurter, Medical Director           Comments: Pt has not officially began exercise program due to medical hold for cardiac clearance. Pt plans to begin exercise class on 03/24/2016.  Recommend continued exercise and life style modification education including  stress management and relaxation techniques to decrease cardiac risk profile.

## 2016-03-21 ENCOUNTER — Encounter (HOSPITAL_COMMUNITY)
Admission: RE | Admit: 2016-03-21 | Discharge: 2016-03-21 | Disposition: A | Discharge status: Home / Self Care | Payer: BC Managed Care – PPO | Source: Ambulatory Visit | Attending: Internal Medicine | Admitting: Internal Medicine

## 2016-03-21 DIAGNOSIS — R079 Chest pain, unspecified: Secondary | ICD-10-CM | POA: Insufficient documentation

## 2016-03-24 ENCOUNTER — Encounter (HOSPITAL_COMMUNITY)
Admission: RE | Admit: 2016-03-24 | Discharge: 2016-03-24 | Disposition: A | Discharge status: Home / Self Care | Payer: BC Managed Care – PPO | Source: Ambulatory Visit | Attending: Internal Medicine | Admitting: Internal Medicine

## 2016-03-24 ENCOUNTER — Encounter (HOSPITAL_COMMUNITY): Payer: Self-pay

## 2016-03-24 DIAGNOSIS — R079 Chest pain, unspecified: Secondary | ICD-10-CM | POA: Diagnosis not present

## 2016-03-24 DIAGNOSIS — I208 Other forms of angina pectoris: Secondary | ICD-10-CM

## 2016-03-24 LAB — GLUCOSE, CAPILLARY: GLUCOSE-CAPILLARY: 214 mg/dL — AB (ref 65–99)

## 2016-03-24 NOTE — Progress Notes (Signed)
Daily Session Note  Patient Details  Name: Jocelyn [name] MRN: [id number] Date of Birth: February 03, 1966 Referring Provider:   Flowsheet Row CARDIAC REHAB PHASE II ORIENTATION from 03/04/2016 in [facility]  Referring Provider  Cristopher Peru, MD.      Encounter Date: 03/24/2016  Check In:     Session Check In - 03/24/16 0833      Check-In   Location [facility]   Staff Present Su Hilt, MS, ACSM RCEP, Exercise Physiologist;Amber Fair, MS, ACSM RCEP, Exercise Physiologist;Joann Rion, RN, BSN   Supervising physician immediately available to respond to emergencies [facility] immediately available   Physician(s) Dr. Allyson Sabal   Medication changes reported     No   Fall or balance concerns reported    No   Warm-up and Cool-down Performed as group-led instruction   Resistance Training Performed Yes   VAD Patient? No     Pain Assessment   Currently in Pain? No/denies   Multiple Pain Sites No      Capillary Blood Glucose: No results found for this or any previous visit (from the past 24 hour(s)).   Goals Met:  Exercise tolerated well  Goals Unmet:  Not Applicable  Comments: Pt started cardiac rehab today.  Pt tolerated light exercise without difficulty. VSS, telemetry- asymptomatic.  Medication list reconciled. Pt denies barriers to medicaiton compliance.  PSYCHOSOCIAL ASSESSMENT:  PHQ-0. Pt exhibits positive coping skills, hopeful outlook with supportive family. No psychosocial needs identified at this time, no psychosocial interventions necessary.    Pt enjoys life, she strives to find joy in all things.  Pt goals for cardiac rehab are to increase flexibility and fitness level.  Pt encouraged to participate in home exercise and core stretching education class to increase ability to achieve this goal.     Pt oriented to exercise equipment and routine.    Understanding verbalized.   Dr. Fransico Him is Medical Director for  Cardiac Rehab at [facility].

## 2016-03-26 ENCOUNTER — Encounter (HOSPITAL_COMMUNITY)
Admission: RE | Admit: 2016-03-26 | Discharge: 2016-03-26 | Disposition: A | Discharge status: Home / Self Care | Payer: BC Managed Care – PPO | Source: Ambulatory Visit | Attending: Internal Medicine | Admitting: Internal Medicine

## 2016-03-26 DIAGNOSIS — R079 Chest pain, unspecified: Secondary | ICD-10-CM | POA: Diagnosis not present

## 2016-03-26 DIAGNOSIS — I208 Other forms of angina pectoris: Secondary | ICD-10-CM

## 2016-03-28 ENCOUNTER — Encounter (HOSPITAL_COMMUNITY)
Admission: RE | Admit: 2016-03-28 | Discharge: 2016-03-28 | Disposition: A | Discharge status: Home / Self Care | Payer: BC Managed Care – PPO | Source: Ambulatory Visit | Attending: Internal Medicine | Admitting: Internal Medicine

## 2016-03-28 DIAGNOSIS — I208 Other forms of angina pectoris: Secondary | ICD-10-CM

## 2016-03-28 DIAGNOSIS — R079 Chest pain, unspecified: Secondary | ICD-10-CM | POA: Diagnosis not present

## 2016-03-31 ENCOUNTER — Encounter (HOSPITAL_COMMUNITY)
Admission: RE | Admit: 2016-03-31 | Discharge: 2016-03-31 | Disposition: A | Discharge status: Home / Self Care | Payer: BC Managed Care – PPO | Source: Ambulatory Visit | Attending: Internal Medicine | Admitting: Internal Medicine

## 2016-03-31 DIAGNOSIS — I208 Other forms of angina pectoris: Secondary | ICD-10-CM

## 2016-03-31 DIAGNOSIS — R079 Chest pain, unspecified: Secondary | ICD-10-CM | POA: Diagnosis not present

## 2016-04-02 ENCOUNTER — Encounter (HOSPITAL_COMMUNITY)
Admission: RE | Admit: 2016-04-02 | Discharge: 2016-04-02 | Disposition: A | Discharge status: Home / Self Care | Payer: BC Managed Care – PPO | Source: Ambulatory Visit | Attending: Internal Medicine | Admitting: Internal Medicine

## 2016-04-02 DIAGNOSIS — I208 Other forms of angina pectoris: Secondary | ICD-10-CM

## 2016-04-02 DIAGNOSIS — R079 Chest pain, unspecified: Secondary | ICD-10-CM | POA: Diagnosis not present

## 2016-04-02 NOTE — Progress Notes (Signed)
Reviewed home exercise with pt today.  Pt plans to walk for exercise, 2x/week in addition to coming to cardiac rehab.  Reviewed THR, pulse, RPE, sign and symptoms, NTG use, and when to call 911 or MD.  Also discussed weather considerations and indoor options.  Pt voiced understanding.    Eunice Winecoff,MS,ACSM RCEP 

## 2016-04-04 ENCOUNTER — Encounter (HOSPITAL_COMMUNITY)
Admission: RE | Admit: 2016-04-04 | Discharge: 2016-04-04 | Disposition: A | Discharge status: Home / Self Care | Payer: BC Managed Care – PPO | Source: Ambulatory Visit | Attending: Internal Medicine | Admitting: Internal Medicine

## 2016-04-04 DIAGNOSIS — R079 Chest pain, unspecified: Secondary | ICD-10-CM | POA: Diagnosis not present

## 2016-04-04 DIAGNOSIS — I208 Other forms of angina pectoris: Secondary | ICD-10-CM

## 2016-04-09 ENCOUNTER — Encounter (HOSPITAL_COMMUNITY): Payer: BC Managed Care – PPO

## 2016-04-10 ENCOUNTER — Ambulatory Visit (INDEPENDENT_AMBULATORY_CARE_PROVIDER_SITE_OTHER): Payer: BC Managed Care – PPO | Admitting: Neurology

## 2016-04-10 ENCOUNTER — Encounter: Payer: Self-pay | Admitting: Neurology

## 2016-04-10 VITALS — BP 113/73 | HR 92 | Ht 61.5 in | Wt 168.8 lb

## 2016-04-10 DIAGNOSIS — R202 Paresthesia of skin: Secondary | ICD-10-CM

## 2016-04-10 NOTE — Progress Notes (Addendum)
Reason for visit: Numbness  Referring physician: Dr. Chuck Hint is a 50 y.o. female  History of present illness:  [name] is a 50 year old right-handed white female with a history of type 1 diabetes. The patient has had diabetes for greater than 40 years. She comes in today with reports of paresthesias involving the hands and feet. The patient has had some numbness in the hands that may occur at nighttime, she has had prior right carpal tunnel syndrome surgery. She has noted some numbness in both feet that is generally noticeable when she first sits up in the morning, or if she is doing a lot of walking during the day. The numbness is in the bottom of the feet. She does not report any burning or stinging sensations, she is able to sleep well at night without dysesthesias. The patient has noted some mild gait instability, no falls. She reports some occasional episodes of vertigo and nausea coming on with head movements, and these episodes may occur with sitting or standing, but are more likely to occur with standing. The patient may drop things from her hands. She denies any neck or low back pain, occasionally she may have some numbness that goes down the left leg. She comes to this office for an evaluation.  Past Medical History:  Diagnosis Date  . Anxiety   . Chest pain    RECURRENT WITH NEGITIVE CATHERIZATIONS  . Complication of anesthesia    aspirated and "gained 15pounds, ended up in ICU"  . DM (diabetes mellitus) ([place])   . Dyslipidemia   . Fatty liver   . GERD (gastroesophageal reflux disease)   . History of CT scan    Coronary CTA 3/17: Calcium score 0; no plaque or stenosis noted in coronary arteries  . Hypercholesteremia   . Hypertension   . Migraine   . Obesity   . Pulmonary embolus ([place]) 2002  . Tachycardia    A fib    Past Surgical History:  Procedure Laterality Date  . CARDIAC CATHETERIZATION     x2  . CARPAL TUNNEL RELEASE     right  .  CHOLECYSTECTOMY  03/24/2012   Procedure: LAPAROSCOPIC CHOLECYSTECTOMY WITH INTRAOPERATIVE CHOLANGIOGRAM;  Surgeon: Edward Jolly, MD;  Location: [facility];  Service: General;  Laterality: N/A;  . ESOPHAGEAL MANOMETRY N/A 05/28/2015   Procedure: ESOPHAGEAL MANOMETRY (EM);  Surgeon: Irene Shipper, MD;  Location: [facility];  Service: Gastroenterology;  Laterality: N/A;  . FIBROID TUMOR     UTERUS SURGERY  . LAPAROSCOPIC HYSTERECTOMY      Family History  Problem Relation Age of Onset  . Irritable bowel syndrome Father   . Gallbladder disease Mother   . Gallbladder disease      3 mat. uncles  . Cancer Maternal Grandmother     ovarian  . Colon cancer Neg Hx     Social history:  reports that she has never smoked. She has never used smokeless tobacco. She reports that she drinks alcohol. She reports that she does not use drugs.  Medications:  Prior to Admission medications   Medication Sig Start Date End Date Taking? Authorizing Provider  Ascorbic Acid (VITAMIN C) 500 MG tablet Take 500 mg by mouth daily.     Yes Historical Provider, MD  aspirin 81 MG tablet Take 81 mg by mouth at bedtime.    Yes Historical Provider, MD  Calcium Carbonate (CALCIUM 500 PO) Take 1,000 mg by mouth daily.  Yes Historical Provider, MD  cetirizine (ZYRTEC) 10 MG tablet Take 10 mg by mouth at bedtime.    Yes Historical Provider, MD  cholecalciferol (VITAMIN D) 1000 units tablet Take 1,000 Units by mouth daily.   Yes Historical Provider, MD  Coenzyme Q10 (CO Q 10) 100 MG CAPS Take 100 mg by mouth daily.    Yes Historical Provider, MD  Cyanocobalamin (VITAMIN B 12) 250 MCG LOZG Take 1 tablet by mouth daily.    Yes Historical Provider, MD  gabapentin (NEURONTIN) 100 MG capsule Take 1 capsule by mouth daily. 03/10/16  Yes Historical Provider, MD  Insulin Infusion Pump KIT as directed.    Yes Historical Provider, MD  MAGNESIUM PO Take 1 tablet by mouth daily.    Yes Historical Provider, MD  Multiple Vitamin  (MULTIVITAMIN) tablet Take 1 tablet by mouth daily.     Yes Historical Provider, MD  nitroGLYCERIN (NITROSTAT) 0.4 MG SL tablet Place 1 tablet (0.4 mg total) under the tongue every 5 (five) minutes as needed for chest pain. 04/05/15  Yes Ripudeep K Rai, MD  NOVOLOG 100 UNIT/ML injection as directed. 03/10/16  Yes Historical Provider, MD  pantoprazole (PROTONIX) 40 MG tablet Take 40 mg by mouth See admin instructions. Takes 40 mg every morning, can take a second 40 mg if needed for acid   Yes Historical Provider, MD  Pitavastatin Calcium (LIVALO) 2 MG TABS Take 2 mg by mouth at bedtime.    Yes Historical Provider, MD  ramipril (ALTACE) 2.5 MG capsule Take 2.5 mg by mouth at bedtime.   Yes Historical Provider, MD  sertraline (ZOLOFT) 100 MG tablet Take 100 mg by mouth daily before breakfast.   Yes Historical Provider, MD  verapamil (CALAN-SR) 120 MG CR tablet Take 1 tablet (120 mg total) by mouth at bedtime. 07/23/15  Yes Evans Lance, MD  ZETIA 10 MG tablet TAKE 1 TAB (10 mg) BY MOUTH ONCE DAILY AT BEDTIME 10/12/14  Yes Historical Provider, MD      Allergies  Allergen Reactions  . Pumpkin Flavor Anaphylaxis    Squash also  . Salmon [Fish Allergy] Anaphylaxis  . Crestor [Rosuvastatin Calcium] Other (See Comments)    Muscle pain  . Lipitor [Atorvastatin] Other (See Comments)    Muscle pain  . Sulfonamide Derivatives Other (See Comments)    REACTION: GI distress  . Morphine And Related Rash    ROS:  Out of a complete 14 system review of symptoms, the patient complains only of the following symptoms, and all other reviewed systems are negative.  Chest pain Dizziness Shortness of breath, snoring Allergies, skin sensitivity Memory loss, numbness, dizziness Insomnia, snoring  Blood pressure 113/73, pulse 92, height 5' 1.5" (1.562 m), weight 168 lb 12 oz (76.5 kg).  Physical Exam  General: The patient is alert and cooperative at the time of the examination. The patient is moderately  obese.  Eyes: Pupils are equal, round, and reactive to light. Discs are flat bilaterally.  Neck: The neck is supple, no carotid bruits are noted.  Respiratory: The respiratory examination is clear.  Cardiovascular: The cardiovascular examination reveals a regular rate and rhythm, no obvious murmurs or rubs are noted.  Skin: Extremities are without significant edema.  Neurologic Exam  Mental status: The patient is alert and oriented x 3 at the time of the examination. The patient has apparent normal recent and remote memory, with an apparently normal attention span and concentration ability.  Cranial nerves: Facial symmetry is present. There is  good sensation of the face to pinprick and soft touch bilaterally. The strength of the facial muscles and the muscles to head turning and shoulder shrug are normal bilaterally. Speech is well enunciated, no aphasia or dysarthria is noted. Extraocular movements are full. Visual fields are full. The tongue is midline, and the patient has symmetric elevation of the soft palate. No obvious hearing deficits are noted.  Motor: The motor testing reveals 5 over 5 strength of all 4 extremities. Good symmetric motor tone is noted throughout.  Sensory: Sensory testing is intact to pinprick, soft touch, vibration sensation, and position sense on all 4 extremities, with exception of a stocking pattern pinprick sensory deficit across the ankles bilaterally, and some decrease in vibration sensation on the left foot. No evidence of extinction is noted.  Coordination: Cerebellar testing reveals good finger-nose-finger and heel-to-shin bilaterally.  Gait and station: Gait is normal. Tandem gait is normal. Romberg is negative. No drift is seen. The patient is able to walk on heels and the toes bilaterally.  Reflexes: Deep tendon reflexes are symmetric, but are slightly depressed bilaterally. Toes are downgoing bilaterally.   Assessment/Plan:  1. Type 1  diabetes  2. Bilateral hand and foot numbness, possible neuropathy  3. Episodic vertigo  The patient has had prior carpal tunnel syndrome surgery on the right. The patient may have some recurrence of symptoms, and likely has an underlying mild peripheral neuropathy associated with diabetes. The patient will be set up for nerve conduction studies on both arms, and on one leg. EMG study will be done on one arm. She will follow-up for the above study.   Jill Alexanders MD 04/10/2016 9:20 AM  [facility] [address] [place], [place] [address]  Phone [phone] Fax [phone]

## 2016-04-11 ENCOUNTER — Encounter (HOSPITAL_COMMUNITY)
Admission: RE | Admit: 2016-04-11 | Discharge: 2016-04-11 | Disposition: A | Discharge status: Home / Self Care | Payer: BC Managed Care – PPO | Source: Ambulatory Visit | Attending: Internal Medicine | Admitting: Internal Medicine

## 2016-04-11 DIAGNOSIS — R079 Chest pain, unspecified: Secondary | ICD-10-CM | POA: Diagnosis not present

## 2016-04-11 DIAGNOSIS — I208 Other forms of angina pectoris: Secondary | ICD-10-CM

## 2016-04-16 ENCOUNTER — Encounter (HOSPITAL_COMMUNITY)
Admission: RE | Admit: 2016-04-16 | Discharge: 2016-04-16 | Disposition: A | Discharge status: Home / Self Care | Payer: BC Managed Care – PPO | Source: Ambulatory Visit | Attending: Internal Medicine | Admitting: Internal Medicine

## 2016-04-16 DIAGNOSIS — R079 Chest pain, unspecified: Secondary | ICD-10-CM | POA: Diagnosis not present

## 2016-04-16 DIAGNOSIS — I208 Other forms of angina pectoris: Secondary | ICD-10-CM

## 2016-04-16 NOTE — Progress Notes (Signed)
Cardiac Individual Treatment Plan  Patient Details  Name: Jocelyn [name] MRN: [id number] Date of Birth: Sep 05, 1965 Referring Provider:   Flowsheet Row CARDIAC REHAB PHASE II ORIENTATION from 03/04/2016 in [place]  Referring Provider  Cristopher Peru, MD.      Initial Encounter Date:  Flowsheet Row CARDIAC REHAB PHASE II ORIENTATION from 03/04/2016 in [place]  Date  03/04/16  Referring Provider  Cristopher Peru, MD.      Visit Diagnosis: Chronic stable angina [facility])  Patient's Home Medications on Admission:  Current Outpatient Prescriptions:  .  Ascorbic Acid (VITAMIN C) 500 MG tablet, Take 500 mg by mouth daily.  , Disp: , Rfl:  .  aspirin 81 MG tablet, Take 81 mg by mouth at bedtime. , Disp: , Rfl:  .  Calcium Carbonate (CALCIUM 500 PO), Take 1,000 mg by mouth daily. , Disp: , Rfl:  .  cetirizine (ZYRTEC) 10 MG tablet, Take 10 mg by mouth at bedtime. , Disp: , Rfl:  .  cholecalciferol (VITAMIN D) 1000 units tablet, Take 1,000 Units by mouth daily., Disp: , Rfl:  .  Coenzyme Q10 (CO Q 10) 100 MG CAPS, Take 100 mg by mouth daily. , Disp: , Rfl:  .  Cyanocobalamin (VITAMIN B 12) 250 MCG LOZG, Take 1 tablet by mouth daily. , Disp: , Rfl:  .  gabapentin (NEURONTIN) 100 MG capsule, Take 1 capsule by mouth daily., Disp: , Rfl:  .  Insulin Infusion Pump KIT, as directed. , Disp: , Rfl:  .  MAGNESIUM PO, Take 1 tablet by mouth daily. , Disp: , Rfl:  .  Multiple Vitamin (MULTIVITAMIN) tablet, Take 1 tablet by mouth daily.  , Disp: , Rfl:  .  nitroGLYCERIN (NITROSTAT) 0.4 MG SL tablet, Place 1 tablet (0.4 mg total) under the tongue every 5 (five) minutes as needed for chest pain., Disp: 30 tablet, Rfl: 12 .  NOVOLOG 100 UNIT/ML injection, as directed., Disp: , Rfl:  .  pantoprazole (PROTONIX) 40 MG tablet, Take 40 mg by mouth See admin instructions. Takes 40 mg every morning, can take a second 40 mg if needed for acid, Disp: ,  Rfl:  .  Pitavastatin Calcium (LIVALO) 2 MG TABS, Take 2 mg by mouth at bedtime. , Disp: , Rfl:  .  ramipril (ALTACE) 2.5 MG capsule, Take 2.5 mg by mouth at bedtime., Disp: , Rfl:  .  sertraline (ZOLOFT) 100 MG tablet, Take 100 mg by mouth daily before breakfast., Disp: , Rfl:  .  verapamil (CALAN-SR) 120 MG CR tablet, Take 1 tablet (120 mg total) by mouth at bedtime., Disp: 30 tablet, Rfl: 30 .  ZETIA 10 MG tablet, TAKE 1 TAB (10 mg) BY MOUTH ONCE DAILY AT BEDTIME, Disp: , Rfl: 3  Past Medical History: Past Medical History:  Diagnosis Date  . Anxiety   . Chest pain    RECURRENT WITH NEGITIVE CATHERIZATIONS  . Complication of anesthesia    aspirated and "gained 15pounds, ended up in ICU"  . DM (diabetes mellitus) ([place])   . Dyslipidemia   . Fatty liver   . GERD (gastroesophageal reflux disease)   . History of CT scan    Coronary CTA 3/17: Calcium score 0; no plaque or stenosis noted in coronary arteries  . Hypercholesteremia   . Hypertension   . Migraine   . Obesity   . Pulmonary embolus ([place]) 2002  . Tachycardia    A fib  Tobacco Use: History  Smoking Status  . Never Smoker  Smokeless Tobacco  . Never Used    Labs: Recent Review Flowsheet Data    Labs for ITP Cardiac and Pulmonary Rehab Latest Ref Rng & Units 04/05/2015   Cholestrol 0 - 200 mg/dL 107   LDLCALC 0 - 99 mg/dL 33   HDL >40 mg/dL 54   Trlycerides <150 mg/dL 101      Capillary Blood Glucose: Lab Results  Component Value Date   GLUCAP 214 (H) 03/24/2016   GLUCAP 181 (H) 12/23/2015   GLUCAP 131 (H) 12/23/2015   GLUCAP 151 (H) 12/23/2015   GLUCAP 152 (H) 12/22/2015     Exercise Target Goals:    Exercise Program Goal: Individual exercise prescription set with THRR, safety & activity barriers. Participant demonstrates ability to understand and report RPE using BORG scale, to self-measure pulse accurately, and to acknowledge the importance of the exercise prescription.  Exercise Prescription  Goal: Starting with aerobic activity 30 plus minutes a day, 3 days per week for initial exercise prescription. Provide home exercise prescription and guidelines that participant acknowledges understanding prior to discharge.  Activity Barriers & Risk Stratification:     Activity Barriers & Cardiac Risk Stratification - 03/04/16 1411      Activity Barriers & Cardiac Risk Stratification   Activity Barriers Deconditioning;Muscular Weakness;None;Shortness of Breath;Back Problems   Cardiac Risk Stratification Moderate      6 Minute Walk:     6 Minute Walk    Row Name 03/04/16 1354 03/04/16 1355       6 Minute Walk   Phase Initial Initial    Distance 1756 feet 1756 feet    Walk Time 6 minutes 6 minutes    # of Rest Breaks 0 0    MPH 3.33 3.32    METS 4.69 4.69    RPE 12 12    VO2 Peak 16.43 16.4    Symptoms Yes (comment) Yes (comment)  4/10 chest discomfort    Comments Patient c/o chest discomfort "4/10" on the pain scale.  -    Resting HR 100 bpm 102 bpm    Resting BP 108/66 108/66    Max Ex. HR 130 bpm 130 bpm    Max Ex. BP 128/74 128/74       Initial Exercise Prescription:     Initial Exercise Prescription - 03/04/16 1600      Date of Initial Exercise RX and Referring Provider   Date 03/04/16   Referring Provider Cristopher Peru, MD.     Treadmill   MPH 2.5   Grade 1   Minutes 10   METs 3.26     Bike   Level 1   Minutes 10   METs 3.52     NuStep   Level 3   Minutes 10   METs 2     Prescription Details   Frequency (times per week) 3   Duration Progress to 30 minutes of continuous aerobic without signs/symptoms of physical distress     Intensity   THRR 40-80% of Max Heartrate 68-135   Ratings of Perceived Exertion 11-13   Perceived Dyspnea 0-4     Progression   Progression Continue to progress workloads to maintain intensity without signs/symptoms of physical distress.     Resistance Training   Training Prescription Yes   Weight 2lbs   Reps  10-12      Perform Capillary Blood Glucose checks as needed.  Exercise Prescription Changes:  Exercise Prescription Changes    Row Name 04/03/16 1400 04/15/16 1000           Exercise Review   Progression Yes  -        Response to Exercise   Blood Pressure (Admit) 118/60 124/60      Blood Pressure (Exercise) 124/60 124/70      Blood Pressure (Exit) 114/60 110/62      Heart Rate (Admit) 97 bpm 97 bpm      Heart Rate (Exercise) 121 bpm 126 bpm      Heart Rate (Exit) 88 bpm 96 bpm      Rating of Perceived Exertion (Exercise) 13 15      Symptoms 0 0      Comments reviewed HEP on 04/02/16.. See progress note reviewed HEP on 04/02/16.. See progress note      Duration Progress to 30 minutes of continuous aerobic without signs/symptoms of physical distress Progress to 30 minutes of continuous aerobic without signs/symptoms of physical distress      Intensity THRR unchanged THRR unchanged        Progression   Progression Continue progressive overload as per policy without signs/symptoms or physical distress. Continue progressive overload as per policy without signs/symptoms or physical distress.      Average METs 3.1 3        Resistance Training   Training Prescription Yes Yes      Weight 2lbs 2lbs      Reps 10-12 10-12        Treadmill   MPH 2.5 2.5      Grade 1 1      Minutes 10 10      METs 3.26 3.26        Bike   Level 1 1      Minutes 10 10      METs 3.47 3.47        NuStep   Level 3 3      Minutes 10 10      METs 2.6 2.3        Home Exercise Plan   Plans to continue exercise at Home  see progress note. Plans to walk for exercise Home  see progress note. Plans to walk for exercise      Frequency Add 2 additional days to program exercise sessions. Add 2 additional days to program exercise sessions.         Exercise Comments:     Exercise Comments    Row Name 04/03/16 1452           Exercise Comments Reviewed METs and goals. Pt is tolerating  exercsie well; will continue to monitor exercise progression.          Discharge Exercise Prescription (Final Exercise Prescription Changes):     Exercise Prescription Changes - 04/15/16 1000      Response to Exercise   Blood Pressure (Admit) 124/60   Blood Pressure (Exercise) 124/70   Blood Pressure (Exit) 110/62   Heart Rate (Admit) 97 bpm   Heart Rate (Exercise) 126 bpm   Heart Rate (Exit) 96 bpm   Rating of Perceived Exertion (Exercise) 15   Symptoms 0   Comments reviewed HEP on 04/02/16.. See progress note   Duration Progress to 30 minutes of continuous aerobic without signs/symptoms of physical distress   Intensity THRR unchanged     Progression   Progression Continue progressive overload as per policy without signs/symptoms or physical distress.   Average METs 3  Resistance Training   Training Prescription Yes   Weight 2lbs   Reps 10-12     Treadmill   MPH 2.5   Grade 1   Minutes 10   METs 3.26     Bike   Level 1   Minutes 10   METs 3.47     NuStep   Level 3   Minutes 10   METs 2.3     Home Exercise Plan   Plans to continue exercise at Home  see progress note. Plans to walk for exercise   Frequency Add 2 additional days to program exercise sessions.      Nutrition:  Target Goals: Understanding of nutrition guidelines, daily intake of sodium '1500mg'$ , cholesterol '200mg'$ , calories 30% from fat and 7% or less from saturated fats, daily to have 5 or more servings of fruits and vegetables.  Biometrics:     Pre Biometrics - 03/04/16 1353      Pre Biometrics   Height 5' 1.5" (1.562 m)   Weight 169 lb 1.5 oz (76.7 kg)   Waist Circumference 36.75 inches   Hip Circumference 44 inches   Waist to Hip Ratio 0.84 %   BMI (Calculated) 31.5   Triceps Skinfold 40 mm   % Body Fat 42.7 %   Grip Strength 26 kg   Flexibility 9.75 in   Single Leg Stand 26.31 seconds       Nutrition Therapy Plan and Nutrition Goals:     Nutrition Therapy & Goals -  03/19/16 1337      Nutrition Therapy   Diet Carb Modified, Therapeutic Lifestyle Changes     Personal Nutrition Goals   Personal Goal #1 1-2 lb wt loss/week to a wt loss goal of 6-24 lb at graduation from [place], educate and counsel regarding individualized specific dietary modifications aiming towards targeted core components such as weight, hypertension, lipid management, diabetes, heart failure and other comorbidities.   Expected Outcomes Long Term Goal: Adherence to prescribed nutrition plan.      Nutrition Discharge: Nutrition Scores:   Nutrition Goals Re-Evaluation:   Psychosocial: Target Goals: Acknowledge presence or absence of depression, maximize coping skills, provide positive support system. Participant is able to verbalize types and ability to use techniques and skills needed for reducing stress and depression.  Initial Review & Psychosocial Screening:     Initial Psych Review & Screening - 03/05/16 1215      Family Dynamics   Good Support System? Yes     Barriers   Psychosocial barriers to participate in program The patient should benefit from training in stress management and relaxation.     Screening Interventions   Interventions Encouraged to exercise      Quality of Life Scores:     Quality of Life - 03/04/16 1355      Quality of Life Scores   Health/Function Pre 21.04 %   Socioeconomic Pre 27.43 %   Psych/Spiritual Pre 27.43 %   Family Pre 18 %   GLOBAL Pre 23.63 %      PHQ-9: Recent Review Flowsheet Data    Depression screen [facility] 2/9 03/24/2016   Decreased Interest 0   Down, Depressed, Hopeless 0   PHQ - 2 Score 0      Psychosocial Evaluation and Intervention:     Psychosocial Evaluation - 03/24/16 0943      Psychosocial Evaluation & Interventions   Interventions Encouraged to exercise with the program  and follow exercise prescription   Comments no psychosocial needs  identified, no intervention necessary    Continued Psychosocial Services Needed No      Psychosocial Re-Evaluation:     Psychosocial Re-Evaluation    Row Name 04/15/16 0759             Psychosocial Re-Evaluation   Interventions Encouraged to attend Cardiac Rehabilitation for the exercise       Comments no psychosocial needs identified, no interventions necessary. pt enjoys making chrismons at her church and is currently participating in light exercise at home.        Continued Psychosocial Services Needed No          Vocational Rehabilitation: Provide vocational rehab assistance to qualifying candidates.   Vocational Rehab Evaluation & Intervention:     Vocational Rehab - 03/05/16 1215      Initial Vocational Rehab Evaluation & Intervention   Assessment shows need for Vocational Rehabilitation No      Education: Education Goals: Education classes will be provided on a weekly basis, covering required topics. Participant will state understanding/return demonstration of topics presented.  Learning Barriers/Preferences:     Learning Barriers/Preferences - 03/04/16 1112      Learning Barriers/Preferences   Learning Barriers None   Learning Preferences Verbal Instruction;Written Material;Video;Skilled Demonstration;Group Instruction      Education Topics: Count Your Pulse:  -Group instruction provided by verbal instruction, demonstration, patient participation and written materials to support subject.  Instructors address importance of being able to find your pulse and how to count your pulse when at home without a heart monitor.  Patients get hands on experience counting their pulse with staff help and individually.   Heart Attack, Angina, and Risk Factor Modification:  -Group instruction provided by verbal instruction, video, and written materials to support subject.  Instructors address signs and symptoms of angina and heart attacks.    Also discuss risk factors  for heart disease and how to make changes to improve heart health risk factors.   Functional Fitness:  -Group instruction provided by verbal instruction, demonstration, patient participation, and written materials to support subject.  Instructors address safety measures for doing things around the house.  Discuss how to get up and down off the floor, how to pick things up properly, how to safely get out of a chair without assistance, and balance training. Flowsheet Row CARDIAC REHAB PHASE II EXERCISE from 04/04/2016 in [facility] CARDIAC REHAB  Date  04/04/16  Educator  Rosine Door, MS  Instruction Review Code  2- meets goals/outcomes      Meditation and Mindfulness:  -Group instruction provided by verbal instruction, patient participation, and written materials to support subject.  Instructor addresses importance of mindfulness and meditation practice to help reduce stress and improve awareness.  Instructor also leads participants through a meditation exercise.    Stretching for Flexibility and Mobility:  -Group instruction provided by verbal instruction, patient participation, and written materials to support subject.  Instructors lead participants through series of stretches that are designed to increase flexibility thus improving mobility.  These stretches are additional exercise for major muscle groups that are typically performed during regular warm up and cool down.   Hands Only CPR Anytime:  -Group instruction provided by verbal instruction, video, patient participation and written materials to support subject.  Instructors co-teach with AHA video for hands only CPR.  Participants get hands on experience with mannequins.   Nutrition I class: Heart Healthy Eating:  -Group instruction  provided by PowerPoint slides, verbal discussion, and written materials to support subject matter. The instructor gives an explanation and review of the Therapeutic Lifestyle  Changes diet recommendations, which includes a discussion on lipid goals, dietary fat, sodium, fiber, plant stanol/sterol esters, sugar, and the components of a well-balanced, healthy diet.   Nutrition II class: Lifestyle Skills:  -Group instruction provided by PowerPoint slides, verbal discussion, and written materials to support subject matter. The instructor gives an explanation and review of label reading, grocery shopping for heart health, heart healthy recipe modifications, and ways to make healthier choices when eating out.   Diabetes Question & Answer:  -Group instruction provided by PowerPoint slides, verbal discussion, and written materials to support subject matter. The instructor gives an explanation and review of diabetes co-morbidities, pre- and post-prandial blood glucose goals, pre-exercise blood glucose goals, signs, symptoms, and treatment of hypoglycemia and hyperglycemia, and foot care basics. Flowsheet Row CARDIAC REHAB PHASE II EXERCISE from 04/04/2016 in [place]  Date  03/28/16  Educator  RD  Instruction Review Code  2- meets goals/outcomes      Diabetes Blitz:  -Group instruction provided by PowerPoint slides, verbal discussion, and written materials to support subject matter. The instructor gives an explanation and review of the physiology behind type 1 and type 2 diabetes, diabetes medications and rational behind using different medications, pre- and post-prandial blood glucose recommendations and Hemoglobin A1c goals, diabetes diet, and exercise including blood glucose guidelines for exercising safely.    Portion Distortion:  -Group instruction provided by PowerPoint slides, verbal discussion, written materials, and food models to support subject matter. The instructor gives an explanation of serving size versus portion size, changes in portions sizes over the last 20 years, and what consists of a serving from each food  group.   Stress Management:  -Group instruction provided by verbal instruction, video, and written materials to support subject matter.  Instructors review role of stress in heart disease and how to cope with stress positively.     Exercising on Your Own:  -Group instruction provided by verbal instruction, power point, and written materials to support subject.  Instructors discuss benefits of exercise, components of exercise, frequency and intensity of exercise, and end points for exercise.  Also discuss use of nitroglycerin and activating EMS.  Review options of places to exercise outside of rehab.  Review guidelines for sex with heart disease.   Cardiac Drugs I:  -Group instruction provided by verbal instruction and written materials to support subject.  Instructor reviews cardiac drug classes: antiplatelets, anticoagulants, beta blockers, and statins.  Instructor discusses reasons, side effects, and lifestyle considerations for each drug class. Flowsheet Row CARDIAC REHAB PHASE II EXERCISE from 04/04/2016 in [place]  Date  03/26/16  Instruction Review Code  2- meets goals/outcomes      Cardiac Drugs II:  -Group instruction provided by verbal instruction and written materials to support subject.  Instructor reviews cardiac drug classes: angiotensin converting enzyme inhibitors (ACE-I), angiotensin II receptor blockers (ARBs), nitrates, and calcium channel blockers.  Instructor discusses reasons, side effects, and lifestyle considerations for each drug class.   Anatomy and Physiology of the Circulatory System:  -Group instruction provided by verbal instruction, video, and written materials to support subject.  Reviews functional anatomy of heart, how it relates to various diagnoses, and what role the heart plays in the overall system. Flowsheet Row CARDIAC REHAB PHASE II EXERCISE from 04/04/2016 in [facility]  CARDIAC REHAB  Date   04/02/16  Instruction Review Code  2- meets goals/outcomes      Knowledge Questionnaire Score:     Knowledge Questionnaire Score - 03/19/16 1336      Knowledge Questionnaire Score   Pre Score 23/24  DM 14/15      Core Components/Risk Factors/Patient Goals at Admission:     Personal Goals and Risk Factors at Admission - 03/04/16 1414      Core Components/Risk Factors/Patient Goals on Admission   Increase Strength and Stamina Yes   Intervention Provide advice, education, support and counseling about physical activity/exercise needs.;Develop an individualized exercise prescription for aerobic and resistive training based on initial evaluation findings, risk stratification, comorbidities and participant's personal goals.   Expected Outcomes Achievement of increased cardiorespiratory fitness and enhanced flexibility, muscular endurance and strength shown through measurements of functional capacity and personal statement of participant.   Tobacco Cessation Yes   Intervention Assist the participant in steps to quit. Provide individualized education and counseling about committing to Tobacco Cessation, relapse prevention, and pharmacological support that can be provided by physician.;Advice worker, assist with locating and accessing local/national Quit Smoking programs, and support quit date choice.   Expected Outcomes Short Term: Will demonstrate readiness to quit, by selecting a quit date.;Short Term: Will quit all tobacco product use, adhering to prevention of relapse plan.;Long Term: Complete abstinence from all tobacco products for at least 12 months from quit date.   Personal Goal Other Yes   Personal Goal short: increase fitness levels and decrease aches and pains with fitness. Long: increase ROM and flexibility   Intervention Provide exercise programming to assist with improving cardiovascular fitness, improve ROM and flexibility   Expected Outcomes Pt will be able to  develop an exercise routine, improve flexibility and ROM      Core Components/Risk Factors/Patient Goals Review:      Goals and Risk Factor Review    Row Name 04/03/16 1453             Core Components/Risk Factors/Patient Goals Review   Personal Goals Review Other;Increase Strength and Stamina       Review Reviewed HEP with patient. Pt plans to walk 2x/week in addition to coming to [facility]       Expected Outcomes Pt will be able to exercise at home independently and without difficulty.          Core Components/Risk Factors/Patient Goals at Discharge (Final Review):      Goals and Risk Factor Review - 04/03/16 1453      Core Components/Risk Factors/Patient Goals Review   Personal Goals Review Other;Increase Strength and Stamina   Review Reviewed HEP with patient. Pt plans to walk 2x/week in addition to coming to [facility]   Expected Outcomes Pt will be able to exercise at home independently and without difficulty.      ITP Comments:     ITP Comments    Row Name 03/04/16 1112 04/11/16 0825         ITP Comments Dr. Golden Hurter, Medical Director  Attended CPR education class, Met outcomes/goals          Comments: Pt is making expected progress toward personal goals after completing 8 sessions. Recommend continued exercise and life style modification education including  stress management and relaxation techniques to decrease cardiac risk profile.

## 2016-04-18 ENCOUNTER — Telehealth (HOSPITAL_COMMUNITY): Payer: Self-pay | Admitting: Internal Medicine

## 2016-04-18 ENCOUNTER — Encounter (HOSPITAL_COMMUNITY): Payer: BC Managed Care – PPO

## 2016-04-21 ENCOUNTER — Encounter (HOSPITAL_COMMUNITY)
Admission: RE | Admit: 2016-04-21 | Discharge: 2016-04-21 | Disposition: A | Discharge status: Home / Self Care | Payer: BC Managed Care – PPO | Source: Ambulatory Visit | Attending: Internal Medicine | Admitting: Internal Medicine

## 2016-04-21 DIAGNOSIS — R079 Chest pain, unspecified: Secondary | ICD-10-CM | POA: Diagnosis not present

## 2016-04-21 DIAGNOSIS — I208 Other forms of angina pectoris: Secondary | ICD-10-CM

## 2016-04-23 ENCOUNTER — Encounter (HOSPITAL_COMMUNITY)
Admission: RE | Admit: 2016-04-23 | Discharge: 2016-04-23 | Disposition: A | Discharge status: Home / Self Care | Payer: BC Managed Care – PPO | Source: Ambulatory Visit | Attending: Internal Medicine | Admitting: Internal Medicine

## 2016-04-23 DIAGNOSIS — I208 Other forms of angina pectoris: Secondary | ICD-10-CM

## 2016-04-23 DIAGNOSIS — R079 Chest pain, unspecified: Secondary | ICD-10-CM | POA: Diagnosis not present

## 2016-04-25 ENCOUNTER — Encounter (HOSPITAL_COMMUNITY)
Admission: RE | Admit: 2016-04-25 | Discharge: 2016-04-25 | Disposition: A | Discharge status: Home / Self Care | Payer: BC Managed Care – PPO | Source: Ambulatory Visit | Attending: Internal Medicine | Admitting: Internal Medicine

## 2016-04-25 DIAGNOSIS — I208 Other forms of angina pectoris: Secondary | ICD-10-CM

## 2016-04-25 DIAGNOSIS — R079 Chest pain, unspecified: Secondary | ICD-10-CM | POA: Diagnosis not present

## 2016-04-28 ENCOUNTER — Encounter (HOSPITAL_COMMUNITY)
Admission: RE | Admit: 2016-04-28 | Discharge: 2016-04-28 | Disposition: A | Discharge status: Home / Self Care | Payer: BC Managed Care – PPO | Source: Ambulatory Visit | Attending: Internal Medicine | Admitting: Internal Medicine

## 2016-04-28 DIAGNOSIS — I208 Other forms of angina pectoris: Secondary | ICD-10-CM

## 2016-04-28 DIAGNOSIS — R079 Chest pain, unspecified: Secondary | ICD-10-CM | POA: Diagnosis not present

## 2016-04-29 NOTE — Progress Notes (Signed)
Jocelyn [name] 51 y.o. female Nutrition Note Spoke with pt. Nutrition Plan and Nutrition Survey goals reviewed with pt. Pt is following Step 1  of the Therapeutic Lifestyle Changes diet according to [employer] results. Pt is an RD and wants to lose wt. Wt loss tips briefly reviewed.  Pt is diabetic. No recent A1c noted. Pt reports her last A1c was 7.6 "which is good for me." Pt checks CBG's "all the time because I have to calibrate my insulin pump frequently." This writer went over Diabetes Education test results. Pt expressed understanding of the information reviewed. Pt aware of nutrition education classes offered and has attended a couple of nutrition classes.  No results found for: HGBA1C  Nutrition Diagnosis ? Food-and nutrition-related knowledge deficit related to lack of exposure to information as related to diagnosis of: ? CVD ? DM ? Obesity related to excessive energy intake as evidenced by a BMI of 31.5  Nutrition Intervention ? Pt's individual nutrition plan reviewed with pt. ? Benefits of adopting Therapeutic Lifestyle Changes discussed when [employer] reviewed. ? Pt to attend the Portion Distortion class - met; 04/16/16 ? Pt to attend the Diabetes Q & A class - met; 04/25/16 ? Pt to attend the   ? Nutrition I class                     ? Nutrition II class - met; 04/22/16     ? Diabetes Blitz class - met; 04/29/16  ? Pt given handouts for: Diabetes Blitz Class  ? Continue client-centered nutrition education by RD, as part of interdisciplinary care. Goal(s) ? Pt to identify food quantities necessary to achieve weight loss of 6-24 lb (2.7-10.9 kg) at graduation from cardiac rehab.  ? Use pre-meal and post-meal CBG's and A1c to determine whether adjustments in food/meal planning will be beneficial or if any meds need to be combined with nutrition therapy. Monitor and Evaluate progress toward nutrition goal with team. Derek Mound, M.Ed, RD, LDN, CDE 04/29/2016 3:13 PM

## 2016-05-05 ENCOUNTER — Encounter (HOSPITAL_COMMUNITY)
Admission: RE | Admit: 2016-05-05 | Discharge: 2016-05-05 | Disposition: A | Discharge status: Home / Self Care | Payer: BC Managed Care – PPO | Source: Ambulatory Visit | Attending: Internal Medicine | Admitting: Internal Medicine

## 2016-05-05 DIAGNOSIS — I208 Other forms of angina pectoris: Secondary | ICD-10-CM

## 2016-05-05 DIAGNOSIS — R079 Chest pain, unspecified: Secondary | ICD-10-CM | POA: Diagnosis not present

## 2016-05-07 ENCOUNTER — Encounter (HOSPITAL_COMMUNITY)
Admission: RE | Admit: 2016-05-07 | Discharge: 2016-05-07 | Disposition: A | Discharge status: Home / Self Care | Payer: BC Managed Care – PPO | Source: Ambulatory Visit | Attending: Internal Medicine | Admitting: Internal Medicine

## 2016-05-07 DIAGNOSIS — R079 Chest pain, unspecified: Secondary | ICD-10-CM | POA: Diagnosis not present

## 2016-05-07 DIAGNOSIS — I208 Other forms of angina pectoris: Secondary | ICD-10-CM

## 2016-05-09 ENCOUNTER — Encounter (HOSPITAL_COMMUNITY)
Admission: RE | Admit: 2016-05-09 | Discharge: 2016-05-09 | Disposition: A | Discharge status: Home / Self Care | Payer: BC Managed Care – PPO | Source: Ambulatory Visit | Attending: Internal Medicine | Admitting: Internal Medicine

## 2016-05-09 DIAGNOSIS — I208 Other forms of angina pectoris: Secondary | ICD-10-CM

## 2016-05-09 DIAGNOSIS — R079 Chest pain, unspecified: Secondary | ICD-10-CM | POA: Diagnosis not present

## 2016-05-12 ENCOUNTER — Encounter (HOSPITAL_COMMUNITY)
Admission: RE | Admit: 2016-05-12 | Discharge: 2016-05-12 | Disposition: A | Discharge status: Home / Self Care | Payer: BC Managed Care – PPO | Source: Ambulatory Visit | Attending: Internal Medicine | Admitting: Internal Medicine

## 2016-05-12 DIAGNOSIS — I208 Other forms of angina pectoris: Secondary | ICD-10-CM

## 2016-05-12 DIAGNOSIS — R079 Chest pain, unspecified: Secondary | ICD-10-CM | POA: Diagnosis not present

## 2016-05-14 ENCOUNTER — Encounter (HOSPITAL_COMMUNITY)
Admission: RE | Admit: 2016-05-14 | Discharge: 2016-05-14 | Disposition: A | Discharge status: Home / Self Care | Payer: BC Managed Care – PPO | Source: Ambulatory Visit | Attending: Internal Medicine | Admitting: Internal Medicine

## 2016-05-14 DIAGNOSIS — I208 Other forms of angina pectoris: Secondary | ICD-10-CM

## 2016-05-14 DIAGNOSIS — R079 Chest pain, unspecified: Secondary | ICD-10-CM | POA: Diagnosis not present

## 2016-05-14 DIAGNOSIS — I2089 Other forms of angina pectoris: Secondary | ICD-10-CM

## 2016-05-14 NOTE — Progress Notes (Signed)
Cardiac Individual Treatment Plan  Patient Details  Name: Jocelyn [name] MRN: [id number] Date of Birth: 1965/10/22 Referring Provider:   Flowsheet Row CARDIAC REHAB PHASE II ORIENTATION from 03/04/2016 in [place]  Referring Provider  Cristopher Peru, MD.      Initial Encounter Date:  Flowsheet Row CARDIAC REHAB PHASE II ORIENTATION from 03/04/2016 in [place]  Date  03/04/16  Referring Provider  Cristopher Peru, MD.      Visit Diagnosis: Chronic stable angina [facility])  Patient's Home Medications on Admission:  Current Outpatient Prescriptions:  .  Ascorbic Acid (VITAMIN C) 500 MG tablet, Take 500 mg by mouth daily.  , Disp: , Rfl:  .  aspirin 81 MG tablet, Take 81 mg by mouth at bedtime. , Disp: , Rfl:  .  Calcium Carbonate (CALCIUM 500 PO), Take 1,000 mg by mouth daily. , Disp: , Rfl:  .  cetirizine (ZYRTEC) 10 MG tablet, Take 10 mg by mouth at bedtime. , Disp: , Rfl:  .  cholecalciferol (VITAMIN D) 1000 units tablet, Take 1,000 Units by mouth daily., Disp: , Rfl:  .  Coenzyme Q10 (CO Q 10) 100 MG CAPS, Take 100 mg by mouth daily. , Disp: , Rfl:  .  Cyanocobalamin (VITAMIN B 12) 250 MCG LOZG, Take 1 tablet by mouth daily. , Disp: , Rfl:  .  gabapentin (NEURONTIN) 100 MG capsule, Take 1 capsule by mouth daily., Disp: , Rfl:  .  Insulin Infusion Pump KIT, as directed. , Disp: , Rfl:  .  MAGNESIUM PO, Take 1 tablet by mouth daily. , Disp: , Rfl:  .  Multiple Vitamin (MULTIVITAMIN) tablet, Take 1 tablet by mouth daily.  , Disp: , Rfl:  .  nitroGLYCERIN (NITROSTAT) 0.4 MG SL tablet, Place 1 tablet (0.4 mg total) under the tongue every 5 (five) minutes as needed for chest pain., Disp: 30 tablet, Rfl: 12 .  NOVOLOG 100 UNIT/ML injection, as directed., Disp: , Rfl:  .  pantoprazole (PROTONIX) 40 MG tablet, Take 40 mg by mouth See admin instructions. Takes 40 mg every morning, can take a second 40 mg if needed for acid, Disp: ,  Rfl:  .  Pitavastatin Calcium (LIVALO) 2 MG TABS, Take 2 mg by mouth at bedtime. , Disp: , Rfl:  .  ramipril (ALTACE) 2.5 MG capsule, Take 2.5 mg by mouth at bedtime., Disp: , Rfl:  .  sertraline (ZOLOFT) 100 MG tablet, Take 100 mg by mouth daily before breakfast., Disp: , Rfl:  .  verapamil (CALAN-SR) 120 MG CR tablet, Take 1 tablet (120 mg total) by mouth at bedtime., Disp: 30 tablet, Rfl: 30 .  ZETIA 10 MG tablet, TAKE 1 TAB (10 mg) BY MOUTH ONCE DAILY AT BEDTIME, Disp: , Rfl: 3  Past Medical History: Past Medical History:  Diagnosis Date  . Anxiety   . Chest pain    RECURRENT WITH NEGITIVE CATHERIZATIONS  . Complication of anesthesia    aspirated and "gained 15pounds, ended up in ICU"  . DM (diabetes mellitus) ([place])   . Dyslipidemia   . Fatty liver   . GERD (gastroesophageal reflux disease)   . History of CT scan    Coronary CTA 3/17: Calcium score 0; no plaque or stenosis noted in coronary arteries  . Hypercholesteremia   . Hypertension   . Migraine   . Obesity   . Pulmonary embolus ([place]) 2002  . Tachycardia    A fib  Tobacco Use: History  Smoking Status  . Never Smoker  Smokeless Tobacco  . Never Used    Labs: Recent Review Flowsheet Data    Labs for ITP Cardiac and Pulmonary Rehab Latest Ref Rng & Units 04/05/2015   Cholestrol 0 - 200 mg/dL 107   LDLCALC 0 - 99 mg/dL 33   HDL >40 mg/dL 54   Trlycerides <150 mg/dL 101      Capillary Blood Glucose: Lab Results  Component Value Date   GLUCAP 214 (H) 03/24/2016   GLUCAP 181 (H) 12/23/2015   GLUCAP 131 (H) 12/23/2015   GLUCAP 151 (H) 12/23/2015   GLUCAP 152 (H) 12/22/2015     Exercise Target Goals:    Exercise Program Goal: Individual exercise prescription set with THRR, safety & activity barriers. Participant demonstrates ability to understand and report RPE using BORG scale, to self-measure pulse accurately, and to acknowledge the importance of the exercise prescription.  Exercise Prescription  Goal: Starting with aerobic activity 30 plus minutes a day, 3 days per week for initial exercise prescription. Provide home exercise prescription and guidelines that participant acknowledges understanding prior to discharge.  Activity Barriers & Risk Stratification:     Activity Barriers & Cardiac Risk Stratification - 03/04/16 1411      Activity Barriers & Cardiac Risk Stratification   Activity Barriers Deconditioning;Muscular Weakness;None;Shortness of Breath;Back Problems   Cardiac Risk Stratification Moderate      6 Minute Walk:     6 Minute Walk    Row Name 03/04/16 1354 03/04/16 1355       6 Minute Walk   Phase Initial Initial    Distance 1756 feet 1756 feet    Walk Time 6 minutes 6 minutes    # of Rest Breaks 0 0    MPH 3.33 3.32    METS 4.69 4.69    RPE 12 12    VO2 Peak 16.43 16.4    Symptoms Yes (comment) Yes (comment)  4/10 chest discomfort    Comments Patient c/o chest discomfort "4/10" on the pain scale.  -    Resting HR 100 bpm 102 bpm    Resting BP 108/66 108/66    Max Ex. HR 130 bpm 130 bpm    Max Ex. BP 128/74 128/74       Initial Exercise Prescription:     Initial Exercise Prescription - 03/04/16 1600      Date of Initial Exercise RX and Referring Provider   Date 03/04/16   Referring Provider Cristopher Peru, MD.     Treadmill   MPH 2.5   Grade 1   Minutes 10   METs 3.26     Bike   Level 1   Minutes 10   METs 3.52     NuStep   Level 3   Minutes 10   METs 2     Prescription Details   Frequency (times per week) 3   Duration Progress to 30 minutes of continuous aerobic without signs/symptoms of physical distress     Intensity   THRR 40-80% of Max Heartrate 68-135   Ratings of Perceived Exertion 11-13   Perceived Dyspnea 0-4     Progression   Progression Continue to progress workloads to maintain intensity without signs/symptoms of physical distress.     Resistance Training   Training Prescription Yes   Weight 2lbs   Reps  10-12      Perform Capillary Blood Glucose checks as needed.  Exercise Prescription Changes:  Exercise Prescription Changes    Row Name 04/03/16 1400 04/15/16 1000 05/12/16 1600         Exercise Review   Progression Yes  - Yes       Response to Exercise   Blood Pressure (Admit) 118/60 124/60 104/60     Blood Pressure (Exercise) 124/60 124/70 128/60     Blood Pressure (Exit) 114/60 110/62 106/62     Heart Rate (Admit) 97 bpm 97 bpm 100 bpm     Heart Rate (Exercise) 121 bpm 126 bpm 122 bpm     Heart Rate (Exit) 88 bpm 96 bpm 82 bpm     Rating of Perceived Exertion (Exercise) 13 15 14     Symptoms 0 0 0     Comments reviewed HEP on 04/02/16.. See progress note reviewed HEP on 04/02/16.. See progress note reviewed HEP on 04/02/16.. See progress note     Duration Progress to 30 minutes of continuous aerobic without signs/symptoms of physical distress Progress to 30 minutes of continuous aerobic without signs/symptoms of physical distress Progress to 30 minutes of continuous aerobic without signs/symptoms of physical distress     Intensity THRR unchanged THRR unchanged THRR unchanged       Progression   Progression Continue progressive overload as per policy without signs/symptoms or physical distress. Continue progressive overload as per policy without signs/symptoms or physical distress. Continue progressive overload as per policy without signs/symptoms or physical distress.     Average METs 3.1 3 3.3       Resistance Training   Training Prescription Yes Yes Yes     Weight 2lbs 2lbs 4lbs     Reps 10-12 10-12 10-12       Treadmill   MPH 2.5 2.5 2.5     Grade 1 1 1     Minutes 10 10 10     METs 3.26 3.26 3.26       Bike   Level 1 1 1     Minutes 10 10 10     METs 3.47 3.47 3.47       NuStep   Level 3 3 4     Minutes 10 10 10     METs 2.6 2.3 3.1       Home Exercise Plan   Plans to continue exercise at Home  see progress note. Plans to walk for exercise Home  see  progress note. Plans to walk for exercise Home  see progress note. Plans to walk for exercise     Frequency Add 2 additional days to program exercise sessions. Add 2 additional days to program exercise sessions. Add 2 additional days to program exercise sessions.        Exercise Comments:     Exercise Comments    Row Name 04/03/16 1452 05/12/16 1622         Exercise Comments Reviewed METs and goals. Pt is tolerating exercsie well; will continue to monitor exercise progression. Reviewed METs and goals. Pt is tolerating exercsie well; will continue to monitor exercise progression.         Discharge Exercise Prescription (Final Exercise Prescription Changes):     Exercise Prescription Changes - 05/12/16 1600      Exercise Review   Progression Yes     Response to Exercise   Blood Pressure (Admit) 104/60   Blood Pressure (Exercise) 128/60   Blood Pressure (Exit) 106/62   Heart Rate (Admit) 100 bpm   Heart Rate (Exercise) 122 bpm     Heart Rate (Exit) 82 bpm   Rating of Perceived Exertion (Exercise) 14   Symptoms 0   Comments reviewed HEP on 04/02/16.. See progress note   Duration Progress to 30 minutes of continuous aerobic without signs/symptoms of physical distress   Intensity THRR unchanged     Progression   Progression Continue progressive overload as per policy without signs/symptoms or physical distress.   Average METs 3.3     Resistance Training   Training Prescription Yes   Weight 4lbs   Reps 10-12     Treadmill   MPH 2.5   Grade 1   Minutes 10   METs 3.26     Bike   Level 1   Minutes 10   METs 3.47     NuStep   Level 4   Minutes 10   METs 3.1     Home Exercise Plan   Plans to continue exercise at Home  see progress note. Plans to walk for exercise   Frequency Add 2 additional days to program exercise sessions.      Nutrition:  Target Goals: Understanding of nutrition guidelines, daily intake of sodium <1539m, cholesterol <2030m calories 30%  from fat and 7% or less from saturated fats, daily to have 5 or more servings of fruits and vegetables.  Biometrics:     Pre Biometrics - 03/04/16 1353      Pre Biometrics   Height 5' 1.5" (1.562 m)   Weight 169 lb 1.5 oz (76.7 kg)   Waist Circumference 36.75 inches   Hip Circumference 44 inches   Waist to Hip Ratio 0.84 %   BMI (Calculated) 31.5   Triceps Skinfold 40 mm   % Body Fat 42.7 %   Grip Strength 26 kg   Flexibility 9.75 in   Single Leg Stand 26.31 seconds       Nutrition Therapy Plan and Nutrition Goals:     Nutrition Therapy & Goals - 03/19/16 1337      Nutrition Therapy   Diet Carb Modified, Therapeutic Lifestyle Changes     Personal Nutrition Goals   Personal Goal #1 1-2 lb wt loss/week to a wt loss goal of 6-24 lb [facility] regarding individualized specific dietary modifications aiming towards targeted core components such as weight, hypertension, lipid management, diabetes, heart failure and other comorbidities.   Expected Outcomes Long Term Goal: Adherence to prescribed nutrition plan.      Nutrition Discharge: Nutrition Scores:   Nutrition Goals Re-Evaluation:   Psychosocial: Target Goals: Acknowledge presence or absence of depression, maximize coping skills, provide positive support system. Participant is able to verbalize types and ability to use techniques and skills needed for reducing stress and depression.  Initial Review & Psychosocial Screening:     Initial Psych Review & Screening - 03/05/16 1215      Family Dynamics   Good Support System? Yes     Barriers   Psychosocial barriers to participate in program The patient should benefit from training in stress management and relaxation.     Screening Interventions   Interventions Encouraged to exercise      Quality of Life Scores:     Quality of Life - 03/04/16 1355      Quality of Life  Scores   Health/Function Pre 21.04 %   Socioeconomic Pre 27.43 %   Psych/Spiritual Pre 27.43 %   Family Pre 18 %  GLOBAL Pre 23.63 %      PHQ-9: Recent Review Flowsheet Data    Depression screen PHQ 2/9 03/24/2016   Decreased Interest 0   Down, Depressed, Hopeless 0   PHQ - 2 Score 0      Psychosocial Evaluation and Intervention:     Psychosocial Evaluation - 03/24/16 0943      Psychosocial Evaluation & Interventions   Interventions Encouraged to exercise with the program and follow exercise prescription   Comments no psychosocial needs identified, no intervention necessary    Continued Psychosocial Services Needed No      Psychosocial Re-Evaluation:     Psychosocial Re-Evaluation    Row Name 04/15/16 0759 05/12/16 0725           Psychosocial Re-Evaluation   Interventions Encouraged to attend Cardiac Rehabilitation for the exercise Encouraged to attend Cardiac Rehabilitation for the exercise      Comments no psychosocial needs identified, no interventions necessary. pt enjoys making chrismons at her church and is currently participating in light exercise at home.  no psychosocial needs identified, no interventions necessary.  pt has been walking for home exercise and insprires her dad to walk with her.  pt is interested in participating in cardiac maintenance program.       Continued Psychosocial Services Needed No No         Vocational Rehabilitation: Provide vocational rehab assistance to qualifying candidates.   Vocational Rehab Evaluation & Intervention:     Vocational Rehab - 03/05/16 1215      Initial Vocational Rehab Evaluation & Intervention   Assessment shows need for Vocational Rehabilitation No      Education: Education Goals: Education classes will be provided on a weekly basis, covering required topics. Participant will state understanding/return demonstration of topics presented.  Learning Barriers/Preferences:     Learning  Barriers/Preferences - 03/04/16 1112      Learning Barriers/Preferences   Learning Barriers None   Learning Preferences Verbal Instruction;Written Material;Video;Skilled Demonstration;Group Instruction      Education Topics: Count Your Pulse:  -Group instruction provided by verbal instruction, demonstration, patient participation and written materials to support subject.  Instructors address importance of being able to find your pulse and how to count your pulse when at home without a heart monitor.  Patients get hands on experience counting their pulse with staff help and individually.   Heart Attack, Angina, and Risk Factor Modification:  -Group instruction provided by verbal instruction, video, and written materials to support subject.  Instructors address signs and symptoms of angina and heart attacks.    Also discuss risk factors for heart disease and how to make changes to improve heart health risk factors. Flowsheet Row CARDIAC REHAB PHASE II EXERCISE from 05/14/2016 in [facility]  Date  05/07/16  Instruction Review Code  2- meets goals/outcomes      Functional Fitness:  -Group instruction provided by verbal instruction, demonstration, patient participation, and written materials to support subject.  Instructors address safety measures for doing things around the house.  Discuss how to get up and down off the floor, how to pick things up properly, how to safely get out of a chair without assistance, and balance training. Flowsheet Row CARDIAC REHAB PHASE II EXERCISE from 05/14/2016 in [facility]  Date  04/04/16  Educator  Ashley Armstrong, MS  Instruction Review Code  2- meets goals/outcomes      Meditation and Mindfulness:  -Group instruction provided by verbal   instruction, patient participation, and written materials to support subject.  Instructor addresses importance of mindfulness and meditation practice to help  reduce stress and improve awareness.  Instructor also leads participants through a meditation exercise.    Stretching for Flexibility and Mobility:  -Group instruction provided by verbal instruction, patient participation, and written materials to support subject.  Instructors lead participants through series of stretches that are designed to increase flexibility thus improving mobility.  These stretches are additional exercise for major muscle groups that are typically performed during regular warm up and cool down. Flowsheet Row CARDIAC REHAB PHASE II EXERCISE from 05/14/2016 in [facility]  Date  05/09/16  Instruction Review Code  2- meets goals/outcomes      Hands Only CPR Anytime:  -Group instruction provided by verbal instruction, video, patient participation and written materials to support subject.  Instructors co-teach with [facility] video for hands only CPR.  Participants get hands on experience with mannequins.   Nutrition I class: Heart Healthy Eating:  -Group instruction provided by PowerPoint slides, verbal discussion, and written materials to support subject matter. The instructor gives an explanation and review of the Therapeutic Lifestyle Changes diet recommendations, which includes a discussion on lipid goals, dietary fat, sodium, fiber, plant stanol/sterol esters, sugar, and the components of a well-balanced, healthy diet.   Nutrition II class: Lifestyle Skills:  -Group instruction provided by PowerPoint slides, verbal discussion, and written materials to support subject matter. The instructor gives an explanation and review of label reading, grocery shopping for heart health, heart healthy recipe modifications, and ways to make healthier choices when eating out. Flowsheet Row CARDIAC REHAB PHASE II EXERCISE from 05/14/2016 in [facility]  Date  04/22/16  Educator  RD  Instruction Review Code  2- meets goals/outcomes       Diabetes Question & Answer:  -Group instruction provided by PowerPoint slides, verbal discussion, and written materials to support subject matter. The instructor gives an explanation and review of diabetes co-morbidities, pre- and post-prandial blood glucose goals, pre-exercise blood glucose goals, signs, symptoms, and treatment of hypoglycemia and hyperglycemia, and foot care basics. Flowsheet Row CARDIAC REHAB PHASE II EXERCISE from 05/14/2016 in [facility]  Date  04/25/16  Educator  RD  Instruction Review Code  2- meets goals/outcomes      Diabetes Blitz:  -Group instruction provided by PowerPoint slides, verbal discussion, and written materials to support subject matter. The instructor gives an explanation and review of the physiology behind type 1 and type 2 diabetes, diabetes medications and rational behind using different medications, pre- and post-prandial blood glucose recommendations and Hemoglobin A1c goals, diabetes diet, and exercise including blood glucose guidelines for exercising safely.  Flowsheet Row CARDIAC REHAB PHASE II EXERCISE from 05/14/2016 in [facility]  Date  04/29/16  Educator  RD  Instruction Review Code  2- meets goals/outcomes      Portion Distortion:  -Group instruction provided by PowerPoint slides, verbal discussion, written materials, and food models to support subject matter. The instructor gives an explanation of serving size versus portion size, changes in portions sizes over the last 20 years, and what consists of a serving from each food group. Flowsheet Row CARDIAC REHAB PHASE II EXERCISE from 05/14/2016 in [facility]  Date  04/16/16  Educator  RD  Instruction Review Code  2- meets goals/outcomes      Stress Management:  -Group instruction   provided by verbal instruction, video, and written materials to support subject matter.  Instructors review role  of stress in heart disease and how to cope with stress positively.     Exercising on Your Own:  -Group instruction provided by verbal instruction, power point, and written materials to support subject.  Instructors discuss benefits of exercise, components of exercise, frequency and intensity of exercise, and end points for exercise.  Also discuss use of nitroglycerin and activating EMS.  Review options of places to exercise outside of rehab.  Review guidelines for sex with heart disease. Flowsheet Row CARDIAC REHAB PHASE II EXERCISE from 05/14/2016 in [place]  Date  05/14/16  Instruction Review Code  2- meets goals/outcomes      Cardiac Drugs I:  -Group instruction provided by verbal instruction and written materials to support subject.  Instructor reviews cardiac drug classes: antiplatelets, anticoagulants, beta blockers, and statins.  Instructor discusses reasons, side effects, and lifestyle considerations for each drug class. Flowsheet Row CARDIAC REHAB PHASE II EXERCISE from 05/14/2016 in [place]  Date  03/26/16  Instruction Review Code  2- meets goals/outcomes      Cardiac Drugs II:  -Group instruction provided by verbal instruction and written materials to support subject.  Instructor reviews cardiac drug classes: angiotensin converting enzyme inhibitors (ACE-I), angiotensin II receptor blockers (ARBs), nitrates, and calcium channel blockers.  Instructor discusses reasons, side effects, and lifestyle considerations for each drug class. Flowsheet Row CARDIAC REHAB PHASE II EXERCISE from 05/14/2016 in [place]  Date  04/23/16  Instruction Review Code  2- meets goals/outcomes      Anatomy and Physiology of the Circulatory System:  -Group instruction provided by verbal instruction, video, and written materials to support subject.  Reviews functional anatomy of heart, how it relates to  various diagnoses, and what role the heart plays in the overall system. Flowsheet Row CARDIAC REHAB PHASE II EXERCISE from 05/14/2016 in [place]  Date  04/02/16  Instruction Review Code  2- meets goals/outcomes      Knowledge Questionnaire Score:     Knowledge Questionnaire Score - 03/19/16 1336      Knowledge Questionnaire Score   Pre Score 23/24  DM 14/15      Core Components/Risk Factors/Patient Goals at Admission:     Personal Goals and Risk Factors at Admission - 03/04/16 1414      Core Components/Risk Factors/Patient Goals on Admission   Increase Strength and Stamina Yes   Intervention Provide advice, education, support and counseling about physical activity/exercise needs.;Develop an individualized exercise prescription for aerobic and resistive training based on initial evaluation findings, risk stratification, comorbidities and participant's personal goals.   Expected Outcomes Achievement of increased cardiorespiratory fitness and enhanced flexibility, muscular endurance and strength shown through measurements of functional capacity and personal statement of participant.   Tobacco Cessation Yes   Intervention Assist the participant in steps to quit. Provide individualized education and counseling about committing to Tobacco Cessation, relapse prevention, and pharmacological support that can be provided by physician.;Advice worker, assist with locating and accessing local/national Quit Smoking programs, and support quit date choice.   Expected Outcomes Short Term: Will demonstrate readiness to quit, by selecting a quit date.;Short Term: Will quit all tobacco product use, adhering to prevention of relapse plan.;Long Term: Complete abstinence from all tobacco products for at least 12 months from quit date.   Personal Goal Other Yes  Personal Goal short: increase fitness levels and decrease aches and pains with fitness. Long:  increase ROM and flexibility   Intervention Provide exercise programming to assist with improving cardiovascular fitness, improve ROM and flexibility   Expected Outcomes Pt will be able to develop an exercise routine, improve flexibility and ROM      Core Components/Risk Factors/Patient Goals Review:      Goals and Risk Factor Review    Row Name 04/03/16 1453 05/12/16 1622           Core Components/Risk Factors/Patient Goals Review   Personal Goals Review Other;Increase Strength and Stamina  -      Review Reviewed HEP with patient. Pt plans to walk 2x/week in addition to coming to [facility] Pt stated " improved flexibility and ROM throughtout" pt expressed interest in cardiac rehab maintenance for HEP after completed monitored program      Expected Outcomes Pt will be able to exercise at home independently and without difficulty. Pt will continue to benefit from stretching and warm up classes to improve flexibility/ROM and functional mobility. Pt will successfully complete cardiac rehab monitored and exercise safely in CR maintenance.         Core Components/Risk Factors/Patient Goals at Discharge (Final Review):      Goals and Risk Factor Review - 05/12/16 1622      Core Components/Risk Factors/Patient Goals Review   Review Pt stated " improved flexibility and ROM throughtout" pt expressed interest in cardiac rehab maintenance for HEP after completed monitored program   Expected Outcomes Pt will continue to benefit from stretching and warm up classes to improve flexibility/ROM and functional mobility. Pt will successfully complete cardiac rehab monitored and exercise safely in CR maintenance.      ITP Comments:     ITP Comments    Row Name 03/04/16 1112 04/11/16 0825 05/14/16 1127       ITP Comments Dr. Tracy Turner, Medical Director  Attended CPR education class, Met outcomes/goals  Dr. Traci Turner-Medical Director         Comments: Pt is making expected progress toward  personal goals after completing 18 sessions. Recommend continued exercise and life style modification education including  stress management and relaxation techniques to decrease cardiac risk profile.  

## 2016-05-16 ENCOUNTER — Encounter (HOSPITAL_COMMUNITY)
Admission: RE | Admit: 2016-05-16 | Discharge: 2016-05-16 | Disposition: A | Discharge status: Home / Self Care | Payer: BC Managed Care – PPO | Source: Ambulatory Visit | Attending: Internal Medicine | Admitting: Internal Medicine

## 2016-05-16 DIAGNOSIS — R079 Chest pain, unspecified: Secondary | ICD-10-CM | POA: Insufficient documentation

## 2016-05-16 DIAGNOSIS — I2089 Other forms of angina pectoris: Secondary | ICD-10-CM

## 2016-05-16 DIAGNOSIS — I208 Other forms of angina pectoris: Secondary | ICD-10-CM

## 2016-05-19 ENCOUNTER — Encounter (HOSPITAL_COMMUNITY)
Admission: RE | Admit: 2016-05-19 | Discharge: 2016-05-19 | Disposition: A | Discharge status: Home / Self Care | Payer: BC Managed Care – PPO | Source: Ambulatory Visit | Attending: Internal Medicine | Admitting: Internal Medicine

## 2016-05-19 DIAGNOSIS — R079 Chest pain, unspecified: Secondary | ICD-10-CM | POA: Diagnosis not present

## 2016-05-19 DIAGNOSIS — I2089 Other forms of angina pectoris: Secondary | ICD-10-CM

## 2016-05-19 DIAGNOSIS — I208 Other forms of angina pectoris: Secondary | ICD-10-CM

## 2016-05-21 ENCOUNTER — Encounter (HOSPITAL_COMMUNITY)
Admission: RE | Admit: 2016-05-21 | Discharge: 2016-05-21 | Disposition: A | Discharge status: Home / Self Care | Payer: BC Managed Care – PPO | Source: Ambulatory Visit | Attending: Internal Medicine | Admitting: Internal Medicine

## 2016-05-21 DIAGNOSIS — I208 Other forms of angina pectoris: Secondary | ICD-10-CM

## 2016-05-21 DIAGNOSIS — R079 Chest pain, unspecified: Secondary | ICD-10-CM | POA: Diagnosis not present

## 2016-05-23 ENCOUNTER — Encounter (HOSPITAL_COMMUNITY)
Admission: RE | Admit: 2016-05-23 | Discharge: 2016-05-23 | Disposition: A | Discharge status: Home / Self Care | Payer: BC Managed Care – PPO | Source: Ambulatory Visit | Attending: Internal Medicine | Admitting: Internal Medicine

## 2016-05-23 DIAGNOSIS — R079 Chest pain, unspecified: Secondary | ICD-10-CM | POA: Diagnosis not present

## 2016-05-23 DIAGNOSIS — I2089 Other forms of angina pectoris: Secondary | ICD-10-CM

## 2016-05-23 DIAGNOSIS — I208 Other forms of angina pectoris: Secondary | ICD-10-CM

## 2016-05-26 ENCOUNTER — Encounter (HOSPITAL_COMMUNITY)
Admission: RE | Admit: 2016-05-26 | Discharge: 2016-05-26 | Disposition: A | Discharge status: Home / Self Care | Payer: BC Managed Care – PPO | Source: Ambulatory Visit | Attending: Internal Medicine | Admitting: Internal Medicine

## 2016-05-26 DIAGNOSIS — I2089 Other forms of angina pectoris: Secondary | ICD-10-CM

## 2016-05-26 DIAGNOSIS — R079 Chest pain, unspecified: Secondary | ICD-10-CM | POA: Diagnosis not present

## 2016-05-26 DIAGNOSIS — I208 Other forms of angina pectoris: Secondary | ICD-10-CM

## 2016-05-28 ENCOUNTER — Encounter (HOSPITAL_COMMUNITY)
Admission: RE | Admit: 2016-05-28 | Discharge: 2016-05-28 | Disposition: A | Discharge status: Home / Self Care | Payer: BC Managed Care – PPO | Source: Ambulatory Visit | Attending: Internal Medicine | Admitting: Internal Medicine

## 2016-05-28 DIAGNOSIS — R079 Chest pain, unspecified: Secondary | ICD-10-CM | POA: Diagnosis not present

## 2016-05-28 DIAGNOSIS — I208 Other forms of angina pectoris: Secondary | ICD-10-CM

## 2016-05-30 ENCOUNTER — Encounter (HOSPITAL_COMMUNITY)
Admission: RE | Admit: 2016-05-30 | Discharge: 2016-05-30 | Disposition: A | Discharge status: Home / Self Care | Payer: BC Managed Care – PPO | Source: Ambulatory Visit | Attending: Internal Medicine | Admitting: Internal Medicine

## 2016-05-30 DIAGNOSIS — I208 Other forms of angina pectoris: Secondary | ICD-10-CM

## 2016-05-30 DIAGNOSIS — R079 Chest pain, unspecified: Secondary | ICD-10-CM | POA: Diagnosis not present

## 2016-06-02 ENCOUNTER — Ambulatory Visit (INDEPENDENT_AMBULATORY_CARE_PROVIDER_SITE_OTHER): Payer: BC Managed Care – PPO | Admitting: Neurology

## 2016-06-02 ENCOUNTER — Ambulatory Visit (INDEPENDENT_AMBULATORY_CARE_PROVIDER_SITE_OTHER): Payer: Self-pay | Admitting: Neurology

## 2016-06-02 ENCOUNTER — Encounter (HOSPITAL_COMMUNITY)
Admission: RE | Admit: 2016-06-02 | Discharge: 2016-06-02 | Disposition: A | Discharge status: Home / Self Care | Payer: BC Managed Care – PPO | Source: Ambulatory Visit | Attending: Internal Medicine | Admitting: Internal Medicine

## 2016-06-02 ENCOUNTER — Encounter: Payer: Self-pay | Admitting: Neurology

## 2016-06-02 DIAGNOSIS — G5603 Carpal tunnel syndrome, bilateral upper limbs: Secondary | ICD-10-CM | POA: Diagnosis not present

## 2016-06-02 DIAGNOSIS — R202 Paresthesia of skin: Secondary | ICD-10-CM

## 2016-06-02 DIAGNOSIS — R079 Chest pain, unspecified: Secondary | ICD-10-CM | POA: Diagnosis not present

## 2016-06-02 DIAGNOSIS — I208 Other forms of angina pectoris: Secondary | ICD-10-CM

## 2016-06-02 HISTORY — DX: Carpal tunnel syndrome, bilateral upper limbs: G56.03

## 2016-06-02 NOTE — Progress Notes (Signed)
Jocelyn [name] comes in today for EMG and nerve conduction study evaluation. The nerve conduction studies show evidence of bilateral carpal tunnel syndrome, otherwise a peripheral neuropathy cannot be confirmed. EMG of the left leg was unremarkable. The patient has sensory complaints on all 4 extremities, a small fiber neuropathy may be missed by a standard nerve conduction study evaluations.  The EMG and nerve conduction study did not explain the numbness and paresthesias in the legs. The patient will be set up for blood work today. She will follow-up in about 4 months.  The patient already has wrist splints, but she has not been wearing them regularly, she is to wear them as much she can a night for the next 2 months. If the symptoms are not improving, she will contact our office and we will make a referral to a hand surgeon.

## 2016-06-02 NOTE — Procedures (Signed)
     HISTORY:  Jocelyn [name] is a 51 year old patient with a history of type 1 diabetes who reports paresthesias on all 4 extremities. The patient is being evaluated for a possible neuropathy.  NERVE CONDUCTION STUDIES:  Nerve conduction studies were performed on both upper extremities. The distal motor latencies for the median nerves were prolonged bilaterally with normal motor amplitudes for these nerves bilaterally. The distal motor latencies and motor amplitudes for the ulnar nerves were normal bilaterally. The F wave latencies and nerve conduction velocities for the median and ulnar nerves were normal bilaterally. The sensory latencies for the median nerves were prolonged bilaterally, normal for the ulnar nerves bilaterally.  Nerve conduction studies were performed on the left lower extremity. The distal motor latencies and motor amplitudes for the peroneal and posterior tibial nerves were within normal limits. The nerve conduction velocities for these nerves were also normal. The H reflex latency was normal. The sensory latency for the peroneal nerve was within normal limits.   EMG STUDIES:  EMG study was performed on the left lower extremity:  The tibialis anterior muscle reveals 2 to 4K motor units with full recruitment. No fibrillations or positive waves were seen. The peroneus tertius muscle reveals 2 to 4K motor units with full recruitment. No fibrillations or positive waves were seen. The medial gastrocnemius muscle reveals 1 to 3K motor units with full recruitment. No fibrillations or positive waves were seen. The vastus lateralis muscle reveals 2 to 4K motor units with full recruitment. No fibrillations or positive waves were seen. The iliopsoas muscle reveals 2 to 4K motor units with full recruitment. No fibrillations or positive waves were seen. The biceps femoris muscle (long head) reveals 2 to 4K motor units with full recruitment. No fibrillations or positive waves were  seen. The lumbosacral paraspinal muscles were tested at 3 levels, and revealed no abnormalities of insertional activity at all 3 levels tested. There was good relaxation.   IMPRESSION:  Nerve conduction studies done on both upper extremities and on the left lower extremity shows no clear evidence of a peripheral neuropathy. There does appear to be an overlying bilateral carpal tunnel syndrome of mild severity. A small fiber neuropathy may not be apparent on standard nerve conduction study evaluations, clinical correlation is required. EMG evaluation of the left lower extremity was unremarkable, without evidence of an overlying lumbosacral radiculopathy.  Jill Alexanders MD 06/02/2016 1:47 PM  [facility] [address] [place], [place] [address]  Phone [phone] Fax [phone]

## 2016-06-02 NOTE — Progress Notes (Signed)
Please refer to EMG and nerve conduction study procedure note. 

## 2016-06-04 ENCOUNTER — Encounter (HOSPITAL_COMMUNITY)
Admission: RE | Admit: 2016-06-04 | Discharge: 2016-06-04 | Disposition: A | Discharge status: Home / Self Care | Payer: BC Managed Care – PPO | Source: Ambulatory Visit | Attending: Internal Medicine | Admitting: Internal Medicine

## 2016-06-04 DIAGNOSIS — I208 Other forms of angina pectoris: Secondary | ICD-10-CM

## 2016-06-04 DIAGNOSIS — R079 Chest pain, unspecified: Secondary | ICD-10-CM | POA: Diagnosis not present

## 2016-06-04 LAB — RPR: RPR: NONREACTIVE

## 2016-06-04 LAB — COPPER, SERUM: Copper: 106 ug/dL (ref 72–166)

## 2016-06-04 LAB — ANA W/REFLEX: Anti Nuclear Antibody(ANA): NEGATIVE

## 2016-06-04 LAB — SEDIMENTATION RATE: SED RATE: 2 mm/h (ref 0–40)

## 2016-06-04 LAB — VITAMIN B12

## 2016-06-04 LAB — B. BURGDORFI ANTIBODIES: Lyme IgG/IgM Ab: <0.91 {ISR} (ref 0.00–0.90)

## 2016-06-04 LAB — ANGIOTENSIN CONVERTING ENZYME: Angio Convert Enzyme: <15 U/L (ref 14–82)

## 2016-06-04 LAB — RHEUMATOID FACTOR

## 2016-06-06 ENCOUNTER — Encounter: Payer: Self-pay | Admitting: *Deleted

## 2016-06-06 ENCOUNTER — Encounter (HOSPITAL_COMMUNITY)
Admission: RE | Admit: 2016-06-06 | Discharge: 2016-06-06 | Disposition: A | Discharge status: Home / Self Care | Payer: BC Managed Care – PPO | Source: Ambulatory Visit | Attending: Internal Medicine | Admitting: Internal Medicine

## 2016-06-06 DIAGNOSIS — I208 Other forms of angina pectoris: Secondary | ICD-10-CM

## 2016-06-06 DIAGNOSIS — R079 Chest pain, unspecified: Secondary | ICD-10-CM | POA: Diagnosis not present

## 2016-06-09 ENCOUNTER — Encounter (HOSPITAL_COMMUNITY)
Admission: RE | Admit: 2016-06-09 | Discharge: 2016-06-09 | Disposition: A | Discharge status: Home / Self Care | Payer: BC Managed Care – PPO | Source: Ambulatory Visit | Attending: Internal Medicine | Admitting: Internal Medicine

## 2016-06-09 DIAGNOSIS — I208 Other forms of angina pectoris: Secondary | ICD-10-CM

## 2016-06-09 DIAGNOSIS — I2089 Other forms of angina pectoris: Secondary | ICD-10-CM

## 2016-06-09 DIAGNOSIS — R079 Chest pain, unspecified: Secondary | ICD-10-CM | POA: Diagnosis not present

## 2016-06-11 ENCOUNTER — Encounter (HOSPITAL_COMMUNITY)
Admission: RE | Admit: 2016-06-11 | Discharge: 2016-06-11 | Disposition: A | Discharge status: Home / Self Care | Payer: BC Managed Care – PPO | Source: Ambulatory Visit | Attending: Internal Medicine | Admitting: Internal Medicine

## 2016-06-11 DIAGNOSIS — I208 Other forms of angina pectoris: Secondary | ICD-10-CM

## 2016-06-11 DIAGNOSIS — R079 Chest pain, unspecified: Secondary | ICD-10-CM | POA: Diagnosis not present

## 2016-06-11 DIAGNOSIS — I2089 Other forms of angina pectoris: Secondary | ICD-10-CM

## 2016-06-11 NOTE — Progress Notes (Signed)
Cardiac Individual Treatment Plan  Patient Details  Name: Jocelyn [name] MRN: [id number] Date of Birth: 08-Feb-1966 Referring Provider:   Flowsheet Row CARDIAC REHAB PHASE II ORIENTATION from 03/04/2016 in [place]  Referring Provider  Cristopher Peru, MD.      Initial Encounter Date:  Flowsheet Row CARDIAC REHAB PHASE II ORIENTATION from 03/04/2016 in [place]  Date  03/04/16  Referring Provider  Cristopher Peru, MD.      Visit Diagnosis: Chronic stable angina [facility])  Patient's Home Medications on Admission:  Current Outpatient Prescriptions:  .  Ascorbic Acid (VITAMIN C) 500 MG tablet, Take 500 mg by mouth daily.  , Disp: , Rfl:  .  aspirin 81 MG tablet, Take 81 mg by mouth at bedtime. , Disp: , Rfl:  .  Calcium Carbonate (CALCIUM 500 PO), Take 1,000 mg by mouth daily. , Disp: , Rfl:  .  cetirizine (ZYRTEC) 10 MG tablet, Take 10 mg by mouth at bedtime. , Disp: , Rfl:  .  cholecalciferol (VITAMIN D) 1000 units tablet, Take 1,000 Units by mouth daily., Disp: , Rfl:  .  Coenzyme Q10 (CO Q 10) 100 MG CAPS, Take 100 mg by mouth daily. , Disp: , Rfl:  .  Cyanocobalamin (VITAMIN B 12) 250 MCG LOZG, Take 1 tablet by mouth daily. , Disp: , Rfl:  .  gabapentin (NEURONTIN) 100 MG capsule, Take 1 capsule by mouth daily., Disp: , Rfl:  .  Insulin Infusion Pump KIT, as directed. , Disp: , Rfl:  .  MAGNESIUM PO, Take 1 tablet by mouth daily. , Disp: , Rfl:  .  Multiple Vitamin (MULTIVITAMIN) tablet, Take 1 tablet by mouth daily.  , Disp: , Rfl:  .  nitroGLYCERIN (NITROSTAT) 0.4 MG SL tablet, Place 1 tablet (0.4 mg total) under the tongue every 5 (five) minutes as needed for chest pain., Disp: 30 tablet, Rfl: 12 .  NOVOLOG 100 UNIT/ML injection, as directed., Disp: , Rfl:  .  pantoprazole (PROTONIX) 40 MG tablet, Take 40 mg by mouth See admin instructions. Takes 40 mg every morning, can take a second 40 mg if needed for acid, Disp: ,  Rfl:  .  Pitavastatin Calcium (LIVALO) 2 MG TABS, Take 2 mg by mouth at bedtime. , Disp: , Rfl:  .  ramipril (ALTACE) 2.5 MG capsule, Take 2.5 mg by mouth at bedtime., Disp: , Rfl:  .  sertraline (ZOLOFT) 100 MG tablet, Take 100 mg by mouth daily before breakfast., Disp: , Rfl:  .  verapamil (CALAN-SR) 120 MG CR tablet, Take 1 tablet (120 mg total) by mouth at bedtime., Disp: 30 tablet, Rfl: 30 .  ZETIA 10 MG tablet, TAKE 1 TAB (10 mg) BY MOUTH ONCE DAILY AT BEDTIME, Disp: , Rfl: 3  Past Medical History: Past Medical History:  Diagnosis Date  . Anxiety   . Carpal tunnel syndrome, bilateral 06/02/2016  . Chest pain    RECURRENT WITH NEGITIVE CATHERIZATIONS  . Complication of anesthesia    aspirated and "gained 15pounds, ended up in ICU"  . DM (diabetes mellitus) ([place])   . Dyslipidemia   . Fatty liver   . GERD (gastroesophageal reflux disease)   . History of CT scan    Coronary CTA 3/17: Calcium score 0; no plaque or stenosis noted in coronary arteries  . Hypercholesteremia   . Hypertension   . Migraine   . Obesity   . Pulmonary embolus ([place]) 2002  . Tachycardia  A fib    Tobacco Use: History  Smoking Status  . Never Smoker  Smokeless Tobacco  . Never Used    Labs: Recent Review Flowsheet Data    Labs for ITP Cardiac and Pulmonary Rehab Latest Ref Rng & Units 04/05/2015   Cholestrol 0 - 200 mg/dL 107   LDLCALC 0 - 99 mg/dL 33   HDL >40 mg/dL 54   Trlycerides <150 mg/dL 101      Capillary Blood Glucose: Lab Results  Component Value Date   GLUCAP 214 (H) 03/24/2016   GLUCAP 181 (H) 12/23/2015   GLUCAP 131 (H) 12/23/2015   GLUCAP 151 (H) 12/23/2015   GLUCAP 152 (H) 12/22/2015     Exercise Target Goals:    Exercise Program Goal: Individual exercise prescription set with THRR, safety & activity barriers. Participant demonstrates ability to understand and report RPE using BORG scale, to self-measure pulse accurately, and to acknowledge the importance of the  exercise prescription.  Exercise Prescription Goal: Starting with aerobic activity 30 plus minutes a day, 3 days per week for initial exercise prescription. Provide home exercise prescription and guidelines that participant acknowledges understanding prior to discharge.  Activity Barriers & Risk Stratification:     Activity Barriers & Cardiac Risk Stratification - 03/04/16 1411      Activity Barriers & Cardiac Risk Stratification   Activity Barriers Deconditioning;Muscular Weakness;None;Shortness of Breath;Back Problems   Cardiac Risk Stratification Moderate      6 Minute Walk:     6 Minute Walk    Row Name 03/04/16 1354 03/04/16 1355       6 Minute Walk   Phase Initial Initial    Distance 1756 feet 1756 feet    Walk Time 6 minutes 6 minutes    # of Rest Breaks 0 0    MPH 3.33 3.32    METS 4.69 4.69    RPE 12 12    VO2 Peak 16.43 16.4    Symptoms Yes (comment) Yes (comment)  4/10 chest discomfort    Comments Patient c/o chest discomfort "4/10" on the pain scale.  -    Resting HR 100 bpm 102 bpm    Resting BP 108/66 108/66    Max Ex. HR 130 bpm 130 bpm    Max Ex. BP 128/74 128/74       Oxygen Initial Assessment:   Oxygen Re-Evaluation:   Oxygen Discharge (Final Oxygen Re-Evaluation):   Initial Exercise Prescription:     Initial Exercise Prescription - 03/04/16 1600      Date of Initial Exercise RX and Referring Provider   Date 03/04/16   Referring Provider Cristopher Peru, MD.     Treadmill   MPH 2.5   Grade 1   Minutes 10   METs 3.26     Bike   Level 1   Minutes 10   METs 3.52     NuStep   Level 3   Minutes 10   METs 2     Prescription Details   Frequency (times per week) 3   Duration Progress to 30 minutes of continuous aerobic without signs/symptoms of physical distress     Intensity   THRR 40-80% of Max Heartrate 68-135   Ratings of Perceived Exertion 11-13   Perceived Dyspnea 0-4     Progression   Progression Continue to progress  workloads to maintain intensity without signs/symptoms of physical distress.     Resistance Training   Training Prescription Yes   Weight 2lbs  Reps 10-12      Perform Capillary Blood Glucose checks as needed.  Exercise Prescription Changes:     Exercise Prescription Changes    Row Name 04/03/16 1400 04/15/16 1000 05/12/16 1600 06/09/16 1600       Response to Exercise   Blood Pressure (Admit) 118/60 124/60 104/60 126/70    Blood Pressure (Exercise) 124/60 124/70 128/60 138/60    Blood Pressure (Exit) 114/60 110/62 106/62 104/56    Heart Rate (Admit) 97 bpm 97 bpm 100 bpm 99 bpm    Heart Rate (Exercise) 121 bpm 126 bpm 122 bpm 130 bpm    Heart Rate (Exit) 88 bpm 96 bpm 82 bpm 100 bpm    Rating of Perceived Exertion (Exercise) '13 15 14 14    '$ Symptoms 0 0 0 0    Comments reviewed HEP on 04/02/16.. See progress note reviewed HEP on 04/02/16.. See progress note reviewed HEP on 04/02/16.. See progress note reviewed HEP on 04/02/16.. See progress note    Duration Progress to 30 minutes of continuous aerobic without signs/symptoms of physical distress Progress to 30 minutes of continuous aerobic without signs/symptoms of physical distress Progress to 30 minutes of continuous aerobic without signs/symptoms of physical distress Progress to 30 minutes of  aerobic without signs/symptoms of physical distress    Intensity THRR unchanged THRR unchanged THRR unchanged THRR unchanged      Progression   Progression Continue progressive overload as per policy without signs/symptoms or physical distress. Continue progressive overload as per policy without signs/symptoms or physical distress. Continue progressive overload as per policy without signs/symptoms or physical distress. Continue to progress workloads to maintain intensity without signs/symptoms of physical distress.    Average METs 3.1 3 3.3 3.6      Resistance Training   Training Prescription Yes Yes Yes Yes    Weight 2lbs 2lbs 4lbs 4lbs     Reps 10-12 10-12 10-12 10-15    Time  -  -  - 10 Minutes      Treadmill   MPH 2.5 2.5 2.5 2.6    Grade '1 1 1 2    '$ Minutes '10 10 10 10    '$ METs 3.26 3.26 3.26 3.71      Bike   Level '1 1 1 1    '$ Minutes '10 10 10 10    '$ METs 3.47 3.47 3.47 3.45      NuStep   Level '3 3 4 5    '$ Minutes '10 10 10 10    '$ METs 2.6 2.3 3.1 3.6      Home Exercise Plan   Plans to continue exercise at Home  see progress note. Plans to walk for exercise Home  see progress note. Plans to walk for exercise Home  see progress note. Plans to walk for exercise Home (comment)  see progress note. Plans to walk for exercise    Frequency Add 2 additional days to program exercise sessions. Add 2 additional days to program exercise sessions. Add 2 additional days to program exercise sessions. Add 2 additional days to program exercise sessions.      Exercise Review   Progression Yes  - Yes Yes       Exercise Comments:     Exercise Comments    Row Name 04/03/16 1452 05/12/16 1622 06/10/16 1354       Exercise Comments Reviewed METs and goals. Pt is tolerating exercsie well; will continue to monitor exercise progression. Reviewed METs and goals. Pt is tolerating exercsie well;  will continue to monitor exercise progression. Reviewed METs and goals. Pt is tolerating exercsie well; will continue to monitor exercise progression.        Exercise Goals and Review:     Exercise Goals    Row Name 06/10/16 1352             Exercise Goals   Increase Physical Activity Yes       Intervention Provide advice, education, support and counseling about physical activity/exercise needs.;Develop an individualized exercise prescription for aerobic and resistive training based on initial evaluation findings, risk stratification, comorbidities and participant's personal goals.       Expected Outcomes Achievement of increased cardiorespiratory fitness and enhanced flexibility, muscular endurance and strength shown through  measurements of functional capacity and personal statement of participant.       Increase Strength and Stamina Yes       Intervention Provide advice, education, support and counseling about physical activity/exercise needs.;Develop an individualized exercise prescription for aerobic and resistive training based on initial evaluation findings, risk stratification, comorbidities and participant's personal goals.       Expected Outcomes Achievement of increased cardiorespiratory fitness and enhanced flexibility, muscular endurance and strength shown through measurements of functional capacity and personal statement of participant.          Exercise Goals Re-Evaluation :     Exercise Goals Re-Evaluation    Row Name 06/10/16 1352             Exercise Goal Re-Evaluation   Exercise Goals Review Increase Physical Activity;Increase Strenth and Stamina       Comments Pt stated having "increase in fitness levels" " flexibility and ROM"       Expected Outcomes Pt will continue to improve in cardiorespiratory fitness, overall strength, ROM and flexibility.           Discharge Exercise Prescription (Final Exercise Prescription Changes):     Exercise Prescription Changes - 06/09/16 1600      Response to Exercise   Blood Pressure (Admit) 126/70   Blood Pressure (Exercise) 138/60   Blood Pressure (Exit) 104/56   Heart Rate (Admit) 99 bpm   Heart Rate (Exercise) 130 bpm   Heart Rate (Exit) 100 bpm   Rating of Perceived Exertion (Exercise) 14   Symptoms 0   Comments reviewed HEP on 04/02/16.. See progress note   Duration Progress to 30 minutes of  aerobic without signs/symptoms of physical distress   Intensity THRR unchanged     Progression   Progression Continue to progress workloads to maintain intensity without signs/symptoms of physical distress.   Average METs 3.6     Resistance Training   Training Prescription Yes   Weight 4lbs   Reps 10-15   Time 10 Minutes     Treadmill    MPH 2.6   Grade 2   Minutes 10   METs 3.71     Bike   Level 1   Minutes 10   METs 3.45     NuStep   Level 5   Minutes 10   METs 3.6     Home Exercise Plan   Plans to continue exercise at Home (comment)  see progress note. Plans to walk for exercise   Frequency Add 2 additional days to program exercise sessions.     Exercise Review   Progression Yes      Nutrition:  Target Goals: Understanding of nutrition guidelines, daily intake of sodium '1500mg'$ , cholesterol '200mg'$ , calories 30% from fat and 7%  or less from saturated fats, daily to have 5 or more servings of fruits and vegetables.  Biometrics:     Pre Biometrics - 03/04/16 1353      Pre Biometrics   Height 5' 1.5" (1.562 m)   Weight 169 lb 1.5 oz (76.7 kg)   Waist Circumference 36.75 inches   Hip Circumference 44 inches   Waist to Hip Ratio 0.84 %   BMI (Calculated) 31.5   Triceps Skinfold 40 mm   % Body Fat 42.7 %   Grip Strength 26 kg   Flexibility 9.75 in   Single Leg Stand 26.31 seconds       Nutrition Therapy Plan and Nutrition Goals:     Nutrition Therapy & Goals - 03/19/16 1337      Nutrition Therapy   Diet Carb Modified, Therapeutic Lifestyle Changes     Personal Nutrition Goals   Nutrition Goal 1-2 lb wt loss/week to a wt loss goal of 6-24 lb at graduation from [place], educate and counsel regarding individualized specific dietary modifications aiming towards targeted core components such as weight, hypertension, lipid management, diabetes, heart failure and other comorbidities.   Expected Outcomes Long Term Goal: Adherence to prescribed nutrition plan.      Nutrition Discharge: Nutrition Scores:   Nutrition Goals Re-Evaluation:   Nutrition Goals Re-Evaluation:   Nutrition Goals Discharge (Final Nutrition Goals Re-Evaluation):   Psychosocial: Target Goals: Acknowledge presence or absence of significant depression and/or  stress, maximize coping skills, provide positive support system. Participant is able to verbalize types and ability to use techniques and skills needed for reducing stress and depression.  Initial Review & Psychosocial Screening:     Initial Psych Review & Screening - 03/05/16 1215      Family Dynamics   Good Support System? Yes     Barriers   Psychosocial barriers to participate in program The patient should benefit from training in stress management and relaxation.     Screening Interventions   Interventions Encouraged to exercise      Quality of Life Scores:     Quality of Life - 03/04/16 1355      Quality of Life Scores   Health/Function Pre 21.04 %   Socioeconomic Pre 27.43 %   Psych/Spiritual Pre 27.43 %   Family Pre 18 %   GLOBAL Pre 23.63 %      PHQ-9: Recent Review Flowsheet Data    Depression screen [facility] 2/9 03/24/2016   Decreased Interest 0   Down, Depressed, Hopeless 0   PHQ - 2 Score 0     Interpretation of Total Score  Total Score Depression Severity:  1-4 = Minimal depression, 5-9 = Mild depression, 10-14 = Moderate depression, 15-19 = Moderately severe depression, 20-27 = Severe depression   Psychosocial Evaluation and Intervention:     Psychosocial Evaluation - 03/24/16 0943      Psychosocial Evaluation & Interventions   Interventions Encouraged to exercise with the program and follow exercise prescription   Comments no psychosocial needs identified, no intervention necessary    Continue Psychosocial Services  No      Psychosocial Re-Evaluation:     Psychosocial Re-Evaluation    [place] Name 04/15/16 0759 05/12/16 0725 06/11/16 0734         Psychosocial Re-Evaluation   Comments no psychosocial needs identified, no interventions necessary. pt enjoys making chrismons at her church and is currently participating in light exercise at home.  no psychosocial needs identified, no interventions necessary.  pt has been walking for home exercise and  insprires her dad to walk with her.  pt is interested in participating in cardiac maintenance program.  no psychosocial needs identified, no interventions necessary.  pt has been walking for home exercise.  pt is averaging 5000-10000 steps daily.   pt is interested in participating in cardiac maintenance program.      Expected Outcomes  -  - pt will continue current healthy behaviors to facilitate good coping skills and positive, hopeful outlook.      Interventions Encouraged to attend Cardiac Rehabilitation for the exercise Encouraged to attend Cardiac Rehabilitation for the exercise Encouraged to attend Cardiac Rehabilitation for the exercise     Continue Psychosocial Services  No No No Follow up required        Psychosocial Discharge (Final Psychosocial Re-Evaluation):     Psychosocial Re-Evaluation - 06/11/16 0734      Psychosocial Re-Evaluation   Comments no psychosocial needs identified, no interventions necessary.  pt has been walking for home exercise.  pt is averaging 5000-10000 steps daily.   pt is interested in participating in cardiac maintenance program.    Expected Outcomes pt will continue current healthy behaviors to facilitate good coping skills and positive, hopeful outlook.    Interventions Encouraged to attend Cardiac Rehabilitation for the exercise   Continue Psychosocial Services  No Follow up required      Vocational Rehabilitation: Provide vocational rehab assistance to qualifying candidates.   Vocational Rehab Evaluation & Intervention:     Vocational Rehab - 03/05/16 1215      Initial Vocational Rehab Evaluation & Intervention   Assessment shows need for Vocational Rehabilitation No      Education: Education Goals: Education classes will be provided on a weekly basis, covering required topics. Participant will state understanding/return demonstration of topics presented.  Learning Barriers/Preferences:     Learning Barriers/Preferences - 03/04/16 1112       Learning Barriers/Preferences   Learning Barriers None   Learning Preferences Verbal Instruction;Written Material;Video;Skilled Demonstration;Group Instruction      Education Topics: Count Your Pulse:  -Group instruction provided by verbal instruction, demonstration, patient participation and written materials to support subject.  Instructors address importance of being able to find your pulse and how to count your pulse when at home without a heart monitor.  Patients get hands on experience counting their pulse with staff help and individually.   Heart Attack, Angina, and Risk Factor Modification:  -Group instruction provided by verbal instruction, video, and written materials to support subject.  Instructors address signs and symptoms of angina and heart attacks.    Also discuss risk factors for heart disease and how to make changes to improve heart health risk factors. Flowsheet Row CARDIAC REHAB PHASE II EXERCISE from 06/11/2016 in [place]  Date  05/07/16  Instruction Review Code  2- meets goals/outcomes      Functional Fitness:  -Group instruction provided by verbal instruction, demonstration, patient participation, and written materials to support subject.  Instructors address safety measures for doing things around the house.  Discuss how to get up and down off the floor, how to pick things up properly, how to safely get out of a chair without assistance, and balance training. Flowsheet Row CARDIAC REHAB PHASE II EXERCISE from 06/11/2016 in [place]  Date  04/04/16  Educator  Cleda Mccreedy, [place]  Instruction Review Code  2- meets  goals/outcomes      Meditation and Mindfulness:  -Group instruction provided by verbal instruction, patient participation, and written materials to support subject.  Instructor addresses importance of mindfulness and meditation practice to help reduce stress and improve awareness.   Instructor also leads participants through a meditation exercise.  Flowsheet Row CARDIAC REHAB PHASE II EXERCISE from 06/11/2016 in [place]  Date  06/04/16  Instruction Review Code  2- meets goals/outcomes      Stretching for Flexibility and Mobility:  -Group instruction provided by verbal instruction, patient participation, and written materials to support subject.  Instructors lead participants through series of stretches that are designed to increase flexibility thus improving mobility.  These stretches are additional exercise for major muscle groups that are typically performed during regular warm up and cool down. Flowsheet Row CARDIAC REHAB PHASE II EXERCISE from 06/11/2016 in [place]  Date  05/09/16  Instruction Review Code  2- meets goals/outcomes      Hands Only CPR Anytime:  -Group instruction provided by verbal instruction, video, patient participation and written materials to support subject.  Instructors co-teach with [facility] video for hands only CPR.  Participants get hands on experience with mannequins.   Nutrition I class: Heart Healthy Eating:  -Group instruction provided by PowerPoint slides, verbal discussion, and written materials to support subject matter. The instructor gives an explanation and review of the Therapeutic Lifestyle Changes diet recommendations, which includes a discussion on lipid goals, dietary fat, sodium, fiber, plant stanol/sterol esters, sugar, and the components of a well-balanced, healthy diet.   Nutrition II class: Lifestyle Skills:  -Group instruction provided by PowerPoint slides, verbal discussion, and written materials to support subject matter. The instructor gives an explanation and review of label reading, grocery shopping for heart health, heart healthy recipe modifications, and ways to make healthier choices when eating out. Flowsheet Row CARDIAC REHAB PHASE II EXERCISE from  06/11/2016 in [place]  Date  04/22/16  Educator  RD  Instruction Review Code  2- meets goals/outcomes      Diabetes Question & Answer:  -Group instruction provided by PowerPoint slides, verbal discussion, and written materials to support subject matter. The instructor gives an explanation and review of diabetes co-morbidities, pre- and post-prandial blood glucose goals, pre-exercise blood glucose goals, signs, symptoms, and treatment of hypoglycemia and hyperglycemia, and foot care basics. Flowsheet Row CARDIAC REHAB PHASE II EXERCISE from 06/11/2016 in [place]  Date  04/25/16  Educator  RD  Instruction Review Code  2- meets goals/outcomes      Diabetes Blitz:  -Group instruction provided by PowerPoint slides, verbal discussion, and written materials to support subject matter. The instructor gives an explanation and review of the physiology behind type 1 and type 2 diabetes, diabetes medications and rational behind using different medications, pre- and post-prandial blood glucose recommendations and Hemoglobin A1c goals, diabetes diet, and exercise including blood glucose guidelines for exercising safely.  Flowsheet Row CARDIAC REHAB PHASE II EXERCISE from 06/11/2016 in [place]  Date  04/29/16  Educator  RD  Instruction Review Code  2- meets goals/outcomes      Portion Distortion:  -Group instruction provided by PowerPoint slides, verbal discussion, written materials, and food models to support subject matter. The instructor gives an explanation of serving size versus portion size, changes in portions sizes over the last 20 years, and what consists of a serving  from each food group. Flowsheet Row CARDIAC REHAB PHASE II EXERCISE from 06/11/2016 in [facility] CARDIAC REHAB  Date  06/11/16  Educator  RD  Instruction Review Code  2- meets goals/outcomes      Stress  Management:  -Group instruction provided by verbal instruction, video, and written materials to support subject matter.  Instructors review role of stress in heart disease and how to cope with stress positively.     Exercising on Your Own:  -Group instruction provided by verbal instruction, power point, and written materials to support subject.  Instructors discuss benefits of exercise, components of exercise, frequency and intensity of exercise, and end points for exercise.  Also discuss use of nitroglycerin and activating EMS.  Review options of places to exercise outside of rehab.  Review guidelines for sex with heart disease. Flowsheet Row CARDIAC REHAB PHASE II EXERCISE from 06/11/2016 in [facility] CARDIAC REHAB  Date  05/14/16  Instruction Review Code  2- meets goals/outcomes      Cardiac Drugs I:  -Group instruction provided by verbal instruction and written materials to support subject.  Instructor reviews cardiac drug classes: antiplatelets, anticoagulants, beta blockers, and statins.  Instructor discusses reasons, side effects, and lifestyle considerations for each drug class. Flowsheet Row CARDIAC REHAB PHASE II EXERCISE from 06/11/2016 in [facility] CARDIAC REHAB  Date  03/26/16  Instruction Review Code  2- meets goals/outcomes      Cardiac Drugs II:  -Group instruction provided by verbal instruction and written materials to support subject.  Instructor reviews cardiac drug classes: angiotensin converting enzyme inhibitors (ACE-I), angiotensin II receptor blockers (ARBs), nitrates, and calcium channel blockers.  Instructor discusses reasons, side effects, and lifestyle considerations for each drug class. Flowsheet Row CARDIAC REHAB PHASE II EXERCISE from 06/11/2016 in [facility] CARDIAC REHAB  Date  04/23/16  Instruction Review Code  2- meets goals/outcomes      Anatomy and Physiology of the Circulatory System:  -Group  instruction provided by verbal instruction, video, and written materials to support subject.  Reviews functional anatomy of heart, how it relates to various diagnoses, and what role the heart plays in the overall system. Flowsheet Row CARDIAC REHAB PHASE II EXERCISE from 06/11/2016 in [facility] CARDIAC REHAB  Date  05/21/16  Instruction Review Code  2- meets goals/outcomes      Knowledge Questionnaire Score:     Knowledge Questionnaire Score - 03/19/16 1336      Knowledge Questionnaire Score   Pre Score 23/24  DM 14/15      Core Components/Risk Factors/Patient Goals at Admission:     Personal Goals and Risk Factors at Admission - 03/04/16 1414      Core Components/Risk Factors/Patient Goals on Admission   Increase Strength and Stamina Yes   Intervention Provide advice, education, support and counseling about physical activity/exercise needs.;Develop an individualized exercise prescription for aerobic and resistive training based on initial evaluation findings, risk stratification, comorbidities and participant's personal goals.   Expected Outcomes Achievement of increased cardiorespiratory fitness and enhanced flexibility, muscular endurance and strength shown through measurements of functional capacity and personal statement of participant.   Tobacco Cessation Yes   Intervention Assist the participant in steps to quit. Provide individualized education and counseling about committing to Tobacco Cessation, relapse prevention, and pharmacological support that can be provided by physician.;Education officer, environmental, assist with locating and accessing local/national Quit Smoking programs, and support quit date choice.   Expected Outcomes  Short Term: Will demonstrate readiness to quit, by selecting a quit date.;Short Term: Will quit all tobacco product use, adhering to prevention of relapse plan.;Long Term: Complete abstinence from all tobacco products for at least 12  months from quit date.   Personal Goal Other Yes   Personal Goal short: increase fitness levels and decrease aches and pains with fitness. Long: increase ROM and flexibility   Intervention Provide exercise programming to assist with improving cardiovascular fitness, improve ROM and flexibility   Expected Outcomes Pt will be able to develop an exercise routine, improve flexibility and ROM      Core Components/Risk Factors/Patient Goals Review:      Goals and Risk Factor Review    Row Name 04/03/16 1453 05/12/16 1622           Core Components/Risk Factors/Patient Goals Review   Personal Goals Review Other;Increase Strength and Stamina  -      Review Reviewed HEP with patient. Pt plans to walk 2x/week in addition to coming to [facility] Pt stated " improved flexibility and ROM throughtout" pt expressed interest in cardiac rehab maintenance for HEP after completed monitored program      Expected Outcomes Pt will be able to exercise at home independently and without difficulty. Pt will continue to benefit from stretching and warm up classes to improve flexibility/ROM and functional mobility. Pt will successfully complete cardiac rehab monitored and exercise safely in CR maintenance.         Core Components/Risk Factors/Patient Goals at Discharge (Final Review):      Goals and Risk Factor Review - 05/12/16 1622      Core Components/Risk Factors/Patient Goals Review   Review Pt stated " improved flexibility and ROM throughtout" pt expressed interest in cardiac rehab maintenance for HEP after completed monitored program   Expected Outcomes Pt will continue to benefit from stretching and warm up classes to improve flexibility/ROM and functional mobility. Pt will successfully complete cardiac rehab monitored and exercise safely in CR maintenance.      ITP Comments:     ITP Comments    Row Name 03/04/16 1112 04/11/16 0825 05/14/16 1127 05/16/16 0834     ITP Comments Dr. Golden Hurter, Medical  Director  Attended CPR education class, Met outcomes/goals  Dr. Sheppard Evens Director  Attended HTN education 05/16/16       Comments: Pt is making expected progress toward personal goals after completing 29 sessions. Recommend continued exercise and life style modification education including  stress management and relaxation techniques to decrease cardiac risk profile.

## 2016-06-13 ENCOUNTER — Encounter (HOSPITAL_COMMUNITY)
Admission: RE | Admit: 2016-06-13 | Discharge: 2016-06-13 | Disposition: A | Discharge status: Home / Self Care | Payer: BC Managed Care – PPO | Source: Ambulatory Visit | Attending: Internal Medicine | Admitting: Internal Medicine

## 2016-06-13 DIAGNOSIS — I2089 Other forms of angina pectoris: Secondary | ICD-10-CM

## 2016-06-13 DIAGNOSIS — R079 Chest pain, unspecified: Secondary | ICD-10-CM | POA: Insufficient documentation

## 2016-06-13 DIAGNOSIS — I208 Other forms of angina pectoris: Secondary | ICD-10-CM

## 2016-06-16 ENCOUNTER — Encounter (HOSPITAL_COMMUNITY)
Admission: RE | Admit: 2016-06-16 | Discharge: 2016-06-16 | Disposition: A | Discharge status: Home / Self Care | Payer: BC Managed Care – PPO | Source: Ambulatory Visit | Attending: Internal Medicine | Admitting: Internal Medicine

## 2016-06-16 ENCOUNTER — Encounter: Payer: Self-pay | Admitting: Internal Medicine

## 2016-06-16 ENCOUNTER — Ambulatory Visit (INDEPENDENT_AMBULATORY_CARE_PROVIDER_SITE_OTHER): Payer: BC Managed Care – PPO | Admitting: Internal Medicine

## 2016-06-16 ENCOUNTER — Encounter (HOSPITAL_COMMUNITY): Admission: RE | Admit: 2016-06-16 | Payer: BC Managed Care – PPO | Source: Ambulatory Visit

## 2016-06-16 VITALS — BP 116/68 | HR 97 | Ht 61.0 in | Wt 169.0 lb

## 2016-06-16 DIAGNOSIS — G629 Polyneuropathy, unspecified: Secondary | ICD-10-CM | POA: Diagnosis not present

## 2016-06-16 DIAGNOSIS — R079 Chest pain, unspecified: Secondary | ICD-10-CM

## 2016-06-16 DIAGNOSIS — I208 Other forms of angina pectoris: Secondary | ICD-10-CM

## 2016-06-16 NOTE — Patient Instructions (Addendum)
Medication Instructions:  Your physician recommends that you continue on your current medications as directed. Please refer to the Current Medication list given to you today.   Labwork: None Ordered   Testing/Procedures: None Ordered   Follow-Up: Your physician wants you to follow-up in: 6 months with Dr. Lovena Le. You will receive a reminder letter in the mail two months in advance. If you don't receive a letter, please call our office to schedule the follow-up appointment.    Any Other Special Instructions Will Be Listed Below (If Applicable). Continue cardiac rehab    If you need a refill on your cardiac medications before your next appointment, please call your pharmacy.

## 2016-06-16 NOTE — Progress Notes (Signed)
HPI Jocelyn [name] return today for followup. She is a very pleasant 51 year old woman with a long history of chest pain, and catheterization on 2 different occasions demonstrating no obstructive coronary disease. The patient has started cardiac rehab and is doing well. She has chest pain daily which is non-exertional mostly. While doing her 6 minute walk test she had more chest pain and was promptly stopped. She continues with acid suppression and she has also noticed some hand numbness and was started on neurontin and has been back to her neurologist Dr. Jannifer Franklin.  Allergies  Allergen Reactions  . Pumpkin Flavor Anaphylaxis    Squash also  . Salmon [Fish Allergy] Anaphylaxis  . Crestor [Rosuvastatin Calcium] Other (See Comments)    Muscle pain  . Lipitor [Atorvastatin] Other (See Comments)    Muscle pain  . Sulfonamide Derivatives Other (See Comments)    REACTION: GI distress  . Morphine And Related Rash     Current Outpatient Prescriptions  Medication Sig Dispense Refill  . Ascorbic Acid (VITAMIN C) 500 MG tablet Take 500 mg by mouth daily.      Marland Kitchen aspirin 81 MG tablet Take 81 mg by mouth at bedtime.     . Calcium Carbonate (CALCIUM 500 PO) Take 1,000 mg by mouth daily.     . cetirizine (ZYRTEC) 10 MG tablet Take 10 mg by mouth at bedtime.     . cholecalciferol (VITAMIN D) 1000 units tablet Take 1,000 Units by mouth daily.    . Coenzyme Q10 (CO Q 10) 100 MG CAPS Take 100 mg by mouth daily.     . Cyanocobalamin (VITAMIN B 12) 250 MCG LOZG Take 1 tablet by mouth daily.     Marland Kitchen gabapentin (NEURONTIN) 100 MG capsule Take 1 capsule by mouth daily.    . Insulin Infusion Pump KIT as directed.     Marland Kitchen MAGNESIUM PO Take 1 tablet by mouth daily.     . Multiple Vitamin (MULTIVITAMIN) tablet Take 1 tablet by mouth daily.      . nitroGLYCERIN (NITROSTAT) 0.4 MG SL tablet Place 1 tablet (0.4 mg total) under the tongue every 5 (five) minutes as needed for chest pain. 30 tablet 12  . NOVOLOG 100  UNIT/ML injection as directed.    . pantoprazole (PROTONIX) 40 MG tablet Take 40 mg by mouth See admin instructions. Takes 40 mg every morning, can take a second 40 mg if needed for acid    . Pitavastatin Calcium (LIVALO) 2 MG TABS Take 2 mg by mouth at bedtime.     . ramipril (ALTACE) 2.5 MG capsule Take 2.5 mg by mouth at bedtime.    . sertraline (ZOLOFT) 100 MG tablet Take 100 mg by mouth daily before breakfast.    . verapamil (CALAN-SR) 120 MG CR tablet Take 1 tablet (120 mg total) by mouth at bedtime. 30 tablet 30  . ZETIA 10 MG tablet TAKE 1 TAB (10 mg) BY MOUTH ONCE DAILY AT BEDTIME  3   No current facility-administered medications for this visit.      Past Medical History:  Diagnosis Date  . Anxiety   . Carpal tunnel syndrome, bilateral 06/02/2016  . Chest pain    RECURRENT WITH NEGITIVE CATHERIZATIONS  . Complication of anesthesia    aspirated and "gained 15pounds, ended up in ICU"  . DM (diabetes mellitus) ([place])   . Dyslipidemia   . Fatty liver   . GERD (gastroesophageal reflux disease)   . History of CT  scan    Coronary CTA 3/17: Calcium score 0; no plaque or stenosis noted in coronary arteries  . Hypercholesteremia   . Hypertension   . Migraine   . Obesity   . Pulmonary embolus ([place]) 2002  . Tachycardia    A fib    ROS:   All systems reviewed and negative except as noted in the HPI.   Past Surgical History:  Procedure Laterality Date  . CARDIAC CATHETERIZATION     x2  . CARPAL TUNNEL RELEASE     right  . CHOLECYSTECTOMY  03/24/2012   Procedure: LAPAROSCOPIC CHOLECYSTECTOMY WITH INTRAOPERATIVE CHOLANGIOGRAM;  Surgeon: Edward Jolly, MD;  Location: [facility] ORS;  Service: General;  Laterality: N/A;  . ESOPHAGEAL MANOMETRY N/A 05/28/2015   Procedure: ESOPHAGEAL MANOMETRY (EM);  Surgeon: Irene Shipper, MD;  Location: [facility] ENDOSCOPY;  Service: Gastroenterology;  Laterality: N/A;  . FIBROID TUMOR     UTERUS SURGERY  . LAPAROSCOPIC HYSTERECTOMY       Family  History  Problem Relation Age of Onset  . Irritable bowel syndrome Father   . Gallbladder disease Mother   . Gallbladder disease      3 mat. uncles  . Cancer Maternal Grandmother     ovarian  . Colon cancer Neg Hx      Social History   Social History  . Marital status: Single    Spouse name: N/A  . Number of children: 0  . Years of education: Post-grad   Occupational History  . registered dietician     [employer]   Social History Main Topics  . Smoking status: Never Smoker  . Smokeless tobacco: Never Used  . Alcohol use Yes     Comment: rarely  . Drug use: No  . Sexual activity: Not on file   Other Topics Concern  . Not on file   Social History Narrative   Lives at home alone   Right-handed   Caffeine: chocolate     BP 116/68   Pulse 97   Ht '5\' 1"'$  (1.549 m)   Wt 169 lb (76.7 kg)   SpO2 97%   BMI 31.93 kg/m   Physical Exam:  well appearing middle-aged woman, NAD HEENT: Unremarkable Neck:  6 cm JVD, no thyromegally Lymphatics:  No adenopathy Back:  no CVA tenderness Lungs:  Clear, with no wheezes, rales, or rhonchi. HEART:  Regular rate rhythm, no murmurs, no rubs, no clicks Abd:  soft, obese, positive bowel sounds, no organomegally, no rebound, no guarding Ext:  2 plus pulses, no edema, no cyanosis, no clubbing Skin:  No rashes no nodules Neuro:  CN II through XII intact, motor grossly intact  ecg - NSR with low voltage  Assess/Plan: 1. Chest pain - her symptoms are not due to epicardial CAD.  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 Will continue verapamil. She is encouraged to continue cardiac rehab. I do not think she needs to stop exercising if she has chest pressure.   2. Palpitations - she has sinus tachycardia. This has improved. She clearly has autonomic dysfunction. 3. HTN - her blood pressure has been controlled. Will continue verapamil and lisinopril and plan to slowly uptitrate as her pressure allows.  4. Dyslipdemia - she  has had side effects from multiple statins and appears to be tolerating her Pitavastatin  Mikle Bosworth.D.

## 2016-06-17 ENCOUNTER — Telehealth: Payer: Self-pay

## 2016-06-17 NOTE — Telephone Encounter (Signed)
Sent documents to medical records to be faxed to [place] at [phone].

## 2016-06-18 ENCOUNTER — Encounter (HOSPITAL_COMMUNITY)
Admission: RE | Admit: 2016-06-18 | Discharge: 2016-06-18 | Disposition: A | Discharge status: Home / Self Care | Payer: BC Managed Care – PPO | Source: Ambulatory Visit | Attending: Internal Medicine | Admitting: Internal Medicine

## 2016-06-18 DIAGNOSIS — R079 Chest pain, unspecified: Secondary | ICD-10-CM | POA: Diagnosis not present

## 2016-06-18 DIAGNOSIS — I2089 Other forms of angina pectoris: Secondary | ICD-10-CM

## 2016-06-18 DIAGNOSIS — I208 Other forms of angina pectoris: Secondary | ICD-10-CM

## 2016-06-20 ENCOUNTER — Encounter (HOSPITAL_COMMUNITY)
Admission: RE | Admit: 2016-06-20 | Discharge: 2016-06-20 | Disposition: A | Discharge status: Home / Self Care | Payer: BC Managed Care – PPO | Source: Ambulatory Visit | Attending: Internal Medicine | Admitting: Internal Medicine

## 2016-06-20 DIAGNOSIS — I2089 Other forms of angina pectoris: Secondary | ICD-10-CM

## 2016-06-20 DIAGNOSIS — I208 Other forms of angina pectoris: Secondary | ICD-10-CM

## 2016-06-20 DIAGNOSIS — R079 Chest pain, unspecified: Secondary | ICD-10-CM | POA: Diagnosis not present

## 2016-06-23 ENCOUNTER — Encounter (HOSPITAL_COMMUNITY): Payer: BC Managed Care – PPO

## 2016-06-25 ENCOUNTER — Encounter (HOSPITAL_COMMUNITY)
Admission: RE | Admit: 2016-06-25 | Discharge: 2016-06-25 | Disposition: A | Discharge status: Home / Self Care | Payer: BC Managed Care – PPO | Source: Ambulatory Visit | Attending: Internal Medicine | Admitting: Internal Medicine

## 2016-06-25 DIAGNOSIS — R079 Chest pain, unspecified: Secondary | ICD-10-CM | POA: Diagnosis not present

## 2016-06-25 DIAGNOSIS — I2089 Other forms of angina pectoris: Secondary | ICD-10-CM

## 2016-06-25 DIAGNOSIS — I208 Other forms of angina pectoris: Secondary | ICD-10-CM

## 2016-06-27 ENCOUNTER — Encounter (HOSPITAL_COMMUNITY)
Admission: RE | Admit: 2016-06-27 | Discharge: 2016-06-27 | Disposition: A | Discharge status: Home / Self Care | Payer: BC Managed Care – PPO | Source: Ambulatory Visit | Attending: Internal Medicine | Admitting: Internal Medicine

## 2016-06-27 ENCOUNTER — Encounter (HOSPITAL_COMMUNITY): Payer: Self-pay

## 2016-06-27 VITALS — Ht 61.5 in | Wt 170.6 lb

## 2016-06-27 DIAGNOSIS — I208 Other forms of angina pectoris: Secondary | ICD-10-CM

## 2016-06-27 DIAGNOSIS — R079 Chest pain, unspecified: Secondary | ICD-10-CM | POA: Diagnosis not present

## 2016-06-27 NOTE — Progress Notes (Addendum)
Cardiac Individual Treatment Plan  Patient Details  Name: Jocelyn [name] MRN: [id number] Date of Birth: 09/18/1965 Referring Provider:     CARDIAC REHAB PHASE II ORIENTATION from 03/04/2016 in [place]  Referring Provider  Cristopher Peru, MD.      Initial Encounter Date:    CARDIAC REHAB PHASE II ORIENTATION from 03/04/2016 in [place]  Date  03/04/16  Referring Provider  Cristopher Peru, MD.      Visit Diagnosis: Chronic stable angina [facility])  Patient's Home Medications on Admission:  Current Outpatient Prescriptions:  .  Ascorbic Acid (VITAMIN C) 500 MG tablet, Take 500 mg by mouth daily.  , Disp: , Rfl:  .  aspirin 81 MG tablet, Take 81 mg by mouth at bedtime. , Disp: , Rfl:  .  Calcium Carbonate (CALCIUM 500 PO), Take 1,000 mg by mouth daily. , Disp: , Rfl:  .  cetirizine (ZYRTEC) 10 MG tablet, Take 10 mg by mouth at bedtime. , Disp: , Rfl:  .  cholecalciferol (VITAMIN D) 1000 units tablet, Take 1,000 Units by mouth daily., Disp: , Rfl:  .  Coenzyme Q10 (CO Q 10) 100 MG CAPS, Take 100 mg by mouth daily. , Disp: , Rfl:  .  Cyanocobalamin (VITAMIN B 12) 250 MCG LOZG, Take 1 tablet by mouth daily. , Disp: , Rfl:  .  gabapentin (NEURONTIN) 100 MG capsule, Take 1 capsule by mouth daily., Disp: , Rfl:  .  Insulin Infusion Pump KIT, as directed. , Disp: , Rfl:  .  MAGNESIUM PO, Take 1 tablet by mouth daily. , Disp: , Rfl:  .  Multiple Vitamin (MULTIVITAMIN) tablet, Take 1 tablet by mouth daily.  , Disp: , Rfl:  .  nitroGLYCERIN (NITROSTAT) 0.4 MG SL tablet, Place 1 tablet (0.4 mg total) under the tongue every 5 (five) minutes as needed for chest pain., Disp: 30 tablet, Rfl: 12 .  NOVOLOG 100 UNIT/ML injection, as directed., Disp: , Rfl:  .  pantoprazole (PROTONIX) 40 MG tablet, Take 40 mg by mouth See admin instructions. Takes 40 mg every morning, can take a second 40 mg if needed for acid, Disp: , Rfl:  .  Pitavastatin  Calcium (LIVALO) 2 MG TABS, Take 2 mg by mouth at bedtime. , Disp: , Rfl:  .  ramipril (ALTACE) 2.5 MG capsule, Take 2.5 mg by mouth at bedtime., Disp: , Rfl:  .  sertraline (ZOLOFT) 100 MG tablet, Take 100 mg by mouth daily before breakfast., Disp: , Rfl:  .  verapamil (CALAN-SR) 120 MG CR tablet, Take 1 tablet (120 mg total) by mouth at bedtime., Disp: 30 tablet, Rfl: 30 .  ZETIA 10 MG tablet, TAKE 1 TAB (10 mg) BY MOUTH ONCE DAILY AT BEDTIME, Disp: , Rfl: 3  Past Medical History: Past Medical History:  Diagnosis Date  . Anxiety   . Carpal tunnel syndrome, bilateral 06/02/2016  . Chest pain    RECURRENT WITH NEGITIVE CATHERIZATIONS  . Complication of anesthesia    aspirated and "gained 15pounds, ended up in ICU"  . DM (diabetes mellitus) ([place])   . Dyslipidemia   . Fatty liver   . GERD (gastroesophageal reflux disease)   . History of CT scan    Coronary CTA 3/17: Calcium score 0; no plaque or stenosis noted in coronary arteries  . Hypercholesteremia   . Hypertension   . Migraine   . Obesity   . Pulmonary embolus ([place]) 2002  . Tachycardia  A fib    Tobacco Use: History  Smoking Status  . Never Smoker  Smokeless Tobacco  . Never Used    Labs: Recent Review Flowsheet Data    Labs for ITP Cardiac and Pulmonary Rehab Latest Ref Rng & Units 04/05/2015   Cholestrol 0 - 200 mg/dL 107   LDLCALC 0 - 99 mg/dL 33   HDL >40 mg/dL 54   Trlycerides <150 mg/dL 101      Capillary Blood Glucose: Lab Results  Component Value Date   GLUCAP 214 (H) 03/24/2016   GLUCAP 181 (H) 12/23/2015   GLUCAP 131 (H) 12/23/2015   GLUCAP 151 (H) 12/23/2015   GLUCAP 152 (H) 12/22/2015     Exercise Target Goals:    Exercise Program Goal: Individual exercise prescription set with THRR, safety & activity barriers. Participant demonstrates ability to understand and report RPE using BORG scale, to self-measure pulse accurately, and to acknowledge the importance of the exercise  prescription.  Exercise Prescription Goal: Starting with aerobic activity 30 plus minutes a day, 3 days per week for initial exercise prescription. Provide home exercise prescription and guidelines that participant acknowledges understanding prior to discharge.  Activity Barriers & Risk Stratification:     Activity Barriers & Cardiac Risk Stratification - 03/04/16 1411      Activity Barriers & Cardiac Risk Stratification   Activity Barriers Deconditioning;Muscular Weakness;None;Shortness of Breath;Back Problems   Cardiac Risk Stratification Moderate      6 Minute Walk:     6 Minute Walk    Row Name 03/04/16 1354 03/04/16 1355 06/30/16 1523     6 Minute Walk   Phase Initial Initial Discharge   Distance 1756 feet 1756 feet 1721 feet   Distance % Change  -  - 1.99 %   Walk Time 6 minutes 6 minutes 6 minutes   # of Rest Breaks 0 0 0   MPH 3.33 3.32 3.32   METS 4.69 4.69 4.52   RPE '12 12 16   '$ VO2 Peak 16.43 16.4 15.84   Symptoms Yes (comment) Yes (comment)  4/10 chest discomfort No   Comments Patient c/o chest discomfort "4/10" on the pain scale.  -  -   Resting HR 100 bpm 102 bpm 89 bpm   Resting BP 108/66 108/66 122/78   Max Ex. HR 130 bpm 130 bpm 114 bpm   Max Ex. BP 128/74 128/74 128/60   2 Minute Post BP  -  - 114/74      Oxygen Initial Assessment:   Oxygen Re-Evaluation:   Oxygen Discharge (Final Oxygen Re-Evaluation):   Initial Exercise Prescription:     Initial Exercise Prescription - 03/04/16 1600      Date of Initial Exercise RX and Referring Provider   Date 03/04/16   Referring Provider Cristopher Peru, MD.     Treadmill   MPH 2.5   Grade 1   Minutes 10   METs 3.26     Bike   Level 1   Minutes 10   METs 3.52     NuStep   Level 3   Minutes 10   METs 2     Prescription Details   Frequency (times per week) 3   Duration Progress to 30 minutes of continuous aerobic without signs/symptoms of physical distress     Intensity   THRR 40-80%  of Max Heartrate 68-135   Ratings of Perceived Exertion 11-13   Perceived Dyspnea 0-4     Progression   Progression Continue to  progress workloads to maintain intensity without signs/symptoms of physical distress.     Resistance Training   Training Prescription Yes   Weight 2lbs   Reps 10-12      Perform Capillary Blood Glucose checks as needed.  Exercise Prescription Changes:      Exercise Prescription Changes    Row Name 04/03/16 1400 04/15/16 1000 05/12/16 1600 06/09/16 1600 06/24/16 1500     Response to Exercise   Blood Pressure (Admit) 118/60 124/60 104/60 126/70 104/70   Blood Pressure (Exercise) 124/60 124/70 128/60 138/60 120/70   Blood Pressure (Exit) 114/60 110/62 106/62 104/56 102/60   Heart Rate (Admit) 97 bpm 97 bpm 100 bpm 99 bpm 99 bpm   Heart Rate (Exercise) 121 bpm 126 bpm 122 bpm 130 bpm 131 bpm   Heart Rate (Exit) 88 bpm 96 bpm 82 bpm 100 bpm 105 bpm   Rating of Perceived Exertion (Exercise) '13 15 14 14 14   '$ Symptoms 0 0 0 0 0   Comments reviewed HEP on 04/02/16.. See progress note reviewed HEP on 04/02/16.. See progress note reviewed HEP on 04/02/16.. See progress note reviewed HEP on 04/02/16.. See progress note reviewed HEP on 04/02/16.. See progress note   Duration Progress to 30 minutes of continuous aerobic without signs/symptoms of physical distress Progress to 30 minutes of continuous aerobic without signs/symptoms of physical distress Progress to 30 minutes of continuous aerobic without signs/symptoms of physical distress Progress to 30 minutes of  aerobic without signs/symptoms of physical distress Progress to 30 minutes of  aerobic without signs/symptoms of physical distress   Intensity THRR unchanged THRR unchanged THRR unchanged THRR unchanged THRR unchanged     Progression   Progression Continue progressive overload as per policy without signs/symptoms or physical distress. Continue progressive overload as per policy without signs/symptoms or  physical distress. Continue progressive overload as per policy without signs/symptoms or physical distress. Continue to progress workloads to maintain intensity without signs/symptoms of physical distress. Continue to progress workloads to maintain intensity without signs/symptoms of physical distress.   Average METs 3.1 3 3.3 3.6 3.7     Resistance Training   Training Prescription Yes Yes Yes Yes Yes   Weight 2lbs 2lbs 4lbs 4lbs 4lbs   Reps 10-12 10-12 10-12 10-15 10-15   Time  -  -  - 10 Minutes 10 Minutes     Treadmill   MPH 2.5 2.5 2.5 2.6 2.6   Grade '1 1 1 2 2   '$ Minutes '10 10 10 10 10   '$ METs 3.26 3.26 3.26 3.71 3.71     Bike   Level '1 1 1 1 1   '$ Minutes '10 10 10 10 10   '$ METs 3.47 3.47 3.47 3.45 3.45     NuStep   Level '3 3 4 5 5   '$ Minutes '10 10 10 10 10   '$ METs 2.6 2.3 3.1 3.6 3.8     Home Exercise Plan   Plans to continue exercise at Home  see progress note. Plans to walk for exercise Home  see progress note. Plans to walk for exercise Home  see progress note. Plans to walk for exercise Home (comment)  see progress note. Plans to walk for exercise Home (comment)  see progress note. Plans to walk for exercise   Frequency Add 2 additional days to program exercise sessions. Add 2 additional days to program exercise sessions. Add 2 additional days to program exercise sessions. Add 2 additional days to program exercise sessions. Add  2 additional days to program exercise sessions.     Exercise Review   Progression Yes  - Yes Yes Yes   Row Name 06/30/16 1500             Response to Exercise   Blood Pressure (Admit) 120/60       Blood Pressure (Exercise) 146/70       Blood Pressure (Exit) 108/62       Heart Rate (Admit) 85 bpm       Heart Rate (Exercise) 124 bpm       Heart Rate (Exit) 98 bpm       Rating of Perceived Exertion (Exercise) 14       Symptoms 0       Comments reviewed HEP on 04/02/16.. See progress note       Duration Progress to 30 minutes of  aerobic  without signs/symptoms of physical distress       Intensity THRR unchanged         Progression   Progression Continue to progress workloads to maintain intensity without signs/symptoms of physical distress.       Average METs 3.5         Resistance Training   Training Prescription Yes       Weight 4lbs       Reps 10-15       Time 10 Minutes         Treadmill   MPH 2.6       Grade 2       Minutes 10       METs 3.71         Bike   Level 1       Minutes 10       METs 3.45         NuStep   Level 5       Minutes 10       METs 3.4         Home Exercise Plan   Plans to continue exercise at Home (comment)  see progress note. Plans to walk for exercise       Frequency Add 2 additional days to program exercise sessions.         Exercise Review   Progression Yes          Exercise Comments:      Exercise Comments    Row Name 04/03/16 1452 05/12/16 1622 06/10/16 1354 06/30/16 1520     Exercise Comments Reviewed METs and goals. Pt is tolerating exercsie well; will continue to monitor exercise progression. Reviewed METs and goals. Pt is tolerating exercsie well; will continue to monitor exercise progression. Reviewed METs and goals. Pt is tolerating exercsie well; will continue to monitor exercise progression. Pt completed 36 sessions of cardiac rehab. Pt plans to walk 2x/week and do cardiac rehab maintenance 3x/week. Discussed temperature/emergency precautions. Pt verbalize understanding       Exercise Goals and Review:      Exercise Goals    Row Name 06/10/16 1352             Exercise Goals   Increase Physical Activity Yes       Intervention Provide advice, education, support and counseling about physical activity/exercise needs.;Develop an individualized exercise prescription for aerobic and resistive training based on initial evaluation findings, risk stratification, comorbidities and participant's personal goals.       Expected Outcomes Achievement of increased  cardiorespiratory fitness and enhanced flexibility, muscular endurance and strength  shown through measurements of functional capacity and personal statement of participant.       Increase Strength and Stamina Yes       Intervention Provide advice, education, support and counseling about physical activity/exercise needs.;Develop an individualized exercise prescription for aerobic and resistive training based on initial evaluation findings, risk stratification, comorbidities and participant's personal goals.       Expected Outcomes Achievement of increased cardiorespiratory fitness and enhanced flexibility, muscular endurance and strength shown through measurements of functional capacity and personal statement of participant.          Exercise Goals Re-Evaluation :     Exercise Goals Re-Evaluation    Row Name 06/10/16 1352 06/30/16 1537           Exercise Goal Re-Evaluation   Exercise Goals Review Increase Physical Activity;Increase Strenth and Stamina Increase Strenth and Stamina;Increase Physical Activity      Comments Pt stated having "increase in fitness levels" " flexibility and ROM" Pt tolerated exercise well in cardiac rehab. Pt MET level increased from 3.0 to 3.7      Expected Outcomes Pt will continue to improve in cardiorespiratory fitness, overall strength, ROM and flexibility. Pt will continue to improve in cardiorespiratory fitness, overall strength, ROM and flexibility.          Discharge Exercise Prescription (Final Exercise Prescription Changes):     Exercise Prescription Changes - 06/30/16 1500      Response to Exercise   Blood Pressure (Admit) 120/60   Blood Pressure (Exercise) 146/70   Blood Pressure (Exit) 108/62   Heart Rate (Admit) 85 bpm   Heart Rate (Exercise) 124 bpm   Heart Rate (Exit) 98 bpm   Rating of Perceived Exertion (Exercise) 14   Symptoms 0   Comments reviewed HEP on 04/02/16.. See progress note   Duration Progress to 30 minutes of  aerobic  without signs/symptoms of physical distress   Intensity THRR unchanged     Progression   Progression Continue to progress workloads to maintain intensity without signs/symptoms of physical distress.   Average METs 3.5     Resistance Training   Training Prescription Yes   Weight 4lbs   Reps 10-15   Time 10 Minutes     Treadmill   MPH 2.6   Grade 2   Minutes 10   METs 3.71     Bike   Level 1   Minutes 10   METs 3.45     NuStep   Level 5   Minutes 10   METs 3.4     Home Exercise Plan   Plans to continue exercise at Home (comment)  see progress note. Plans to walk for exercise   Frequency Add 2 additional days to program exercise sessions.     Exercise Review   Progression Yes      Nutrition:  Target Goals: Understanding of nutrition guidelines, daily intake of sodium '1500mg'$ , cholesterol '200mg'$ , calories 30% from fat and 7% or less from saturated fats, daily to have 5 or more servings of fruits and vegetables.  Biometrics:     Pre Biometrics - 03/04/16 1353      Pre Biometrics   Height 5' 1.5" (1.562 m)   Weight 169 lb 1.5 oz (76.7 kg)   Waist Circumference 36.75 inches   Hip Circumference 44 inches   Waist to Hip Ratio 0.84 %   BMI (Calculated) 31.5   Triceps Skinfold 40 mm   % Body Fat 42.7 %   Grip Strength 26  kg   Flexibility 9.75 in   Single Leg Stand 26.31 seconds         Post Biometrics - 06/30/16 1539       Post  Biometrics   Height 5' 1.5" (1.562 m)   Weight 170 lb 10.2 oz (77.4 kg)   Waist Circumference 38 inches   Hip Circumference 43.25 inches   Waist to Hip Ratio 0.88 %   BMI (Calculated) 31.8   Triceps Skinfold 34 mm   % Body Fat 42.5 %   Grip Strength 25 kg   Flexibility 10 in   Single Leg Stand 30 seconds      Nutrition Therapy Plan and Nutrition Goals:     Nutrition Therapy & Goals - 03/19/16 1337      Nutrition Therapy   Diet Carb Modified, Therapeutic Lifestyle Changes     Personal Nutrition Goals   Nutrition  Goal 1-2 lb wt loss/week to a wt loss goal of 6-24 lb at graduation from [place], educate and counsel regarding individualized specific dietary modifications aiming towards targeted core components such as weight, hypertension, lipid management, diabetes, heart failure and other comorbidities.   Expected Outcomes Long Term Goal: Adherence to prescribed nutrition plan.      Nutrition Discharge: Nutrition Scores:     Nutrition Assessments - 06/27/16 1018      MEDFICTS Scores   Pre Score 44   Post Score 38   Score Difference -6      Nutrition Goals Re-Evaluation:     Nutrition Goals Re-Evaluation    Row Name 06/27/16 1018             Goals   Current Weight 169 lb 1.5 oz (76.7 kg)       Nutrition Goal 1-2 lb wt loss/week to a wt loss goal of 6-24 lb at graduation from [place] maintained her wt while in Cardiac Rehab.        Expected Outcome Continue heart healthy diet and active lifestyle to promote desired slow, wt loss.           Nutrition Goals Re-Evaluation:     Nutrition Goals Re-Evaluation    [place] Name 06/27/16 1018             Goals   Current Weight 169 lb 1.5 oz (76.7 kg)       Nutrition Goal 1-2 lb wt loss/week to a wt loss goal of 6-24 lb at graduation from [place] maintained her wt while in Cardiac Rehab.        Expected Outcome Continue heart healthy diet and active lifestyle to promote desired slow, wt loss.           Nutrition Goals Discharge (Final Nutrition Goals Re-Evaluation):     Nutrition Goals Re-Evaluation - 06/27/16 1018      Goals   Current Weight 169 lb 1.5 oz (76.7 kg)   Nutrition Goal 1-2 lb wt loss/week to a wt loss goal of 6-24 lb at graduation from [place] maintained her wt while in Cardiac Rehab.    Expected Outcome Continue heart healthy diet and active lifestyle to promote desired slow, wt loss.        Psychosocial: Target Goals: Acknowledge presence or absence of significant depression and/or stress, maximize coping skills, provide positive support system. Participant is  able to verbalize types and ability to use techniques and skills needed for reducing stress and depression.  Initial Review & Psychosocial Screening:     Initial Psych Review & Screening - 03/05/16 1215      Family Dynamics   Good Support System? Yes     Barriers   Psychosocial barriers to participate in program The patient should benefit from training in stress management and relaxation.     Screening Interventions   Interventions Encouraged to exercise      Quality of Life Scores:     Quality of Life - 06/26/16 1626      Quality of Life Scores   Health/Function Pre 21.04 %   Health/Function Post 22.25 %   Health/Function % Change 5.75 %   Socioeconomic Pre 27.43 %   Socioeconomic Post 28.43 %   Socioeconomic % Change  3.65 %   Psych/Spiritual Pre 27.43 %   Psych/Spiritual Post 29.14 %   Psych/Spiritual % Change 6.23 %   Family Pre 18 %   Family Post 25.17 %   Family % Change 39.83 %   GLOBAL Pre 23.63 %   GLOBAL Post 25.48 %   GLOBAL % Change 7.83 %      PHQ-9: Recent Review Flowsheet Data    Depression screen [facility] 2/9 06/27/2016 03/24/2016   Decreased Interest 0 0   Down, Depressed, Hopeless 0 0   PHQ - 2 Score 0 0     Interpretation of Total Score  Total Score Depression Severity:  1-4 = Minimal depression, 5-9 = Mild depression, 10-14 = Moderate depression, 15-19 = Moderately severe depression, 20-27 = Severe depression   Psychosocial Evaluation and Intervention:     Psychosocial Evaluation - 06/27/16 1017      Psychosocial Evaluation & Interventions   Comments no psychosocial needs identified, no intervention necessary      Discharge Psychosocial Assessment & Intervention   Comments no psychosocial needs identified, no intervention necessary       Psychosocial  Re-Evaluation:     Psychosocial Re-Evaluation    Row Name 04/15/16 0759 05/12/16 0725 06/11/16 0734         Psychosocial Re-Evaluation   Comments no psychosocial needs identified, no interventions necessary. pt enjoys making chrismons at her church and is currently participating in light exercise at home.  no psychosocial needs identified, no interventions necessary.  pt has been walking for home exercise and insprires her dad to walk with her.  pt is interested in participating in cardiac maintenance program.  no psychosocial needs identified, no interventions necessary.  pt has been walking for home exercise.  pt is averaging 5000-10000 steps daily.   pt is interested in participating in cardiac maintenance program.      Expected Outcomes  -  - pt will continue current healthy behaviors to facilitate good coping skills and positive, hopeful outlook.      Interventions Encouraged to attend Cardiac Rehabilitation for the exercise Encouraged to attend Cardiac Rehabilitation for the exercise Encouraged to attend Cardiac Rehabilitation for the exercise     Continue Psychosocial Services  No No No Follow up required        Psychosocial Discharge (Final Psychosocial Re-Evaluation):     Psychosocial Re-Evaluation - 06/11/16 0734      Psychosocial Re-Evaluation   Comments no psychosocial needs identified, no interventions necessary.  pt has been walking for home exercise.  pt is averaging 5000-10000 steps daily.   pt is interested in participating in cardiac maintenance  program.    Expected Outcomes pt will continue current healthy behaviors to facilitate good coping skills and positive, hopeful outlook.    Interventions Encouraged to attend Cardiac Rehabilitation for the exercise   Continue Psychosocial Services  No Follow up required      Vocational Rehabilitation: Provide vocational rehab assistance to qualifying candidates.   Vocational Rehab Evaluation & Intervention:     Vocational  Rehab - 03/05/16 1215      Initial Vocational Rehab Evaluation & Intervention   Assessment shows need for Vocational Rehabilitation No      Education: Education Goals: Education classes will be provided on a weekly basis, covering required topics. Participant will state understanding/return demonstration of topics presented.  Learning Barriers/Preferences:     Learning Barriers/Preferences - 03/04/16 1112      Learning Barriers/Preferences   Learning Barriers None   Learning Preferences Verbal Instruction;Written Material;Video;Skilled Demonstration;Group Instruction      Education Topics: Count Your Pulse:  -Group instruction provided by verbal instruction, demonstration, patient participation and written materials to support subject.  Instructors address importance of being able to find your pulse and how to count your pulse when at home without a heart monitor.  Patients get hands on experience counting their pulse with staff help and individually.   Heart Attack, Angina, and Risk Factor Modification:  -Group instruction provided by verbal instruction, video, and written materials to support subject.  Instructors address signs and symptoms of angina and heart attacks.    Also discuss risk factors for heart disease and how to make changes to improve heart health risk factors.   CARDIAC REHAB PHASE II EXERCISE from 06/25/2016 in [facility] CARDIAC REHAB  Date  05/07/16  Instruction Review Code  2- meets goals/outcomes      Functional Fitness:  -Group instruction provided by verbal instruction, demonstration, patient participation, and written materials to support subject.  Instructors address safety measures for doing things around the house.  Discuss how to get up and down off the floor, how to pick things up properly, how to safely get out of a chair without assistance, and balance training.   CARDIAC REHAB PHASE II EXERCISE from 06/25/2016 in [facility] CARDIAC REHAB  Date  04/04/16  Educator  Rosine Door, MS  Instruction Review Code  2- meets goals/outcomes      Meditation and Mindfulness:  -Group instruction provided by verbal instruction, patient participation, and written materials to support subject.  Instructor addresses importance of mindfulness and meditation practice to help reduce stress and improve awareness.  Instructor also leads participants through a meditation exercise.    CARDIAC REHAB PHASE II EXERCISE from 06/25/2016 in [facility] CARDIAC REHAB  Date  06/04/16  Instruction Review Code  2- meets goals/outcomes      Stretching for Flexibility and Mobility:  -Group instruction provided by verbal instruction, patient participation, and written materials to support subject.  Instructors lead participants through series of stretches that are designed to increase flexibility thus improving mobility.  These stretches are additional exercise for major muscle groups that are typically performed during regular warm up and cool down.   CARDIAC REHAB PHASE II EXERCISE from 06/25/2016 in [facility] CARDIAC REHAB  Date  05/09/16  Instruction Review Code  2- meets goals/outcomes      Hands Only CPR Anytime:  -Group instruction provided by verbal instruction, video, patient participation and written materials to support subject.  Instructors co-teach with [employer]  for hands only CPR.  Participants get hands on experience with mannequins.   Nutrition I class: Heart Healthy Eating:  -Group instruction provided by PowerPoint slides, verbal discussion, and written materials to support subject matter. The instructor gives an explanation and review of the Therapeutic Lifestyle Changes diet recommendations, which includes a discussion on lipid goals, dietary fat, sodium, fiber, plant stanol/sterol esters, sugar, and the components of a well-balanced, healthy diet.   Nutrition II  class: Lifestyle Skills:  -Group instruction provided by PowerPoint slides, verbal discussion, and written materials to support subject matter. The instructor gives an explanation and review of label reading, grocery shopping for heart health, heart healthy recipe modifications, and ways to make healthier choices when eating out.   CARDIAC REHAB PHASE II EXERCISE from 06/25/2016 in [place]  Date  04/22/16  Educator  RD  Instruction Review Code  2- meets goals/outcomes      Diabetes Question & Answer:  -Group instruction provided by PowerPoint slides, verbal discussion, and written materials to support subject matter. The instructor gives an explanation and review of diabetes co-morbidities, pre- and post-prandial blood glucose goals, pre-exercise blood glucose goals, signs, symptoms, and treatment of hypoglycemia and hyperglycemia, and foot care basics.   CARDIAC REHAB PHASE II EXERCISE from 06/25/2016 in [place]  Date  04/25/16  Educator  RD  Instruction Review Code  2- meets goals/outcomes      Diabetes Blitz:  -Group instruction provided by PowerPoint slides, verbal discussion, and written materials to support subject matter. The instructor gives an explanation and review of the physiology behind type 1 and type 2 diabetes, diabetes medications and rational behind using different medications, pre- and post-prandial blood glucose recommendations and Hemoglobin A1c goals, diabetes diet, and exercise including blood glucose guidelines for exercising safely.    CARDIAC REHAB PHASE II EXERCISE from 06/25/2016 in [place]  Date  04/29/16  Educator  RD  Instruction Review Code  2- meets goals/outcomes      Portion Distortion:  -Group instruction provided by PowerPoint slides, verbal discussion, written materials, and food models to support subject matter. The instructor gives an explanation of  serving size versus portion size, changes in portions sizes over the last 20 years, and what consists of a serving from each food group.   CARDIAC REHAB PHASE II EXERCISE from 06/25/2016 in [place]  Date  06/11/16  Educator  RD  Instruction Review Code  2- meets goals/outcomes      Stress Management:  -Group instruction provided by verbal instruction, video, and written materials to support subject matter.  Instructors review role of stress in heart disease and how to cope with stress positively.     CARDIAC REHAB PHASE II EXERCISE from 06/25/2016 in [place]  Date  06/18/16  Instruction Review Code  2- meets goals/outcomes      Exercising on Your Own:  -Group instruction provided by verbal instruction, power point, and written materials to support subject.  Instructors discuss benefits of exercise, components of exercise, frequency and intensity of exercise, and end points for exercise.  Also discuss use of nitroglycerin and activating EMS.  Review options of places to exercise outside of rehab.  Review guidelines for sex with heart disease.   CARDIAC REHAB PHASE II EXERCISE from 06/25/2016 in [place]  Date  05/14/16  Instruction Review Code  2-  meets goals/outcomes      Cardiac Drugs I:  -Group instruction provided by verbal instruction and written materials to support subject.  Instructor reviews cardiac drug classes: antiplatelets, anticoagulants, beta blockers, and statins.  Instructor discusses reasons, side effects, and lifestyle considerations for each drug class.   CARDIAC REHAB PHASE II EXERCISE from 06/25/2016 in [place]  Date  03/26/16  Instruction Review Code  2- meets goals/outcomes      Cardiac Drugs II:  -Group instruction provided by verbal instruction and written materials to support subject.  Instructor reviews cardiac drug classes:  angiotensin converting enzyme inhibitors (ACE-I), angiotensin II receptor blockers (ARBs), nitrates, and calcium channel blockers.  Instructor discusses reasons, side effects, and lifestyle considerations for each drug class.   CARDIAC REHAB PHASE II EXERCISE from 06/25/2016 in [place]  Date  04/23/16  Instruction Review Code  2- meets goals/outcomes      Anatomy and Physiology of the Circulatory System:  -Group instruction provided by verbal instruction, video, and written materials to support subject.  Reviews functional anatomy of heart, how it relates to various diagnoses, and what role the heart plays in the overall system.   CARDIAC REHAB PHASE II EXERCISE from 06/25/2016 in [place]  Date  05/21/16  Instruction Review Code  2- meets goals/outcomes      Knowledge Questionnaire Score:     Knowledge Questionnaire Score - 06/25/16 1649      Knowledge Questionnaire Score   Post Score 23/24      Core Components/Risk Factors/Patient Goals at Admission:     Personal Goals and Risk Factors at Admission - 03/04/16 1414      Core Components/Risk Factors/Patient Goals on Admission   Increase Strength and Stamina Yes   Intervention Provide advice, education, support and counseling about physical activity/exercise needs.;Develop an individualized exercise prescription for aerobic and resistive training based on initial evaluation findings, risk stratification, comorbidities and participant's personal goals.   Expected Outcomes Achievement of increased cardiorespiratory fitness and enhanced flexibility, muscular endurance and strength shown through measurements of functional capacity and personal statement of participant.   Tobacco Cessation Yes   Intervention Assist the participant in steps to quit. Provide individualized education and counseling about committing to Tobacco Cessation, relapse prevention, and  pharmacological support that can be provided by physician.;Advice worker, assist with locating and accessing local/national Quit Smoking programs, and support quit date choice.   Expected Outcomes Short Term: Will demonstrate readiness to quit, by selecting a quit date.;Short Term: Will quit all tobacco product use, adhering to prevention of relapse plan.;Long Term: Complete abstinence from all tobacco products for at least 12 months from quit date.   Personal Goal Other Yes   Personal Goal short: increase fitness levels and decrease aches and pains with fitness. Long: increase ROM and flexibility   Intervention Provide exercise programming to assist with improving cardiovascular fitness, improve ROM and flexibility   Expected Outcomes Pt will be able to develop an exercise routine, improve flexibility and ROM      Core Components/Risk Factors/Patient Goals Review:      Goals and Risk Factor Review    Row Name 04/03/16 1453 05/12/16 1622 06/30/16 1539 07/01/16 1007       Core Components/Risk Factors/Patient Goals Review   Personal Goals Review Other;Increase Strength and Stamina  -  - Weight Management/Obesity    Review Reviewed HEP with patient. Pt plans to walk 2x/week in  addition to coming to [facility] Pt stated " improved flexibility and ROM throughtout" pt expressed interest in cardiac rehab maintenance for HEP after completed monitored program Pt ROM and flexibility has increased and is evident in post measurement data: Pt sit to reach test was initially 9.75  and most recent 10.0in Pt maintained her wt while in Cardiac Rehab    Expected Outcomes Pt will be able to exercise at home independently and without difficulty. Pt will continue to benefit from stretching and warm up classes to improve flexibility/ROM and functional mobility. Pt will successfully complete cardiac rehab monitored and exercise safely in CR maintenance.    Continue heart healthy diet and lifestyle choices to  promote desired wt loss.       Core Components/Risk Factors/Patient Goals at Discharge (Final Review):      Goals and Risk Factor Review - 07/01/16 1007      Core Components/Risk Factors/Patient Goals Review   Personal Goals Review Weight Management/Obesity   Review Pt maintained her wt while in Cardiac Rehab   Expected Outcomes Continue heart healthy diet and lifestyle choices to promote desired wt loss.      ITP Comments:     ITP Comments    Row Name 03/04/16 1112 04/11/16 0825 05/14/16 1127 05/16/16 0834     ITP Comments Dr. Golden Hurter, Medical Director  Attended CPR education class, Met outcomes/goals  Dr. Sheppard Evens Director  Attended HTN education 05/16/16       Comments: Pt graduated from cardiac rehab program today with completion of 36 exercise sessions in Phase II. Pt maintained good attendance and progressed nicely during his participation in rehab as evidenced by increased MET level.   Medication list reconciled. Repeat  PHQ score- 0 .  Pt has made significant lifestyle changes and should be commended for her success. Pt feels she has achieved her  goals during cardiac rehab, which include increased flexibility, increased strength/stamina, establishing consistent exercise routine, learning core strengthening exercises to incorporate into home exercise routine to decrease and tone abdominal fat.  In addition, it is noted at rehab sessions exercise is getting easier for patient as she is reporting lower RPE with exercise routine.    Pt plans to continue exercise in cardiac maintenance program.

## 2016-06-30 ENCOUNTER — Encounter (HOSPITAL_COMMUNITY): Payer: Self-pay

## 2016-06-30 ENCOUNTER — Encounter (HOSPITAL_COMMUNITY): Payer: BC Managed Care – PPO

## 2016-07-01 ENCOUNTER — Encounter (HOSPITAL_COMMUNITY): Payer: Self-pay

## 2016-07-02 ENCOUNTER — Encounter (HOSPITAL_COMMUNITY)
Admission: RE | Admit: 2016-07-02 | Discharge: 2016-07-02 | Disposition: A | Discharge status: Home / Self Care | Payer: Self-pay | Source: Ambulatory Visit | Attending: Internal Medicine | Admitting: Internal Medicine

## 2016-07-02 ENCOUNTER — Encounter (HOSPITAL_COMMUNITY): Payer: BC Managed Care – PPO

## 2016-07-02 DIAGNOSIS — I208 Other forms of angina pectoris: Secondary | ICD-10-CM | POA: Insufficient documentation

## 2016-07-03 ENCOUNTER — Encounter (HOSPITAL_COMMUNITY)
Admission: RE | Admit: 2016-07-03 | Discharge: 2016-07-03 | Disposition: A | Discharge status: Home / Self Care | Payer: Self-pay | Source: Ambulatory Visit | Attending: Internal Medicine | Admitting: Internal Medicine

## 2016-07-03 ENCOUNTER — Encounter (HOSPITAL_COMMUNITY): Payer: Self-pay

## 2016-07-04 ENCOUNTER — Encounter (HOSPITAL_COMMUNITY)
Admission: RE | Admit: 2016-07-04 | Discharge: 2016-07-04 | Disposition: A | Discharge status: Home / Self Care | Payer: Self-pay | Source: Ambulatory Visit | Attending: Internal Medicine | Admitting: Internal Medicine

## 2016-07-04 ENCOUNTER — Encounter (HOSPITAL_COMMUNITY): Payer: BC Managed Care – PPO

## 2016-07-07 ENCOUNTER — Encounter (HOSPITAL_COMMUNITY): Payer: Self-pay

## 2016-07-07 ENCOUNTER — Encounter (HOSPITAL_COMMUNITY)
Admission: RE | Admit: 2016-07-07 | Discharge: 2016-07-07 | Disposition: A | Discharge status: Home / Self Care | Payer: Self-pay | Source: Ambulatory Visit | Attending: Internal Medicine | Admitting: Internal Medicine

## 2016-07-08 ENCOUNTER — Encounter (HOSPITAL_COMMUNITY): Payer: Self-pay

## 2016-07-08 ENCOUNTER — Encounter (HOSPITAL_COMMUNITY)
Admission: RE | Admit: 2016-07-08 | Discharge: 2016-07-08 | Disposition: A | Discharge status: Home / Self Care | Payer: Self-pay | Source: Ambulatory Visit | Attending: Internal Medicine | Admitting: Internal Medicine

## 2016-07-09 ENCOUNTER — Encounter (HOSPITAL_COMMUNITY)
Admission: RE | Admit: 2016-07-09 | Discharge: 2016-07-09 | Disposition: A | Discharge status: Home / Self Care | Payer: BC Managed Care – PPO | Source: Ambulatory Visit | Attending: Internal Medicine | Admitting: Internal Medicine

## 2016-07-10 ENCOUNTER — Encounter (HOSPITAL_COMMUNITY): Payer: Self-pay

## 2016-07-14 ENCOUNTER — Encounter (HOSPITAL_COMMUNITY): Payer: Self-pay

## 2016-07-14 DIAGNOSIS — I208 Other forms of angina pectoris: Secondary | ICD-10-CM | POA: Insufficient documentation

## 2016-07-15 ENCOUNTER — Encounter (HOSPITAL_COMMUNITY): Payer: Self-pay

## 2016-07-17 ENCOUNTER — Encounter (HOSPITAL_COMMUNITY): Payer: Self-pay

## 2016-07-21 ENCOUNTER — Encounter (HOSPITAL_COMMUNITY): Payer: Self-pay

## 2016-07-22 ENCOUNTER — Encounter (HOSPITAL_COMMUNITY): Payer: Self-pay

## 2016-07-22 ENCOUNTER — Encounter (HOSPITAL_COMMUNITY)
Admission: RE | Admit: 2016-07-22 | Discharge: 2016-07-22 | Disposition: A | Discharge status: Home / Self Care | Payer: Self-pay | Source: Ambulatory Visit | Attending: Internal Medicine | Admitting: Internal Medicine

## 2016-07-24 ENCOUNTER — Encounter (HOSPITAL_COMMUNITY): Payer: Self-pay

## 2016-07-24 ENCOUNTER — Encounter (HOSPITAL_COMMUNITY)
Admission: RE | Admit: 2016-07-24 | Discharge: 2016-07-24 | Disposition: A | Discharge status: Home / Self Care | Payer: Self-pay | Source: Ambulatory Visit | Attending: Internal Medicine | Admitting: Internal Medicine

## 2016-07-25 ENCOUNTER — Encounter (HOSPITAL_COMMUNITY)
Admission: RE | Admit: 2016-07-25 | Discharge: 2016-07-25 | Disposition: A | Discharge status: Home / Self Care | Payer: Self-pay | Source: Ambulatory Visit | Attending: Internal Medicine | Admitting: Internal Medicine

## 2016-07-28 ENCOUNTER — Encounter (HOSPITAL_COMMUNITY): Payer: Self-pay

## 2016-07-29 ENCOUNTER — Encounter (HOSPITAL_COMMUNITY): Payer: Self-pay

## 2016-07-30 ENCOUNTER — Encounter (HOSPITAL_COMMUNITY)
Admission: RE | Admit: 2016-07-30 | Discharge: 2016-07-30 | Disposition: A | Discharge status: Home / Self Care | Payer: Self-pay | Source: Ambulatory Visit | Attending: Internal Medicine | Admitting: Internal Medicine

## 2016-07-31 ENCOUNTER — Encounter (HOSPITAL_COMMUNITY): Payer: Self-pay

## 2016-07-31 ENCOUNTER — Other Ambulatory Visit: Payer: Self-pay | Admitting: Internal Medicine

## 2016-07-31 ENCOUNTER — Encounter (HOSPITAL_COMMUNITY)
Admission: RE | Admit: 2016-07-31 | Discharge: 2016-07-31 | Disposition: A | Discharge status: Home / Self Care | Payer: Self-pay | Source: Ambulatory Visit | Attending: Internal Medicine | Admitting: Internal Medicine

## 2016-08-04 ENCOUNTER — Encounter (HOSPITAL_COMMUNITY)
Admission: RE | Admit: 2016-08-04 | Discharge: 2016-08-04 | Disposition: A | Discharge status: Home / Self Care | Payer: Self-pay | Source: Ambulatory Visit | Attending: Internal Medicine | Admitting: Internal Medicine

## 2016-08-04 ENCOUNTER — Encounter (HOSPITAL_COMMUNITY): Payer: Self-pay

## 2016-08-05 ENCOUNTER — Encounter (HOSPITAL_COMMUNITY): Payer: Self-pay

## 2016-08-07 ENCOUNTER — Encounter (HOSPITAL_COMMUNITY)
Admission: RE | Admit: 2016-08-07 | Discharge: 2016-08-07 | Disposition: A | Discharge status: Home / Self Care | Payer: Self-pay | Source: Ambulatory Visit | Attending: Internal Medicine | Admitting: Internal Medicine

## 2016-08-07 ENCOUNTER — Encounter (HOSPITAL_COMMUNITY): Payer: Self-pay

## 2016-08-08 ENCOUNTER — Encounter (HOSPITAL_COMMUNITY)
Admission: RE | Admit: 2016-08-08 | Discharge: 2016-08-08 | Disposition: A | Discharge status: Home / Self Care | Payer: Self-pay | Source: Ambulatory Visit | Attending: Internal Medicine | Admitting: Internal Medicine

## 2016-08-11 ENCOUNTER — Encounter (HOSPITAL_COMMUNITY): Payer: Self-pay

## 2016-08-11 ENCOUNTER — Encounter (HOSPITAL_COMMUNITY)
Admission: RE | Admit: 2016-08-11 | Discharge: 2016-08-11 | Disposition: A | Discharge status: Home / Self Care | Payer: Self-pay | Source: Ambulatory Visit | Attending: Internal Medicine | Admitting: Internal Medicine

## 2016-08-12 ENCOUNTER — Encounter (HOSPITAL_COMMUNITY): Payer: Self-pay

## 2016-08-12 DIAGNOSIS — I208 Other forms of angina pectoris: Secondary | ICD-10-CM | POA: Insufficient documentation

## 2016-08-14 ENCOUNTER — Encounter (HOSPITAL_COMMUNITY): Payer: Self-pay

## 2016-08-14 ENCOUNTER — Encounter (HOSPITAL_COMMUNITY)
Admission: RE | Admit: 2016-08-14 | Discharge: 2016-08-14 | Disposition: A | Discharge status: Home / Self Care | Payer: Self-pay | Source: Ambulatory Visit | Attending: Internal Medicine | Admitting: Internal Medicine

## 2016-08-18 ENCOUNTER — Encounter (HOSPITAL_COMMUNITY)
Admission: RE | Admit: 2016-08-18 | Discharge: 2016-08-18 | Disposition: A | Discharge status: Home / Self Care | Payer: Self-pay | Source: Ambulatory Visit | Attending: Internal Medicine | Admitting: Internal Medicine

## 2016-08-18 ENCOUNTER — Encounter (HOSPITAL_COMMUNITY): Payer: Self-pay

## 2016-08-19 ENCOUNTER — Encounter (HOSPITAL_COMMUNITY): Payer: Self-pay

## 2016-08-19 ENCOUNTER — Encounter (HOSPITAL_COMMUNITY)
Admission: RE | Admit: 2016-08-19 | Discharge: 2016-08-19 | Disposition: A | Discharge status: Home / Self Care | Payer: Self-pay | Source: Ambulatory Visit | Attending: Internal Medicine | Admitting: Internal Medicine

## 2016-08-21 ENCOUNTER — Encounter (HOSPITAL_COMMUNITY): Payer: Self-pay

## 2016-08-25 ENCOUNTER — Encounter (HOSPITAL_COMMUNITY): Payer: Self-pay

## 2016-08-25 ENCOUNTER — Encounter (HOSPITAL_COMMUNITY)
Admission: RE | Admit: 2016-08-25 | Discharge: 2016-08-25 | Disposition: A | Discharge status: Home / Self Care | Payer: Self-pay | Source: Ambulatory Visit | Attending: Internal Medicine | Admitting: Internal Medicine

## 2016-08-26 ENCOUNTER — Encounter (HOSPITAL_COMMUNITY)
Admission: RE | Admit: 2016-08-26 | Discharge: 2016-08-26 | Disposition: A | Discharge status: Home / Self Care | Payer: Self-pay | Source: Ambulatory Visit | Attending: Internal Medicine | Admitting: Internal Medicine

## 2016-08-26 ENCOUNTER — Encounter (HOSPITAL_COMMUNITY): Payer: Self-pay

## 2016-08-28 ENCOUNTER — Encounter (HOSPITAL_COMMUNITY): Payer: Self-pay

## 2016-08-28 ENCOUNTER — Encounter (HOSPITAL_COMMUNITY)
Admission: RE | Admit: 2016-08-28 | Discharge: 2016-08-28 | Disposition: A | Discharge status: Home / Self Care | Payer: Self-pay | Source: Ambulatory Visit | Attending: Internal Medicine | Admitting: Internal Medicine

## 2016-09-01 ENCOUNTER — Encounter (HOSPITAL_COMMUNITY)
Admission: RE | Admit: 2016-09-01 | Discharge: 2016-09-01 | Disposition: A | Discharge status: Home / Self Care | Payer: Self-pay | Source: Ambulatory Visit | Attending: Internal Medicine | Admitting: Internal Medicine

## 2016-09-01 ENCOUNTER — Encounter (HOSPITAL_COMMUNITY): Payer: Self-pay

## 2016-09-02 ENCOUNTER — Encounter (HOSPITAL_COMMUNITY)
Admission: RE | Admit: 2016-09-02 | Discharge: 2016-09-02 | Disposition: A | Discharge status: Home / Self Care | Payer: Self-pay | Source: Ambulatory Visit | Attending: Internal Medicine | Admitting: Internal Medicine

## 2016-09-02 ENCOUNTER — Encounter (HOSPITAL_COMMUNITY): Payer: Self-pay

## 2016-09-04 ENCOUNTER — Encounter (HOSPITAL_COMMUNITY): Payer: Self-pay

## 2016-09-04 ENCOUNTER — Encounter (HOSPITAL_COMMUNITY)
Admission: RE | Admit: 2016-09-04 | Discharge: 2016-09-04 | Disposition: A | Discharge status: Home / Self Care | Payer: Self-pay | Source: Ambulatory Visit | Attending: Internal Medicine | Admitting: Internal Medicine

## 2016-09-09 ENCOUNTER — Encounter (HOSPITAL_COMMUNITY)
Admission: RE | Admit: 2016-09-09 | Discharge: 2016-09-09 | Disposition: A | Discharge status: Home / Self Care | Payer: Self-pay | Source: Ambulatory Visit | Attending: Internal Medicine | Admitting: Internal Medicine

## 2016-09-09 ENCOUNTER — Encounter (HOSPITAL_COMMUNITY): Payer: Self-pay

## 2016-09-11 ENCOUNTER — Encounter (HOSPITAL_COMMUNITY): Payer: Self-pay

## 2016-09-11 ENCOUNTER — Encounter (HOSPITAL_COMMUNITY)
Admission: RE | Admit: 2016-09-11 | Discharge: 2016-09-11 | Disposition: A | Discharge status: Home / Self Care | Payer: Self-pay | Source: Ambulatory Visit | Attending: Internal Medicine | Admitting: Internal Medicine

## 2016-09-12 ENCOUNTER — Encounter (HOSPITAL_COMMUNITY)
Admission: RE | Admit: 2016-09-12 | Discharge: 2016-09-12 | Disposition: A | Discharge status: Home / Self Care | Payer: BC Managed Care – PPO | Source: Ambulatory Visit | Attending: Internal Medicine | Admitting: Internal Medicine

## 2016-09-12 DIAGNOSIS — I208 Other forms of angina pectoris: Secondary | ICD-10-CM | POA: Insufficient documentation

## 2016-09-15 ENCOUNTER — Encounter (HOSPITAL_COMMUNITY): Payer: Self-pay

## 2016-09-15 ENCOUNTER — Encounter (HOSPITAL_COMMUNITY)
Admission: RE | Admit: 2016-09-15 | Discharge: 2016-09-15 | Disposition: A | Discharge status: Home / Self Care | Payer: BC Managed Care – PPO | Source: Ambulatory Visit | Attending: Internal Medicine | Admitting: Internal Medicine

## 2016-09-15 DIAGNOSIS — I208 Other forms of angina pectoris: Secondary | ICD-10-CM | POA: Diagnosis present

## 2016-09-16 ENCOUNTER — Encounter (HOSPITAL_COMMUNITY): Payer: Self-pay

## 2016-09-16 ENCOUNTER — Encounter (HOSPITAL_COMMUNITY)
Admission: RE | Admit: 2016-09-16 | Discharge: 2016-09-16 | Disposition: A | Discharge status: Home / Self Care | Payer: BC Managed Care – PPO | Source: Ambulatory Visit | Attending: Internal Medicine | Admitting: Internal Medicine

## 2016-09-18 ENCOUNTER — Encounter (HOSPITAL_COMMUNITY)
Admission: RE | Admit: 2016-09-18 | Discharge: 2016-09-18 | Disposition: A | Discharge status: Home / Self Care | Payer: BC Managed Care – PPO | Source: Ambulatory Visit | Attending: Internal Medicine | Admitting: Internal Medicine

## 2016-09-18 ENCOUNTER — Encounter (HOSPITAL_COMMUNITY): Payer: Self-pay

## 2016-09-22 ENCOUNTER — Encounter (HOSPITAL_COMMUNITY)
Admission: RE | Admit: 2016-09-22 | Discharge: 2016-09-22 | Disposition: A | Discharge status: Home / Self Care | Payer: BC Managed Care – PPO | Source: Ambulatory Visit | Attending: Internal Medicine | Admitting: Internal Medicine

## 2016-09-22 ENCOUNTER — Encounter (HOSPITAL_COMMUNITY): Payer: Self-pay

## 2016-09-23 ENCOUNTER — Encounter (HOSPITAL_COMMUNITY)
Admission: RE | Admit: 2016-09-23 | Discharge: 2016-09-23 | Disposition: A | Discharge status: Home / Self Care | Payer: BC Managed Care – PPO | Source: Ambulatory Visit | Attending: Internal Medicine | Admitting: Internal Medicine

## 2016-09-23 ENCOUNTER — Encounter (HOSPITAL_COMMUNITY): Payer: Self-pay

## 2016-09-25 ENCOUNTER — Encounter (HOSPITAL_COMMUNITY)
Admission: RE | Admit: 2016-09-25 | Discharge: 2016-09-25 | Disposition: A | Discharge status: Home / Self Care | Payer: BC Managed Care – PPO | Source: Ambulatory Visit | Attending: Internal Medicine | Admitting: Internal Medicine

## 2016-09-25 ENCOUNTER — Encounter (HOSPITAL_COMMUNITY): Payer: Self-pay

## 2016-09-29 ENCOUNTER — Encounter (HOSPITAL_COMMUNITY): Payer: Self-pay

## 2016-09-29 ENCOUNTER — Encounter (HOSPITAL_COMMUNITY)
Admission: RE | Admit: 2016-09-29 | Discharge: 2016-09-29 | Disposition: A | Discharge status: Home / Self Care | Payer: BC Managed Care – PPO | Source: Ambulatory Visit | Attending: Internal Medicine | Admitting: Internal Medicine

## 2016-09-30 ENCOUNTER — Encounter (HOSPITAL_COMMUNITY): Payer: Self-pay

## 2016-09-30 ENCOUNTER — Encounter (HOSPITAL_COMMUNITY): Payer: BC Managed Care – PPO

## 2016-10-01 ENCOUNTER — Encounter (HOSPITAL_COMMUNITY)
Admission: RE | Admit: 2016-10-01 | Discharge: 2016-10-01 | Disposition: A | Discharge status: Home / Self Care | Payer: BC Managed Care – PPO | Source: Ambulatory Visit | Attending: Internal Medicine | Admitting: Internal Medicine

## 2016-10-02 ENCOUNTER — Encounter (HOSPITAL_COMMUNITY)
Admission: RE | Admit: 2016-10-02 | Discharge: 2016-10-02 | Disposition: A | Discharge status: Home / Self Care | Payer: BC Managed Care – PPO | Source: Ambulatory Visit | Attending: Internal Medicine | Admitting: Internal Medicine

## 2016-10-02 ENCOUNTER — Encounter (HOSPITAL_COMMUNITY): Payer: Self-pay

## 2016-10-06 ENCOUNTER — Encounter (HOSPITAL_COMMUNITY)
Admission: RE | Admit: 2016-10-06 | Discharge: 2016-10-06 | Disposition: A | Discharge status: Home / Self Care | Payer: BC Managed Care – PPO | Source: Ambulatory Visit | Attending: Internal Medicine | Admitting: Internal Medicine

## 2016-10-06 ENCOUNTER — Encounter (HOSPITAL_COMMUNITY): Payer: Self-pay

## 2016-10-07 ENCOUNTER — Encounter (HOSPITAL_COMMUNITY)
Admission: RE | Admit: 2016-10-07 | Discharge: 2016-10-07 | Disposition: A | Discharge status: Home / Self Care | Payer: BC Managed Care – PPO | Source: Ambulatory Visit | Attending: Internal Medicine | Admitting: Internal Medicine

## 2016-10-07 ENCOUNTER — Encounter (HOSPITAL_COMMUNITY): Payer: Self-pay

## 2016-10-09 ENCOUNTER — Encounter (HOSPITAL_COMMUNITY)
Admission: RE | Admit: 2016-10-09 | Discharge: 2016-10-09 | Disposition: A | Discharge status: Home / Self Care | Payer: BC Managed Care – PPO | Source: Ambulatory Visit | Attending: Internal Medicine | Admitting: Internal Medicine

## 2016-10-09 ENCOUNTER — Encounter (HOSPITAL_COMMUNITY): Payer: Self-pay

## 2016-10-13 ENCOUNTER — Encounter (HOSPITAL_COMMUNITY): Payer: BC Managed Care – PPO

## 2016-10-13 ENCOUNTER — Encounter (HOSPITAL_COMMUNITY): Payer: Self-pay

## 2016-10-13 DIAGNOSIS — I208 Other forms of angina pectoris: Secondary | ICD-10-CM | POA: Insufficient documentation

## 2016-10-14 ENCOUNTER — Encounter (HOSPITAL_COMMUNITY): Payer: Self-pay

## 2016-10-14 ENCOUNTER — Encounter (HOSPITAL_COMMUNITY)
Admission: RE | Admit: 2016-10-14 | Discharge: 2016-10-14 | Disposition: A | Discharge status: Home / Self Care | Payer: BC Managed Care – PPO | Source: Ambulatory Visit | Attending: Internal Medicine | Admitting: Internal Medicine

## 2016-10-14 DIAGNOSIS — I208 Other forms of angina pectoris: Secondary | ICD-10-CM | POA: Diagnosis present

## 2016-10-16 ENCOUNTER — Encounter (HOSPITAL_COMMUNITY)
Admission: RE | Admit: 2016-10-16 | Discharge: 2016-10-16 | Disposition: A | Discharge status: Home / Self Care | Payer: BC Managed Care – PPO | Source: Ambulatory Visit | Attending: Internal Medicine | Admitting: Internal Medicine

## 2016-10-16 ENCOUNTER — Encounter (HOSPITAL_COMMUNITY): Payer: Self-pay

## 2016-10-17 ENCOUNTER — Encounter (HOSPITAL_COMMUNITY)
Admission: RE | Admit: 2016-10-17 | Discharge: 2016-10-17 | Disposition: A | Discharge status: Home / Self Care | Payer: BC Managed Care – PPO | Source: Ambulatory Visit | Attending: Internal Medicine | Admitting: Internal Medicine

## 2016-10-20 ENCOUNTER — Encounter (HOSPITAL_COMMUNITY): Payer: Self-pay

## 2016-10-20 ENCOUNTER — Encounter (HOSPITAL_COMMUNITY)
Admission: RE | Admit: 2016-10-20 | Discharge: 2016-10-20 | Disposition: A | Discharge status: Home / Self Care | Payer: BC Managed Care – PPO | Source: Ambulatory Visit | Attending: Internal Medicine | Admitting: Internal Medicine

## 2016-10-21 ENCOUNTER — Encounter: Payer: Self-pay | Admitting: Neurology

## 2016-10-21 ENCOUNTER — Ambulatory Visit (INDEPENDENT_AMBULATORY_CARE_PROVIDER_SITE_OTHER): Payer: BC Managed Care – PPO | Admitting: Neurology

## 2016-10-21 ENCOUNTER — Encounter (INDEPENDENT_AMBULATORY_CARE_PROVIDER_SITE_OTHER): Payer: Self-pay

## 2016-10-21 ENCOUNTER — Encounter (HOSPITAL_COMMUNITY): Admission: RE | Admit: 2016-10-21 | Payer: BC Managed Care – PPO | Source: Ambulatory Visit

## 2016-10-21 ENCOUNTER — Telehealth: Payer: Self-pay | Admitting: Neurology

## 2016-10-21 VITALS — BP 121/73 | HR 88 | Ht 61.5 in | Wt 170.0 lb

## 2016-10-21 DIAGNOSIS — G5603 Carpal tunnel syndrome, bilateral upper limbs: Secondary | ICD-10-CM

## 2016-10-21 DIAGNOSIS — R42 Dizziness and giddiness: Secondary | ICD-10-CM

## 2016-10-21 NOTE — Progress Notes (Signed)
Reason for visit: Vertigo  Jocelyn [name] is an 51 y.o. female  History of present illness:  Jocelyn [name] is a 51 year old right-handed white female with a history of type 1 diabetes. The patient has had documented bilateral carpal tunnel syndrome, she is wearing wrist splints bilaterally which have been very helpful on the right side, the patient has gained some improvement on the left but the left hand remains more symptomatic. The patient comes in with a new problem today. She indicates that she has had brief episodes of dizziness or vertigo that last only a second or 2. The episodes have been present off and on since around 2007, but over the last 4-6 weeks, particularly within the last 2 weeks, the events have become much more frequent. They are now about every other day, unassociated with headache. The events previously would occur once every month or 2. The patient has not had any falls, she denies any slurred speech, difficulty swallowing, she denies any double vision or loss of vision. She has not had any syncope. She denies any new numbness or weakness on the arms or legs. She denies issues controlling the bowels or the bladder. She has not had any neck pain. She has been on Zoloft since 2002, there have been no changes in dosing and no change in the appearance of the tablet itself recently. The events of vertigo may occur unrelated to body position or head position, and may occur with or without head movement.  Past Medical History:  Diagnosis Date  . Anxiety   . Carpal tunnel syndrome, bilateral 06/02/2016  . Chest pain    RECURRENT WITH NEGITIVE CATHERIZATIONS  . Complication of anesthesia    aspirated and "gained 15pounds, ended up in ICU"  . DM (diabetes mellitus) ([place])   . Dyslipidemia   . Fatty liver   . GERD (gastroesophageal reflux disease)   . History of CT scan    Coronary CTA 3/17: Calcium score 0; no plaque or stenosis noted in coronary arteries  . Hypercholesteremia     . Hypertension   . Migraine   . Obesity   . Pulmonary embolus ([place]) 2002  . Tachycardia    A fib    Past Surgical History:  Procedure Laterality Date  . CARDIAC CATHETERIZATION     x2  . CARPAL TUNNEL RELEASE     right  . CHOLECYSTECTOMY  03/24/2012   Procedure: LAPAROSCOPIC CHOLECYSTECTOMY WITH INTRAOPERATIVE CHOLANGIOGRAM;  Surgeon: Edward Jolly, MD;  Location: [facility];  Service: General;  Laterality: N/A;  . ESOPHAGEAL MANOMETRY N/A 05/28/2015   Procedure: ESOPHAGEAL MANOMETRY (EM);  Surgeon: Irene Shipper, MD;  Location: [facility];  Service: Gastroenterology;  Laterality: N/A;  . FIBROID TUMOR     UTERUS SURGERY  . LAPAROSCOPIC HYSTERECTOMY      Family History  Problem Relation Age of Onset  . Irritable bowel syndrome Father   . Gallbladder disease Mother   . Gallbladder disease Unknown        3 mat. uncles  . Cancer Maternal Grandmother        ovarian  . Colon cancer Neg Hx     Social history:  reports that she has never smoked. She has never used smokeless tobacco. She reports that she drinks alcohol. She reports that she does not use drugs.    Allergies  Allergen Reactions  . Pumpkin Flavor Anaphylaxis    Squash also  . Salmon [Fish Allergy] Anaphylaxis  . Crestor [employer]  Calcium] Other (See Comments)    Muscle pain  . Lipitor [Atorvastatin] Other (See Comments)    Muscle pain  . Sulfonamide Derivatives Other (See Comments)    REACTION: GI distress  . Morphine And Related Rash    Medications:  Prior to Admission medications   Medication Sig Start Date End Date Taking? Authorizing Provider  Ascorbic Acid (VITAMIN C) 500 MG tablet Take 500 mg by mouth daily.     Yes [provider]  aspirin 81 MG tablet Take 81 mg by mouth at bedtime.    Yes [provider]  Calcium Carbonate (CALCIUM 500 PO) Take 1,000 mg by mouth daily.    Yes [provider]  cetirizine (ZYRTEC) 10 MG tablet Take 10 mg by mouth at bedtime.     Yes [provider]  cholecalciferol (VITAMIN D) 1000 units tablet Take 1,000 Units by mouth daily.   Yes [provider]  Coenzyme Q10 (CO Q 10) 100 MG CAPS Take 100 mg by mouth daily.    Yes [provider]  Cyanocobalamin (VITAMIN B 12) 250 MCG LOZG Take 1 tablet by mouth daily.    Yes [provider]  gabapentin (NEURONTIN) 100 MG capsule Take 1 capsule by mouth daily. 03/10/16  Yes [provider]  Insulin Infusion Pump KIT as directed.    Yes [provider]  MAGNESIUM PO Take 1 tablet by mouth daily.    Yes [provider]  Multiple Vitamin (MULTIVITAMIN) tablet Take 1 tablet by mouth daily.     Yes [provider]  nitroGLYCERIN (NITROSTAT) 0.4 MG SL tablet Place 1 tablet (0.4 mg total) under the tongue every 5 (five) minutes as needed for chest pain. 04/05/15  Yes Rai, Ripudeep K, MD  NOVOLOG 100 UNIT/ML injection as directed. 03/10/16  Yes [provider]  pantoprazole (PROTONIX) 40 MG tablet Take 40 mg by mouth See admin instructions. Takes 40 mg every morning, can take a second 40 mg if needed for acid   Yes [provider]  Pitavastatin Calcium (LIVALO) 2 MG TABS Take 2 mg by mouth at bedtime.    Yes [provider]  ramipril (ALTACE) 2.5 MG capsule Take 2.5 mg by mouth at bedtime.   Yes [provider]  sertraline (ZOLOFT) 100 MG tablet Take 100 mg by mouth daily before breakfast.   Yes [provider]  verapamil (CALAN-SR) 120 MG CR tablet TAKE 1 TABLET BY MOUTH AT BEDTIME 07/31/16  Yes Evans Lance, MD  ZETIA 10 MG tablet TAKE 1 TAB (10 mg) BY MOUTH ONCE DAILY AT BEDTIME 10/12/14  Yes [provider]    ROS:  Out of a complete 14 system review of symptoms, the patient complains only of the following symptoms, and all other reviewed systems are negative.  Chest tightness Back pain Dizziness, numbness  Blood pressure 121/73, pulse 88, height 5' 1.5"  (1.562 m), weight 170 lb (77.1 kg).  Physical Exam  General: The patient is alert and cooperative at the time of the examination. The patient is moderately obese.  Neuromuscular: Range of movement of the cervical spine is full.  Ears: Tympanic membranes are clear bilaterally.  Skin: No significant peripheral edema is noted.   Neurologic Exam  Mental status: The patient is alert and oriented x 3 at the time of the examination. The patient has apparent normal recent and remote memory, with an apparently normal attention span and concentration ability.   Cranial nerves: Facial symmetry is  present. Speech is normal, no aphasia or dysarthria is noted. Extraocular movements are full. Visual fields are full. Pupils are equal, round, and reactive to light. Discs are flat bilaterally.  Motor: The patient has good strength in all 4 extremities.  Sensory examination: Soft touch sensation is symmetric on the face, arms, and legs. Pinprick and vibratory sensation are symmetric throughout and normal.  Coordination: The patient has good finger-nose-finger and heel-to-shin bilaterally.  Gait and station: The patient has a normal gait. Tandem gait is normal. Romberg is negative. No drift is seen.  Reflexes: Deep tendon reflexes are symmetric and normal throughout.   Assessment/Plan:  1. Episodic vertigo  2. Bilateral carpal tunnel syndrome  The patient is doing better with her carpal tunnel syndrome using wrist splints. The patient has had a long duration issue with brief episodic bursts of dizziness and vertigo. This has become more prominent over the last several weeks, the patient had a recent ear infection on the left that required antibiotic therapy. The patient likely has had symptoms from Zoloft use on a chronic basis. This may be exacerbated by the recent ear infection. The patient will be sent for MRI evaluation of the brain. I would expect that the frequency of the episodes of dizziness  would return to the usual baseline within the next several months.  Jill Alexanders MD 10/21/2016 8:03 AM  [facility] [address] [place], [place] [address]  Phone [phone] Fax [phone]

## 2016-10-21 NOTE — Telephone Encounter (Signed)
Patient stated she had a MRI with [place] in 2009 and would like to know if there is anyway Dr. Jannifer Franklin would be able to get the results from that to compare it with the one she is having done here tomorrow July 11 at our [facility] mobile unit.

## 2016-10-21 NOTE — Patient Instructions (Signed)
    We will check MRI of the brain. 

## 2016-10-22 ENCOUNTER — Ambulatory Visit (INDEPENDENT_AMBULATORY_CARE_PROVIDER_SITE_OTHER): Payer: BC Managed Care – PPO

## 2016-10-22 ENCOUNTER — Encounter (HOSPITAL_COMMUNITY)
Admission: RE | Admit: 2016-10-22 | Discharge: 2016-10-22 | Disposition: A | Discharge status: Home / Self Care | Payer: BC Managed Care – PPO | Source: Ambulatory Visit | Attending: Internal Medicine | Admitting: Internal Medicine

## 2016-10-22 DIAGNOSIS — R42 Dizziness and giddiness: Secondary | ICD-10-CM

## 2016-10-23 ENCOUNTER — Encounter (HOSPITAL_COMMUNITY): Payer: BC Managed Care – PPO

## 2016-10-23 ENCOUNTER — Encounter (HOSPITAL_COMMUNITY): Payer: Self-pay

## 2016-10-24 ENCOUNTER — Telehealth: Payer: Self-pay | Admitting: Neurology

## 2016-10-24 NOTE — Telephone Encounter (Signed)
I called the patient. MRI of the brain was normal. The dizziness is likely related to medication effect. The patient is on Zoloft.   MRI brain 10/23/16:  IMPRESSION:  Normal MRI brain (without).

## 2016-10-27 ENCOUNTER — Encounter (HOSPITAL_COMMUNITY): Payer: BC Managed Care – PPO

## 2016-10-27 ENCOUNTER — Encounter (HOSPITAL_COMMUNITY): Payer: Self-pay

## 2016-10-28 ENCOUNTER — Encounter (HOSPITAL_COMMUNITY): Payer: BC Managed Care – PPO

## 2016-10-28 ENCOUNTER — Encounter (HOSPITAL_COMMUNITY): Payer: Self-pay

## 2016-10-30 ENCOUNTER — Encounter (HOSPITAL_COMMUNITY): Payer: BC Managed Care – PPO

## 2016-10-30 ENCOUNTER — Encounter (HOSPITAL_COMMUNITY): Payer: Self-pay

## 2016-11-03 ENCOUNTER — Encounter (HOSPITAL_COMMUNITY)
Admission: RE | Admit: 2016-11-03 | Discharge: 2016-11-03 | Disposition: A | Discharge status: Home / Self Care | Payer: BC Managed Care – PPO | Source: Ambulatory Visit | Attending: Internal Medicine | Admitting: Internal Medicine

## 2016-11-03 ENCOUNTER — Encounter (HOSPITAL_COMMUNITY): Payer: Self-pay

## 2016-11-04 ENCOUNTER — Encounter (HOSPITAL_COMMUNITY): Payer: Self-pay

## 2016-11-04 ENCOUNTER — Encounter (HOSPITAL_COMMUNITY)
Admission: RE | Admit: 2016-11-04 | Discharge: 2016-11-04 | Disposition: A | Discharge status: Home / Self Care | Payer: BC Managed Care – PPO | Source: Ambulatory Visit | Attending: Internal Medicine | Admitting: Internal Medicine

## 2016-11-06 ENCOUNTER — Encounter (HOSPITAL_COMMUNITY): Payer: Self-pay

## 2016-11-06 ENCOUNTER — Encounter (HOSPITAL_COMMUNITY)
Admission: RE | Admit: 2016-11-06 | Discharge: 2016-11-06 | Disposition: A | Discharge status: Home / Self Care | Payer: BC Managed Care – PPO | Source: Ambulatory Visit | Attending: Internal Medicine | Admitting: Internal Medicine

## 2016-11-10 ENCOUNTER — Encounter (HOSPITAL_COMMUNITY): Payer: BC Managed Care – PPO

## 2016-11-10 ENCOUNTER — Encounter (HOSPITAL_COMMUNITY): Payer: Self-pay

## 2016-11-11 ENCOUNTER — Encounter (HOSPITAL_COMMUNITY): Payer: Self-pay

## 2016-11-11 ENCOUNTER — Encounter (HOSPITAL_COMMUNITY)
Admission: RE | Admit: 2016-11-11 | Discharge: 2016-11-11 | Disposition: A | Discharge status: Home / Self Care | Payer: BC Managed Care – PPO | Source: Ambulatory Visit | Attending: Internal Medicine | Admitting: Internal Medicine

## 2016-11-13 ENCOUNTER — Encounter (HOSPITAL_COMMUNITY)
Admission: RE | Admit: 2016-11-13 | Discharge: 2016-11-13 | Disposition: A | Discharge status: Home / Self Care | Payer: BC Managed Care – PPO | Source: Ambulatory Visit | Attending: Internal Medicine | Admitting: Internal Medicine

## 2016-11-13 ENCOUNTER — Encounter (HOSPITAL_COMMUNITY): Payer: Self-pay

## 2016-11-13 DIAGNOSIS — I208 Other forms of angina pectoris: Secondary | ICD-10-CM | POA: Insufficient documentation

## 2016-11-17 ENCOUNTER — Encounter (HOSPITAL_COMMUNITY): Payer: Self-pay

## 2016-11-17 ENCOUNTER — Encounter (HOSPITAL_COMMUNITY): Payer: BC Managed Care – PPO

## 2016-11-18 ENCOUNTER — Encounter (HOSPITAL_COMMUNITY)
Admission: RE | Admit: 2016-11-18 | Discharge: 2016-11-18 | Disposition: A | Discharge status: Home / Self Care | Payer: BC Managed Care – PPO | Source: Ambulatory Visit | Attending: Internal Medicine | Admitting: Internal Medicine

## 2016-11-18 ENCOUNTER — Encounter (HOSPITAL_COMMUNITY): Payer: Self-pay

## 2016-11-20 ENCOUNTER — Encounter (HOSPITAL_COMMUNITY): Payer: Self-pay

## 2016-11-20 ENCOUNTER — Encounter (HOSPITAL_COMMUNITY)
Admission: RE | Admit: 2016-11-20 | Discharge: 2016-11-20 | Disposition: A | Discharge status: Home / Self Care | Payer: BC Managed Care – PPO | Source: Ambulatory Visit | Attending: Internal Medicine | Admitting: Internal Medicine

## 2016-11-24 ENCOUNTER — Encounter (HOSPITAL_COMMUNITY)
Admission: RE | Admit: 2016-11-24 | Discharge: 2016-11-24 | Disposition: A | Discharge status: Home / Self Care | Payer: BC Managed Care – PPO | Source: Ambulatory Visit | Attending: Internal Medicine | Admitting: Internal Medicine

## 2016-11-24 ENCOUNTER — Encounter (HOSPITAL_COMMUNITY): Payer: Self-pay

## 2016-11-25 ENCOUNTER — Encounter (HOSPITAL_COMMUNITY)
Admission: RE | Admit: 2016-11-25 | Discharge: 2016-11-25 | Disposition: A | Discharge status: Home / Self Care | Payer: BC Managed Care – PPO | Source: Ambulatory Visit | Attending: Internal Medicine | Admitting: Internal Medicine

## 2016-11-25 ENCOUNTER — Encounter (HOSPITAL_COMMUNITY): Payer: Self-pay

## 2016-11-27 ENCOUNTER — Encounter (HOSPITAL_COMMUNITY)
Admission: RE | Admit: 2016-11-27 | Discharge: 2016-11-27 | Disposition: A | Discharge status: Home / Self Care | Payer: BC Managed Care – PPO | Source: Ambulatory Visit | Attending: Internal Medicine | Admitting: Internal Medicine

## 2016-11-27 ENCOUNTER — Encounter (HOSPITAL_COMMUNITY): Payer: Self-pay

## 2016-11-28 ENCOUNTER — Encounter: Payer: Self-pay | Admitting: *Deleted

## 2016-12-01 ENCOUNTER — Encounter (HOSPITAL_COMMUNITY)
Admission: RE | Admit: 2016-12-01 | Discharge: 2016-12-01 | Disposition: A | Discharge status: Home / Self Care | Payer: BC Managed Care – PPO | Source: Ambulatory Visit | Attending: Internal Medicine | Admitting: Internal Medicine

## 2016-12-01 ENCOUNTER — Encounter (HOSPITAL_COMMUNITY): Payer: Self-pay

## 2016-12-02 ENCOUNTER — Encounter (HOSPITAL_COMMUNITY)
Admission: RE | Admit: 2016-12-02 | Discharge: 2016-12-02 | Disposition: A | Discharge status: Home / Self Care | Payer: BC Managed Care – PPO | Source: Ambulatory Visit | Attending: Internal Medicine | Admitting: Internal Medicine

## 2016-12-02 ENCOUNTER — Encounter (HOSPITAL_COMMUNITY): Payer: Self-pay

## 2016-12-04 ENCOUNTER — Encounter (HOSPITAL_COMMUNITY): Payer: BC Managed Care – PPO

## 2016-12-04 ENCOUNTER — Encounter (HOSPITAL_COMMUNITY): Payer: Self-pay

## 2016-12-05 ENCOUNTER — Encounter (HOSPITAL_COMMUNITY)
Admission: RE | Admit: 2016-12-05 | Discharge: 2016-12-05 | Disposition: A | Discharge status: Home / Self Care | Payer: BC Managed Care – PPO | Source: Ambulatory Visit | Attending: Internal Medicine | Admitting: Internal Medicine

## 2016-12-08 ENCOUNTER — Encounter (HOSPITAL_COMMUNITY): Payer: Self-pay

## 2016-12-08 ENCOUNTER — Encounter (HOSPITAL_COMMUNITY): Payer: BC Managed Care – PPO

## 2016-12-09 ENCOUNTER — Encounter (HOSPITAL_COMMUNITY): Payer: BC Managed Care – PPO

## 2016-12-09 ENCOUNTER — Encounter (HOSPITAL_COMMUNITY): Payer: Self-pay

## 2016-12-11 ENCOUNTER — Encounter (HOSPITAL_COMMUNITY)
Admission: RE | Admit: 2016-12-11 | Discharge: 2016-12-11 | Disposition: A | Discharge status: Home / Self Care | Payer: BC Managed Care – PPO | Source: Ambulatory Visit | Attending: Internal Medicine | Admitting: Internal Medicine

## 2016-12-11 ENCOUNTER — Encounter (HOSPITAL_COMMUNITY): Payer: Self-pay

## 2016-12-12 ENCOUNTER — Encounter (HOSPITAL_COMMUNITY)
Admission: RE | Admit: 2016-12-12 | Discharge: 2016-12-12 | Disposition: A | Discharge status: Home / Self Care | Payer: BC Managed Care – PPO | Source: Ambulatory Visit | Attending: Internal Medicine | Admitting: Internal Medicine

## 2016-12-16 ENCOUNTER — Encounter (HOSPITAL_COMMUNITY)
Admission: RE | Admit: 2016-12-16 | Discharge: 2016-12-16 | Disposition: A | Discharge status: Home / Self Care | Payer: BC Managed Care – PPO | Source: Ambulatory Visit | Attending: Internal Medicine | Admitting: Internal Medicine

## 2016-12-16 ENCOUNTER — Encounter (HOSPITAL_COMMUNITY): Payer: Self-pay

## 2016-12-16 DIAGNOSIS — I208 Other forms of angina pectoris: Secondary | ICD-10-CM | POA: Diagnosis present

## 2016-12-17 ENCOUNTER — Encounter (HOSPITAL_COMMUNITY)
Admission: RE | Admit: 2016-12-17 | Discharge: 2016-12-17 | Disposition: A | Discharge status: Home / Self Care | Payer: BC Managed Care – PPO | Source: Ambulatory Visit | Attending: Internal Medicine | Admitting: Internal Medicine

## 2016-12-18 ENCOUNTER — Encounter (HOSPITAL_COMMUNITY)
Admission: RE | Admit: 2016-12-18 | Discharge: 2016-12-18 | Disposition: A | Discharge status: Home / Self Care | Payer: BC Managed Care – PPO | Source: Ambulatory Visit | Attending: Internal Medicine | Admitting: Internal Medicine

## 2016-12-18 ENCOUNTER — Encounter (HOSPITAL_COMMUNITY): Payer: Self-pay

## 2016-12-22 ENCOUNTER — Encounter (HOSPITAL_COMMUNITY)
Admission: RE | Admit: 2016-12-22 | Discharge: 2016-12-22 | Disposition: A | Discharge status: Home / Self Care | Payer: BC Managed Care – PPO | Source: Ambulatory Visit | Attending: Internal Medicine | Admitting: Internal Medicine

## 2016-12-22 ENCOUNTER — Encounter (HOSPITAL_COMMUNITY): Payer: Self-pay

## 2016-12-22 ENCOUNTER — Ambulatory Visit (INDEPENDENT_AMBULATORY_CARE_PROVIDER_SITE_OTHER): Payer: BC Managed Care – PPO | Admitting: Internal Medicine

## 2016-12-22 ENCOUNTER — Encounter: Payer: Self-pay | Admitting: Internal Medicine

## 2016-12-22 VITALS — BP 134/77 | HR 92 | Ht 61.5 in | Wt 174.8 lb

## 2016-12-22 DIAGNOSIS — R002 Palpitations: Secondary | ICD-10-CM | POA: Diagnosis not present

## 2016-12-22 DIAGNOSIS — I10 Essential (primary) hypertension: Secondary | ICD-10-CM | POA: Diagnosis not present

## 2016-12-22 DIAGNOSIS — R079 Chest pain, unspecified: Secondary | ICD-10-CM

## 2016-12-22 NOTE — Progress Notes (Signed)
HPI [name] returns today for followup. She is a pleasant 51 yo woman with microvascular CAD, HTN, DM which has been long standing and obesity. She is currently undergoing cardiac rehab.  She is frustrated by her inability to lose weight. She is exercising 3 times a week and notes that she frequently has over 10,000 steps and a day. She denies dietary indiscretion. She complains of chest discomfort that also goes into the back in may last for several days. No nausea, vomiting, or shortness of breath. Allergies  Allergen Reactions  . Pumpkin Flavor Anaphylaxis    Squash also  . Salmon [Fish Allergy] Anaphylaxis  . Crestor [Rosuvastatin Calcium] Other (See Comments)    Muscle pain  . Lipitor [Atorvastatin] Other (See Comments)    Muscle pain  . Sulfonamide Derivatives Other (See Comments)    REACTION: GI distress  . Morphine And Related Rash     Current Outpatient Prescriptions  Medication Sig Dispense Refill  . Ascorbic Acid (VITAMIN C) 500 MG tablet Take 500 mg by mouth daily.      Marland Kitchen aspirin 81 MG tablet Take 81 mg by mouth at bedtime.     . Calcium Carbonate (CALCIUM 500 PO) Take 1,000 mg by mouth daily.     . cetirizine (ZYRTEC) 10 MG tablet Take 10 mg by mouth at bedtime.     . cholecalciferol (VITAMIN D) 1000 units tablet Take 1,000 Units by mouth daily.    . Coenzyme Q10 (CO Q 10) 100 MG CAPS Take 100 mg by mouth daily.     . Cyanocobalamin (VITAMIN B 12) 250 MCG LOZG Take 1 tablet by mouth daily.     Marland Kitchen gabapentin (NEURONTIN) 100 MG capsule Take 1 capsule by mouth daily.    . Insulin Infusion Pump KIT as directed.     Marland Kitchen MAGNESIUM PO Take 1 tablet by mouth daily.     . Multiple Vitamin (MULTIVITAMIN) tablet Take 1 tablet by mouth daily.      . nitroGLYCERIN (NITROSTAT) 0.4 MG SL tablet Place 1 tablet (0.4 mg total) under the tongue every 5 (five) minutes as needed for chest pain. 30 tablet 12  . NOVOLOG 100 UNIT/ML injection as directed.    . pantoprazole (PROTONIX) 40  MG tablet Take 40 mg by mouth See admin instructions. Takes 40 mg every morning, can take a second 40 mg if needed for acid    . Pitavastatin Calcium (LIVALO) 2 MG TABS Take 2 mg by mouth at bedtime.     . ramipril (ALTACE) 2.5 MG capsule Take 2.5 mg by mouth at bedtime.    . sertraline (ZOLOFT) 100 MG tablet Take 100 mg by mouth daily before breakfast.    . verapamil (CALAN-SR) 120 MG CR tablet TAKE 1 TABLET BY MOUTH AT BEDTIME 30 tablet 11  . ZETIA 10 MG tablet TAKE 1 TAB (10 mg) BY MOUTH ONCE DAILY AT BEDTIME  3   No current facility-administered medications for this visit.      Past Medical History:  Diagnosis Date  . Anxiety   . Carpal tunnel syndrome, bilateral 06/02/2016  . Chest pain    RECURRENT WITH NEGITIVE CATHERIZATIONS  . Complication of anesthesia    aspirated and "gained 15pounds, ended up in ICU"  . DM (diabetes mellitus) ([place])   . Dyslipidemia   . Fatty liver   . GERD (gastroesophageal reflux disease)   . History of CT scan    Coronary CTA 3/17: Calcium score  0; no plaque or stenosis noted in coronary arteries  . Hypercholesteremia   . Hypertension   . Migraine   . Obesity   . Pulmonary embolus ([place]) 2002  . Tachycardia    A fib    ROS:   All systems reviewed and negative except as noted in the HPI.   Past Surgical History:  Procedure Laterality Date  . CARDIAC CATHETERIZATION     x2  . CARPAL TUNNEL RELEASE     right  . CHOLECYSTECTOMY  03/24/2012   Procedure: LAPAROSCOPIC CHOLECYSTECTOMY WITH INTRAOPERATIVE CHOLANGIOGRAM;  Surgeon: Edward Jolly, MD;  Location: [facility];  Service: General;  Laterality: N/A;  . ESOPHAGEAL MANOMETRY N/A 05/28/2015   Procedure: ESOPHAGEAL MANOMETRY (EM);  Surgeon: Irene Shipper, MD;  Location: [facility];  Service: Gastroenterology;  Laterality: N/A;  . FIBROID TUMOR     UTERUS SURGERY  . LAPAROSCOPIC HYSTERECTOMY       Family History  Problem Relation Age of Onset  . Irritable bowel syndrome Father   .  Gallbladder disease Mother   . Gallbladder disease Unknown        3 mat. uncles  . Cancer Maternal Grandmother        ovarian  . Colon cancer Neg Hx      Social History   Social History  . Marital status: Single    Spouse name: N/A  . Number of children: 0  . Years of education: Post-grad   Occupational History  . registered dietician     [employer]   Social History Main Topics  . Smoking status: Never Smoker  . Smokeless tobacco: Never Used  . Alcohol use Yes     Comment: rarely  . Drug use: No  . Sexual activity: Not on file   Other Topics Concern  . Not on file   Social History Narrative   Lives at home alone   Right-handed   Caffeine: chocolate     BP 134/77   Pulse 92   Ht 5' 1.5" (1.562 m)   Wt 174 lb 12.8 oz (79.3 kg)   SpO2 100%   BMI 32.49 kg/m   Physical Exam:  Well appearing Obese, middle-aged woman, NAD HEENT: Unremarkable Neck:  6 cm JVD, no thyromegally Lymphatics:  No adenopathy Back:  No CVA tenderness Lungs:  Clear, with no wheezes, rales, or rhonchi HEART:  Regular rate rhythm, no murmurs, no rubs, no clicks Abd:  soft, positive bowel sounds, no organomegally, no rebound, no guarding Ext:  2 plus pulses, no edema, no cyanosis, no clubbing Skin:  No rashes no nodules Neuro:  CN II through XII intact, motor grossly intact  EKG - normal sinus rhythm   Assess/Plan: 1. Chest pain - I'm certain she has microvascular disease. She also has noncardiac chest pain. I've encouraged her to continue exercising. She has nitroglycerin if need be. 2. Dyslipidemia - she will continue her statin therapy. 3. Hypertensive heart disease - her blood pressure today is fairly well controlled. She is encouraged to lose weight and reduce her caloric intake and her sodium intake.  Cristopher Peru, M.D.

## 2016-12-22 NOTE — Patient Instructions (Addendum)

## 2016-12-23 ENCOUNTER — Encounter (HOSPITAL_COMMUNITY): Payer: Self-pay

## 2016-12-23 ENCOUNTER — Encounter (HOSPITAL_COMMUNITY)
Admission: RE | Admit: 2016-12-23 | Discharge: 2016-12-23 | Disposition: A | Discharge status: Home / Self Care | Payer: BC Managed Care – PPO | Source: Ambulatory Visit | Attending: Internal Medicine | Admitting: Internal Medicine

## 2016-12-25 ENCOUNTER — Encounter (HOSPITAL_COMMUNITY): Payer: Self-pay

## 2016-12-25 ENCOUNTER — Encounter (HOSPITAL_COMMUNITY)
Admission: RE | Admit: 2016-12-25 | Discharge: 2016-12-25 | Disposition: A | Discharge status: Home / Self Care | Payer: BC Managed Care – PPO | Source: Ambulatory Visit | Attending: Internal Medicine | Admitting: Internal Medicine

## 2016-12-29 ENCOUNTER — Encounter (HOSPITAL_COMMUNITY): Payer: Self-pay

## 2016-12-29 ENCOUNTER — Encounter (HOSPITAL_COMMUNITY): Payer: BC Managed Care – PPO

## 2016-12-30 ENCOUNTER — Encounter (HOSPITAL_COMMUNITY)
Admission: RE | Admit: 2016-12-30 | Discharge: 2016-12-30 | Disposition: A | Discharge status: Home / Self Care | Payer: BC Managed Care – PPO | Source: Ambulatory Visit | Attending: Internal Medicine | Admitting: Internal Medicine

## 2016-12-30 ENCOUNTER — Encounter (HOSPITAL_COMMUNITY): Payer: Self-pay

## 2016-12-31 ENCOUNTER — Encounter (HOSPITAL_COMMUNITY)
Admission: RE | Admit: 2016-12-31 | Discharge: 2016-12-31 | Disposition: A | Discharge status: Home / Self Care | Payer: BC Managed Care – PPO | Source: Ambulatory Visit | Attending: Internal Medicine | Admitting: Internal Medicine

## 2017-01-01 ENCOUNTER — Encounter (HOSPITAL_COMMUNITY): Payer: Self-pay

## 2017-01-01 ENCOUNTER — Encounter (HOSPITAL_COMMUNITY)
Admission: RE | Admit: 2017-01-01 | Discharge: 2017-01-01 | Disposition: A | Discharge status: Home / Self Care | Payer: BC Managed Care – PPO | Source: Ambulatory Visit | Attending: Internal Medicine | Admitting: Internal Medicine

## 2017-01-05 ENCOUNTER — Encounter (HOSPITAL_COMMUNITY)
Admission: RE | Admit: 2017-01-05 | Discharge: 2017-01-05 | Disposition: A | Discharge status: Home / Self Care | Payer: BC Managed Care – PPO | Source: Ambulatory Visit | Attending: Internal Medicine | Admitting: Internal Medicine

## 2017-01-05 ENCOUNTER — Encounter (HOSPITAL_COMMUNITY): Payer: Self-pay

## 2017-01-06 ENCOUNTER — Encounter (HOSPITAL_COMMUNITY): Payer: Self-pay

## 2017-01-06 ENCOUNTER — Encounter (HOSPITAL_COMMUNITY): Payer: BC Managed Care – PPO

## 2017-01-08 ENCOUNTER — Encounter (HOSPITAL_COMMUNITY): Payer: Self-pay

## 2017-01-08 ENCOUNTER — Encounter (HOSPITAL_COMMUNITY)
Admission: RE | Admit: 2017-01-08 | Discharge: 2017-01-08 | Disposition: A | Discharge status: Home / Self Care | Payer: BC Managed Care – PPO | Source: Ambulatory Visit | Attending: Internal Medicine | Admitting: Internal Medicine

## 2017-01-09 ENCOUNTER — Encounter (HOSPITAL_COMMUNITY)
Admission: RE | Admit: 2017-01-09 | Discharge: 2017-01-09 | Disposition: A | Discharge status: Home / Self Care | Payer: BC Managed Care – PPO | Source: Ambulatory Visit | Attending: Internal Medicine | Admitting: Internal Medicine

## 2017-01-15 ENCOUNTER — Encounter (HOSPITAL_COMMUNITY)
Admission: RE | Admit: 2017-01-15 | Discharge: 2017-01-15 | Disposition: A | Discharge status: Home / Self Care | Payer: Self-pay | Source: Ambulatory Visit | Attending: Internal Medicine | Admitting: Internal Medicine

## 2017-01-15 DIAGNOSIS — I208 Other forms of angina pectoris: Secondary | ICD-10-CM | POA: Insufficient documentation

## 2017-01-19 ENCOUNTER — Encounter (HOSPITAL_COMMUNITY)
Admission: RE | Admit: 2017-01-19 | Discharge: 2017-01-19 | Disposition: A | Discharge status: Home / Self Care | Payer: Self-pay | Source: Ambulatory Visit | Attending: Internal Medicine | Admitting: Internal Medicine

## 2017-01-20 ENCOUNTER — Encounter (HOSPITAL_COMMUNITY)
Admission: RE | Admit: 2017-01-20 | Discharge: 2017-01-20 | Disposition: A | Discharge status: Home / Self Care | Payer: Self-pay | Source: Ambulatory Visit | Attending: Internal Medicine | Admitting: Internal Medicine

## 2017-02-02 ENCOUNTER — Encounter (HOSPITAL_COMMUNITY)
Admission: RE | Admit: 2017-02-02 | Discharge: 2017-02-02 | Disposition: A | Discharge status: Home / Self Care | Payer: Self-pay | Source: Ambulatory Visit | Attending: Internal Medicine | Admitting: Internal Medicine

## 2017-02-17 ENCOUNTER — Encounter (HOSPITAL_COMMUNITY)
Admission: RE | Admit: 2017-02-17 | Discharge: 2017-02-17 | Disposition: A | Discharge status: Home / Self Care | Payer: Self-pay | Source: Ambulatory Visit | Attending: Internal Medicine | Admitting: Internal Medicine

## 2017-02-17 DIAGNOSIS — I208 Other forms of angina pectoris: Secondary | ICD-10-CM | POA: Insufficient documentation

## 2017-02-19 ENCOUNTER — Encounter (HOSPITAL_COMMUNITY)
Admission: RE | Admit: 2017-02-19 | Payer: Self-pay | Source: Ambulatory Visit | Attending: Internal Medicine | Admitting: Internal Medicine

## 2017-02-20 ENCOUNTER — Encounter (HOSPITAL_COMMUNITY)
Admission: RE | Admit: 2017-02-20 | Discharge: 2017-02-20 | Disposition: A | Discharge status: Home / Self Care | Payer: Self-pay | Source: Ambulatory Visit | Attending: Cardiovascular Disease | Admitting: Cardiovascular Disease

## 2017-02-24 ENCOUNTER — Encounter (HOSPITAL_COMMUNITY)
Admission: RE | Admit: 2017-02-24 | Discharge: 2017-02-24 | Disposition: A | Discharge status: Home / Self Care | Payer: Self-pay | Source: Ambulatory Visit | Attending: Internal Medicine | Admitting: Internal Medicine

## 2017-02-25 ENCOUNTER — Encounter (HOSPITAL_COMMUNITY)
Admission: RE | Admit: 2017-02-25 | Discharge: 2017-02-25 | Disposition: A | Discharge status: Home / Self Care | Payer: Self-pay | Source: Ambulatory Visit | Attending: Cardiology | Admitting: Cardiology

## 2017-03-03 ENCOUNTER — Encounter (HOSPITAL_COMMUNITY)
Admission: RE | Admit: 2017-03-03 | Discharge: 2017-03-03 | Disposition: A | Discharge status: Home / Self Care | Payer: Self-pay | Source: Ambulatory Visit | Attending: Cardiology | Admitting: Cardiology

## 2017-03-25 ENCOUNTER — Encounter (HOSPITAL_COMMUNITY): Admission: RE | Admit: 2017-03-25 | Payer: Self-pay | Source: Ambulatory Visit

## 2017-03-25 DIAGNOSIS — I208 Other forms of angina pectoris: Secondary | ICD-10-CM | POA: Insufficient documentation

## 2017-03-31 ENCOUNTER — Encounter (HOSPITAL_COMMUNITY)
Admission: RE | Admit: 2017-03-31 | Discharge: 2017-03-31 | Disposition: A | Discharge status: Home / Self Care | Payer: Self-pay | Source: Ambulatory Visit | Attending: Internal Medicine | Admitting: Internal Medicine

## 2017-04-02 ENCOUNTER — Encounter (HOSPITAL_COMMUNITY)
Admission: RE | Admit: 2017-04-02 | Discharge: 2017-04-02 | Disposition: A | Discharge status: Home / Self Care | Payer: Self-pay | Source: Ambulatory Visit | Attending: Internal Medicine | Admitting: Internal Medicine

## 2017-04-02 ENCOUNTER — Other Ambulatory Visit: Payer: Self-pay | Admitting: *Deleted

## 2017-04-02 MED ORDER — VERAPAMIL HCL ER 120 MG PO TBCR
120.0000 mg | EXTENDED_RELEASE_TABLET | Freq: Every day | ORAL | 2 refills | Status: DC
Start: 2017-04-02 — End: 2017-12-28

## 2017-04-14 DIAGNOSIS — C801 Malignant (primary) neoplasm, unspecified: Secondary | ICD-10-CM

## 2017-04-14 HISTORY — DX: Malignant (primary) neoplasm, unspecified: C80.1

## 2017-04-21 ENCOUNTER — Encounter (HOSPITAL_COMMUNITY)
Admission: RE | Admit: 2017-04-21 | Discharge: 2017-04-21 | Disposition: A | Discharge status: Home / Self Care | Payer: Self-pay | Source: Ambulatory Visit | Attending: Internal Medicine | Admitting: Internal Medicine

## 2017-04-21 DIAGNOSIS — I208 Other forms of angina pectoris: Secondary | ICD-10-CM | POA: Insufficient documentation

## 2017-04-23 ENCOUNTER — Encounter (HOSPITAL_COMMUNITY)
Admission: RE | Admit: 2017-04-23 | Discharge: 2017-04-23 | Disposition: A | Discharge status: Home / Self Care | Payer: Self-pay | Source: Ambulatory Visit | Attending: Internal Medicine | Admitting: Internal Medicine

## 2017-05-11 ENCOUNTER — Encounter (HOSPITAL_COMMUNITY)
Admission: RE | Admit: 2017-05-11 | Discharge: 2017-05-11 | Disposition: A | Discharge status: Home / Self Care | Payer: Self-pay | Source: Ambulatory Visit | Attending: Internal Medicine | Admitting: Internal Medicine

## 2017-05-12 ENCOUNTER — Encounter (HOSPITAL_COMMUNITY)
Admission: RE | Admit: 2017-05-12 | Discharge: 2017-05-12 | Disposition: A | Discharge status: Home / Self Care | Payer: Self-pay | Source: Ambulatory Visit | Attending: Cardiovascular Disease | Admitting: Cardiovascular Disease

## 2017-05-20 ENCOUNTER — Encounter (HOSPITAL_COMMUNITY)
Admission: RE | Admit: 2017-05-20 | Discharge: 2017-05-20 | Disposition: A | Discharge status: Home / Self Care | Payer: Self-pay | Source: Ambulatory Visit | Attending: Internal Medicine | Admitting: Internal Medicine

## 2017-05-20 DIAGNOSIS — I208 Other forms of angina pectoris: Secondary | ICD-10-CM | POA: Insufficient documentation

## 2017-05-21 ENCOUNTER — Ambulatory Visit (HOSPITAL_COMMUNITY)
Admission: EM | Admit: 2017-05-21 | Discharge: 2017-05-21 | Disposition: A | Discharge status: Home / Self Care | Payer: BC Managed Care – PPO | Source: Home / Self Care

## 2017-05-21 ENCOUNTER — Encounter (HOSPITAL_COMMUNITY): Payer: Self-pay | Admitting: Emergency Medicine

## 2017-05-21 ENCOUNTER — Emergency Department (HOSPITAL_COMMUNITY): Payer: BC Managed Care – PPO

## 2017-05-21 DIAGNOSIS — E119 Type 2 diabetes mellitus without complications: Secondary | ICD-10-CM | POA: Insufficient documentation

## 2017-05-21 DIAGNOSIS — Z794 Long term (current) use of insulin: Secondary | ICD-10-CM | POA: Diagnosis not present

## 2017-05-21 DIAGNOSIS — I10 Essential (primary) hypertension: Secondary | ICD-10-CM | POA: Diagnosis not present

## 2017-05-21 DIAGNOSIS — Z79899 Other long term (current) drug therapy: Secondary | ICD-10-CM | POA: Insufficient documentation

## 2017-05-21 DIAGNOSIS — Z9049 Acquired absence of other specified parts of digestive tract: Secondary | ICD-10-CM | POA: Diagnosis not present

## 2017-05-21 DIAGNOSIS — Z7982 Long term (current) use of aspirin: Secondary | ICD-10-CM | POA: Diagnosis not present

## 2017-05-21 DIAGNOSIS — R002 Palpitations: Secondary | ICD-10-CM | POA: Insufficient documentation

## 2017-05-21 DIAGNOSIS — F419 Anxiety disorder, unspecified: Secondary | ICD-10-CM | POA: Insufficient documentation

## 2017-05-21 LAB — CBC
HCT: 36.2 % (ref 36.0–46.0)
Hemoglobin: 12 g/dL (ref 12.0–15.0)
MCH: 29.7 pg (ref 26.0–34.0)
MCHC: 33.1 g/dL (ref 30.0–36.0)
MCV: 89.6 fL (ref 78.0–100.0)
Platelets: 312 K/uL (ref 150–400)
RBC: 4.04 MIL/uL (ref 3.87–5.11)
RDW: 12.9 % (ref 11.5–15.5)
WBC: 5.7 K/uL (ref 4.0–10.5)

## 2017-05-21 LAB — BASIC METABOLIC PANEL
ANION GAP: 10 (ref 5–15)
BUN: 16 mg/dL (ref 6–20)
CALCIUM: 9 mg/dL (ref 8.9–10.3)
CO2: 25 mmol/L (ref 22–32)
Chloride: 103 mmol/L (ref 101–111)
Creatinine, Ser: 0.88 mg/dL (ref 0.44–1.00)
Glucose, Bld: 200 mg/dL — ABNORMAL HIGH (ref 65–99)
Potassium: 4 mmol/L (ref 3.5–5.1)
Sodium: 138 mmol/L (ref 135–145)

## 2017-05-21 LAB — I-STAT TROPONIN, ED: TROPONIN I, POC: 0 ng/mL (ref 0.00–0.08)

## 2017-05-21 NOTE — ED Triage Notes (Signed)
Spoke with pt and due to symptoms and pt hx pt sent to the ER per Dr. Mannie Stabile.

## 2017-05-21 NOTE — ED Triage Notes (Signed)
Pt states she felt palpitations while on a walk earlier today, felt short of breath. Felt like her heart was racing and documented a HR of 140. EKG shows NSR with a rate of 99 at present. Wearing insulin pump, CBG 194.

## 2017-05-22 ENCOUNTER — Encounter (HOSPITAL_COMMUNITY): Payer: Self-pay | Admitting: *Deleted

## 2017-05-22 ENCOUNTER — Encounter: Payer: Self-pay | Admitting: Internal Medicine

## 2017-05-22 ENCOUNTER — Emergency Department (HOSPITAL_COMMUNITY): Payer: BC Managed Care – PPO

## 2017-05-22 ENCOUNTER — Emergency Department (HOSPITAL_COMMUNITY)
Admission: EM | Admit: 2017-05-22 | Discharge: 2017-05-22 | Disposition: A | Discharge status: Home / Self Care | Payer: BC Managed Care – PPO | Attending: Emergency Medicine | Admitting: Emergency Medicine

## 2017-05-22 DIAGNOSIS — R002 Palpitations: Secondary | ICD-10-CM

## 2017-05-22 LAB — I-STAT TROPONIN, ED: Troponin i, poc: 0 ng/mL (ref 0.00–0.08)

## 2017-05-22 MED ORDER — IOPAMIDOL (ISOVUE-370) INJECTION 76%
INTRAVENOUS | Status: AC
  Administered 2017-05-22: 100 mL
  Filled 2017-05-22: qty 100

## 2017-05-22 NOTE — Discharge Instructions (Signed)
Please call and follow up with your primary care provider for further evaluation of your heart palpitation.  Return to the ER if you have any concerns.

## 2017-05-22 NOTE — ED Provider Notes (Signed)
[facility] EMERGENCY DEPARTMENT Provider Note   CSN: [id number] Arrival date & time: 05/21/17  1958     History   Chief Complaint Chief Complaint  Patient presents with  . Chest Pain    HPI Jocelyn [name] is a 52 y.o. female.  HPI   52 year old female with prior history of PE, atrial fibrillation, diabetes, anxiety presenting complaining of heart palpitation.  Patient report yesterday she was doing her normal walk.  15 minutes into her walk she developed shortness of breath and heart palpitation.  She continues walking for an additional 10 minutes back home and check her vital sign.  States that her heart rate was at 140s and sustained prompting her to come to the ER.  While waiting in the ER, her heart rate and shortness of breath has since resolved.  She did report some mild chest discomfort but no significant chest pain.  She is back to her normal baseline.  She denies any cold symptoms, they report some mild nausea without vomiting or diarrhea, no diaphoresis, no focal numbness or weakness.  Denies any leg swelling or calf pain.  Last time that she had a PE was in 2002.  She also has a nuclear stress test of her heart 2 years ago and that was normal.  She is not on any blood thinner medication.  She denies any increased anxiety.  She denies any recent travel, prolonged bed rest, recent surgery, or active cancer.  She is not on the oral hormones.  Past Medical History:  Diagnosis Date  . Anxiety   . Carpal tunnel syndrome, bilateral 06/02/2016  . Chest pain    RECURRENT WITH NEGITIVE CATHERIZATIONS  . Complication of anesthesia    aspirated and "gained 15pounds, ended up in ICU"  . DM (diabetes mellitus) ([place])   . Dyslipidemia   . Fatty liver   . GERD (gastroesophageal reflux disease)   . History of CT scan    Coronary CTA 3/17: Calcium score 0; no plaque or stenosis noted in coronary arteries  . Hypercholesteremia   . Hypertension   . Migraine   . Obesity    . Pulmonary embolus ([place]) 2002  . Tachycardia    A fib    Patient Active Problem List   Diagnosis Date Noted  . Vertigo 10/21/2016  . Carpal tunnel syndrome, bilateral 06/02/2016  . Paresthesia 04/10/2016  . Palpitations 12/22/2015  . Atypical chest pain   . Pain in the chest 04/04/2015  . Diabetes mellitus type 1 ([place]) 04/04/2015  . Insulin pump in place 04/04/2015  . Chest pressure 05/19/2012  . DYSPNEA 01/21/2010  . DIAB W/O COMP TYPE I [JUV] NOT STATED UNCNTRL 07/09/2009  . GERD 07/09/2009  . ABDOMINAL PAIN-EPIGASTRIC 07/09/2009  . Hypercholesteremia 02/08/2009  . Essential hypertension, benign 02/08/2009  . UNSPECIFIED PAROXYSMAL TACHYCARDIA 02/08/2009    Past Surgical History:  Procedure Laterality Date  . CARDIAC CATHETERIZATION     x2  . CARPAL TUNNEL RELEASE     right  . CHOLECYSTECTOMY  03/24/2012   Procedure: LAPAROSCOPIC CHOLECYSTECTOMY WITH INTRAOPERATIVE CHOLANGIOGRAM;  Surgeon: Edward Jolly, MD;  Location: [facility];  Service: General;  Laterality: N/A;  . ESOPHAGEAL MANOMETRY N/A 05/28/2015   Procedure: ESOPHAGEAL MANOMETRY (EM);  Surgeon: Irene Shipper, MD;  Location: [facility];  Service: Gastroenterology;  Laterality: N/A;  . FIBROID TUMOR     UTERUS SURGERY  . LAPAROSCOPIC HYSTERECTOMY      OB History    No data  available       Home Medications    Prior to Admission medications   Medication Sig Start Date End Date Taking? Authorizing Provider  Ascorbic Acid (VITAMIN C) 500 MG tablet Take 500 mg by mouth daily.      [provider]  aspirin 81 MG tablet Take 81 mg by mouth at bedtime.     [provider]  Calcium Carbonate (CALCIUM 500 PO) Take 1,000 mg by mouth daily.     [provider]  cetirizine (ZYRTEC) 10 MG tablet Take 10 mg by mouth at bedtime.     [provider]  cholecalciferol (VITAMIN D) 1000 units tablet Take 1,000 Units by mouth daily.    [provider]  Coenzyme Q10 (CO Q 10)  100 MG CAPS Take 100 mg by mouth daily.     [provider]  Cyanocobalamin (VITAMIN B 12) 250 MCG LOZG Take 1 tablet by mouth daily.     [provider]  gabapentin (NEURONTIN) 100 MG capsule Take 1 capsule by mouth daily. 03/10/16   [provider]  Insulin Infusion Pump KIT as directed.     [provider]  MAGNESIUM PO Take 1 tablet by mouth daily.     [provider]  Multiple Vitamin (MULTIVITAMIN) tablet Take 1 tablet by mouth daily.      [provider]  nitroGLYCERIN (NITROSTAT) 0.4 MG SL tablet Place 1 tablet (0.4 mg total) under the tongue every 5 (five) minutes as needed for chest pain. 04/05/15   Rai, Ripudeep K, MD  NOVOLOG 100 UNIT/ML injection as directed. 03/10/16   [provider]  pantoprazole (PROTONIX) 40 MG tablet Take 40 mg by mouth See admin instructions. Takes 40 mg every morning, can take a second 40 mg if needed for acid    [provider]  Pitavastatin Calcium (LIVALO) 2 MG TABS Take 2 mg by mouth at bedtime.     [provider]  ramipril (ALTACE) 2.5 MG capsule Take 2.5 mg by mouth at bedtime.    [provider]  sertraline (ZOLOFT) 100 MG tablet Take 100 mg by mouth daily before breakfast.    [provider]  verapamil (CALAN-SR) 120 MG CR tablet Take 1 tablet (120 mg total) by mouth at bedtime. 04/02/17   Evans Lance, MD  ZETIA 10 MG tablet TAKE 1 TAB (10 mg) BY MOUTH ONCE DAILY AT BEDTIME 10/12/14   [provider]    Family History Family History  Problem Relation Age of Onset  . Irritable bowel syndrome Father   . Gallbladder disease Mother   . Gallbladder disease Unknown        3 mat. uncles  . Cancer Maternal Grandmother        ovarian  . Colon cancer Neg Hx     Social History Social History   Tobacco Use  . Smoking status: Never Smoker  . Smokeless tobacco: Never Used  Substance Use Topics  . Alcohol use: Yes    Comment: rarely  .  Drug use: No     Allergies   Pumpkin flavor; Salmon [fish allergy]; Crestor [rosuvastatin calcium]; Lipitor [atorvastatin]; Sulfonamide derivatives; and Morphine and related   Review of Systems Review of Systems  All other systems reviewed and are negative.    Physical Exam Updated Vital Signs BP (!) 148/79 (BP Location: Right Arm)   Pulse 76   Temp 97.9 F (36.6 C) (Oral)   Resp 18   SpO2  100%   Physical Exam  Constitutional: She appears well-developed and well-nourished. No distress.  HENT:  Head: Atraumatic.  Eyes: Conjunctivae are normal.  Neck: Neck supple.  Cardiovascular: Normal rate, regular rhythm, intact distal pulses and normal pulses. Exam reveals no friction rub.  No murmur heard. Pulmonary/Chest: Effort normal and breath sounds normal. She has no decreased breath sounds.  Abdominal: Soft. There is no tenderness.  Musculoskeletal: Normal range of motion.       Right lower leg: She exhibits no edema.       Left lower leg: She exhibits no edema.  Neurological: She is alert.  Skin: Capillary refill takes less than 2 seconds. No rash noted.  Psychiatric: She has a normal mood and affect.  Nursing note and vitals reviewed.    ED Treatments / Results  Labs (all labs ordered are listed, but only abnormal results are displayed) Labs Reviewed  BASIC METABOLIC PANEL - Abnormal; Notable for the following components:      Result Value   Glucose, Bld 200 (*)    All other components within normal limits  CBC  I-STAT TROPONIN, ED  I-STAT TROPONIN, ED    EKG  EKG Interpretation None      Date: 05/22/2017  Rate: 99  Rhythm: normal sinus rhythm  QRS Axis: normal  Intervals: normal  ST/T Wave abnormalities: normal  Conduction Disutrbances: none  Narrative Interpretation:   Old EKG Reviewed: No significant changes noted     Radiology Dg Chest 2 View  Result Date: 05/21/2017 CLINICAL DATA:  Chest pain EXAM: CHEST  2 VIEW COMPARISON:  12/21/2015  FINDINGS: The heart size and mediastinal contours are within normal limits. Both lungs are clear. Degenerative changes of the spine. Surgical clips in the right upper quadrant. IMPRESSION: No active cardiopulmonary disease. Electronically Signed   By: Donavan Foil M.D.   On: 05/21/2017 21:26   Ct Angio Chest Pe W And/or Wo Contrast  Result Date: 05/22/2017 CLINICAL DATA:  Tachycardia and shortness of Breath EXAM: CT ANGIOGRAPHY CHEST WITH CONTRAST TECHNIQUE: Multidetector CT imaging of the chest was performed using the standard protocol during bolus administration of intravenous contrast. Multiplanar CT image reconstructions and MIPs were obtained to evaluate the vascular anatomy. CONTRAST:  142m ISOVUE-370 IOPAMIDOL (ISOVUE-370) INJECTION 76% COMPARISON:  05/21/2017 FINDINGS: Cardiovascular: Thoracic aorta and its branches are well visualized. No aneurysmal dilatation or dissection is seen. No cardiac enlargement is noted. No coronary calcifications are seen. The pulmonary artery is well visualized with a normal branching pattern. No filling defect to suggest pulmonary embolism is identified. Mediastinum/Nodes: Thoracic inlet is within normal limits. The esophagus is unremarkable. No hilar or mediastinal adenopathy is seen. Lungs/Pleura: Lungs are well aerated bilaterally. No focal infiltrate or sizable effusion is seen. Upper Abdomen: No acute abnormality. Musculoskeletal: Degenerative changes of the thoracic spine are noted. Review of the MIP images confirms the above findings. IMPRESSION: No evidence of pulmonary emboli.  No acute abnormality seen. Electronically Signed   By: MInez CatalinaM.D.   On: 05/22/2017 09:42    Procedures Procedures (including critical care time)  Medications Ordered in ED Medications  iopamidol (ISOVUE-370) 76 % injection (100 mLs  Contrast Given 05/22/17 0847)     Initial Impression / Assessment and Plan / ED Course  I have reviewed the triage vital signs and the  nursing notes.  Pertinent labs & imaging results that were available during my care of the patient were reviewed by me and considered in my medical decision making (  see chart for details).     BP (!) 148/79 (BP Location: Right Arm)   Pulse 76   Temp 97.9 F (36.6 C) (Oral)   Resp 18   SpO2 100%    Final Clinical Impressions(s) / ED Diagnoses   Final diagnoses:  Heart palpitations    ED Discharge Orders    None     8:15 AM Patient with remote history of PE, here with acute onset of shortness of breath and heart palpitation.  Currently she is in no acute discomfort.  Workup so far has been unremarkable.  Negative delta troponin level low suspicion for ACS.  EKG without concerning arrhythmia or ischemic changes.  Chest x-ray unremarkable.  Due to her prior PE, will obtain chest CT angiogram to rule out PE.  9:59 AM Chest CT angiogram shows no evidence of PE or dissection.  Patient is resting comfortably.  Recommend patient to follow-up closely with her primary care provider for further management.  She may benefit from Holter monitoring to assess her heart palpitation.  Return precautions discussed.   Domenic Moras, PA-C 05/22/17 1000    Jola Schmidt, MD 05/24/17 [phone]

## 2017-05-22 NOTE — ED Notes (Signed)
Pt resting at bedside. No distress. A/Ox4.

## 2017-05-28 ENCOUNTER — Encounter: Payer: Self-pay | Admitting: Neurology

## 2017-05-29 ENCOUNTER — Ambulatory Visit
Admission: RE | Admit: 2017-05-29 | Discharge: 2017-05-29 | Disposition: A | Discharge status: Home / Self Care | Payer: BC Managed Care – PPO | Source: Ambulatory Visit | Attending: Internal Medicine | Admitting: Internal Medicine

## 2017-05-29 ENCOUNTER — Other Ambulatory Visit: Payer: Self-pay | Admitting: Internal Medicine

## 2017-05-29 DIAGNOSIS — R1031 Right lower quadrant pain: Secondary | ICD-10-CM

## 2017-05-29 DIAGNOSIS — R109 Unspecified abdominal pain: Secondary | ICD-10-CM

## 2017-06-22 ENCOUNTER — Encounter (HOSPITAL_COMMUNITY): Admission: RE | Admit: 2017-06-22 | Payer: Self-pay | Source: Ambulatory Visit

## 2017-06-22 DIAGNOSIS — I208 Other forms of angina pectoris: Secondary | ICD-10-CM | POA: Insufficient documentation

## 2017-06-23 ENCOUNTER — Encounter (HOSPITAL_COMMUNITY)
Admission: RE | Admit: 2017-06-23 | Discharge: 2017-06-23 | Disposition: A | Discharge status: Home / Self Care | Payer: Self-pay | Source: Ambulatory Visit | Attending: Internal Medicine | Admitting: Internal Medicine

## 2017-06-30 ENCOUNTER — Ambulatory Visit: Payer: BC Managed Care – PPO | Admitting: Internal Medicine

## 2017-06-30 ENCOUNTER — Encounter: Payer: Self-pay | Admitting: Internal Medicine

## 2017-06-30 VITALS — BP 100/70 | HR 92 | Ht 61.5 in | Wt 174.4 lb

## 2017-06-30 DIAGNOSIS — R002 Palpitations: Secondary | ICD-10-CM

## 2017-06-30 DIAGNOSIS — R079 Chest pain, unspecified: Secondary | ICD-10-CM

## 2017-06-30 MED ORDER — NITROGLYCERIN 0.4 MG SL SUBL: 0.4000 mg | SUBLINGUAL_TABLET | SUBLINGUAL | 3 refills | Status: DC | PRN

## 2017-06-30 MED ORDER — VERAPAMIL HCL 40 MG PO TABS: 40.0000 mg | ORAL_TABLET | Freq: Once | ORAL | 1 refills | Status: DC | PRN

## 2017-06-30 NOTE — Progress Notes (Signed)
HPI [name] returns today for ongoing evaluation and management of palpitations. She has a h/o autonomic dysfunction and has recently been participating in cardiac rehab. She was in her neghborhood about a month ago walking and her Fit bit HR was in the 150 range. After 30 minutes it went down to 130. She called the office and was sent to the ED where she stayed for 8 hours. She had a chest CT. Negative. She feels mostly well. 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  Allergies  Allergen Reactions  . Pumpkin Flavor Anaphylaxis    Squash also  . Salmon [Fish Allergy] Anaphylaxis  . Crestor [Rosuvastatin Calcium] Other (See Comments)    Muscle pain  . Lipitor [Atorvastatin] Other (See Comments)    Muscle pain  . Sulfadoxine   . Sulfonamide Derivatives Other (See Comments)    REACTION: GI distress  . Morphine And Related Rash     Current Outpatient Medications  Medication Sig Dispense Refill  . Ascorbic Acid (VITAMIN C) 500 MG tablet Take 500 mg by mouth daily.      Marland Kitchen aspirin 81 MG tablet Take 81 mg by mouth at bedtime.     . Calcium Carbonate (CALCIUM 500 PO) Take 1,000 mg by mouth daily.     . cetirizine (ZYRTEC) 10 MG tablet Take 10 mg by mouth at bedtime.     . cholecalciferol (VITAMIN D) 1000 units tablet Take 1,000 Units by mouth daily.    . Coenzyme Q10 (CO Q 10) 100 MG CAPS Take 100 mg by mouth daily.     . Cyanocobalamin (VITAMIN B 12) 250 MCG LOZG Take 1 tablet by mouth daily.     Marland Kitchen gabapentin (NEURONTIN) 100 MG capsule Take 1 capsule by mouth daily.    . Insulin Infusion Pump KIT as directed.     Marland Kitchen MAGNESIUM PO Take 1 tablet by mouth daily.     . Multiple Vitamin (MULTIVITAMIN) tablet Take 1 tablet by mouth daily.      . nitroGLYCERIN (NITROSTAT) 0.4 MG SL tablet Place 1 tablet (0.4 mg total) under the tongue every 5 (five) minutes as needed for chest pain. 30 tablet 12  . NOVOLOG 100 UNIT/ML injection as directed.    . pantoprazole (PROTONIX) 40 MG  tablet Take 40 mg by mouth See admin instructions. Takes 40 mg every morning, can take a second 40 mg if needed for acid    . Pitavastatin Calcium (LIVALO) 2 MG TABS Take 2 mg by mouth at bedtime.     . ramipril (ALTACE) 2.5 MG capsule Take 2.5 mg by mouth at bedtime.    . sertraline (ZOLOFT) 100 MG tablet Take 100 mg by mouth daily before breakfast.    . verapamil (CALAN-SR) 120 MG CR tablet Take 1 tablet (120 mg total) by mouth at bedtime. 90 tablet 2  . ZETIA 10 MG tablet TAKE 1 TAB (10 mg) BY MOUTH ONCE DAILY AT BEDTIME  3   No current facility-administered medications for this visit.      Past Medical History:  Diagnosis Date  . Anxiety   . Carpal tunnel syndrome, bilateral 06/02/2016  . Chest pain    RECURRENT WITH NEGITIVE CATHERIZATIONS  . Complication of anesthesia    aspirated and "gained 15pounds, ended up in ICU"  . DM (diabetes mellitus) ([place])   . Dyslipidemia   . Fatty liver   . GERD (gastroesophageal reflux disease)   . History of  CT scan    Coronary CTA 3/17: Calcium score 0; no plaque or stenosis noted in coronary arteries  . Hypercholesteremia   . Hypertension   . Migraine   . Obesity   . Pulmonary embolus ([place]) 2002  . Tachycardia    A fib    ROS:   All systems reviewed and negative except as noted in the HPI.   Past Surgical History:  Procedure Laterality Date  . CARDIAC CATHETERIZATION     x2  . CARPAL TUNNEL RELEASE     right  . CHOLECYSTECTOMY  03/24/2012   Procedure: LAPAROSCOPIC CHOLECYSTECTOMY WITH INTRAOPERATIVE CHOLANGIOGRAM;  Surgeon: Edward Jolly, MD;  Location: [facility];  Service: General;  Laterality: N/A;  . ESOPHAGEAL MANOMETRY N/A 05/28/2015   Procedure: ESOPHAGEAL MANOMETRY (EM);  Surgeon: Irene Shipper, MD;  Location: [facility];  Service: Gastroenterology;  Laterality: N/A;  . FIBROID TUMOR     UTERUS SURGERY  . LAPAROSCOPIC HYSTERECTOMY       Family History  Problem Relation Age of Onset  . Irritable bowel syndrome  Father   . Gallbladder disease Mother   . Gallbladder disease Unknown        3 mat. uncles  . Cancer Maternal Grandmother        ovarian  . Colon cancer Neg Hx      Social History   Socioeconomic History  . Marital status: Single    Spouse name: Not on file  . Number of children: 0  . Years of education: Post-grad  . Highest education level: Not on file  Social Needs  . Financial resource strain: Not on file  . Food insecurity - worry: Not on file  . Food insecurity - inability: Not on file  . Transportation needs - medical: Not on file  . Transportation needs - non-medical: Not on file  Occupational History  . Occupation: registered dietician    Comment: [employer]  Tobacco Use  . Smoking status: Never Smoker  . Smokeless tobacco: Never Used  Substance and Sexual Activity  . Alcohol use: Yes    Comment: rarely  . Drug use: No  . Sexual activity: Not on file  Other Topics Concern  . Not on file  Social History Narrative   Lives at home alone   Right-handed   Caffeine: chocolate     BP 100/70   Pulse 92   Ht 5' 1.5" (1.562 m)   Wt 174 lb 6.4 oz (79.1 kg)   BMI 32.42 kg/m   Physical Exam:  Well appearing 52 yo woman, NAD HEENT: Unremarkable Neck:  6 cm JVD, no thyromegally Lymphatics:  No adenopathy Back:  No CVA tenderness Lungs:  Clear with no wheezes HEART:  Regular rate rhythm, no murmurs, no rubs, no clicks Abd:  soft, positive bowel sounds, no organomegally, no rebound, no guarding Ext:  2 plus pulses, no edema, no cyanosis, no clubbing Skin:  No rashes no nodules Neuro:  CN II through XII intact, motor grossly intact  EKG - nsr  Assess/Plan: 1. Palpitations - her episode could have been due to atrial tachycardia but could also be sinus tachy due to autonomic dysfunction. I have given her a new prescription for short acting verapamil. I will see her back in 6 months. She will try and reduce her caffeine use. 2. Atypical chest pain -  She is asymptomatic and her chest pain is non-exertional. 3. Dyslipidemia - she will continue her Pitavastatin.   Mikle Bosworth.D.

## 2017-06-30 NOTE — Patient Instructions (Addendum)
Medication Instructions:  Your physician has recommended you make the following change in your medication:  1.  Take verapamil short acting 40 mg one tablet by mouth as needed for heart racing.  Labwork: None ordered.  Testing/Procedures: None ordered.  Follow-Up: Your physician wants you to follow-up in: 6 months with Dr. Lovena Le.  You will receive a reminder letter in the mail two months in advance. If you don't receive a letter, please call our office to schedule the follow-up appointment.  Any Other Special Instructions Will Be Listed Below (If Applicable).  If you need a refill on your cardiac medications before your next appointment, please call your pharmacy.

## 2017-07-28 ENCOUNTER — Encounter (HOSPITAL_COMMUNITY): Payer: Self-pay

## 2017-07-28 DIAGNOSIS — I208 Other forms of angina pectoris: Secondary | ICD-10-CM | POA: Insufficient documentation

## 2017-07-30 ENCOUNTER — Encounter (HOSPITAL_COMMUNITY): Payer: Self-pay

## 2017-08-03 ENCOUNTER — Encounter (HOSPITAL_COMMUNITY): Payer: Self-pay

## 2017-08-04 ENCOUNTER — Encounter (HOSPITAL_COMMUNITY): Payer: Self-pay

## 2017-08-06 ENCOUNTER — Encounter (HOSPITAL_COMMUNITY): Payer: Self-pay

## 2017-08-10 ENCOUNTER — Encounter (HOSPITAL_COMMUNITY)
Admission: RE | Admit: 2017-08-10 | Discharge: 2017-08-10 | Disposition: A | Discharge status: Home / Self Care | Payer: Self-pay | Source: Ambulatory Visit | Attending: Internal Medicine | Admitting: Internal Medicine

## 2017-08-11 ENCOUNTER — Encounter (HOSPITAL_COMMUNITY): Payer: Self-pay

## 2017-08-13 ENCOUNTER — Encounter (HOSPITAL_COMMUNITY): Payer: Self-pay | Attending: Internal Medicine

## 2017-08-13 DIAGNOSIS — I208 Other forms of angina pectoris: Secondary | ICD-10-CM | POA: Insufficient documentation

## 2017-08-17 ENCOUNTER — Encounter (HOSPITAL_COMMUNITY): Payer: Self-pay

## 2017-08-18 ENCOUNTER — Encounter (HOSPITAL_COMMUNITY): Payer: Self-pay

## 2017-08-20 ENCOUNTER — Encounter (HOSPITAL_COMMUNITY): Payer: Self-pay

## 2017-08-24 ENCOUNTER — Encounter (HOSPITAL_COMMUNITY): Payer: Self-pay

## 2017-08-25 ENCOUNTER — Encounter (HOSPITAL_COMMUNITY): Payer: Self-pay

## 2017-08-27 ENCOUNTER — Encounter (HOSPITAL_COMMUNITY): Payer: Self-pay

## 2017-08-31 ENCOUNTER — Encounter (HOSPITAL_COMMUNITY): Payer: Self-pay

## 2017-09-01 ENCOUNTER — Encounter (HOSPITAL_COMMUNITY): Admission: RE | Admit: 2017-09-01 | Payer: Self-pay | Source: Ambulatory Visit

## 2017-09-03 ENCOUNTER — Encounter (HOSPITAL_COMMUNITY): Payer: Self-pay

## 2017-09-08 ENCOUNTER — Encounter (HOSPITAL_COMMUNITY): Payer: Self-pay

## 2017-09-10 ENCOUNTER — Encounter (HOSPITAL_COMMUNITY): Payer: Self-pay

## 2017-09-14 ENCOUNTER — Encounter (HOSPITAL_COMMUNITY): Payer: Self-pay | Attending: Internal Medicine

## 2017-09-14 DIAGNOSIS — I208 Other forms of angina pectoris: Secondary | ICD-10-CM | POA: Insufficient documentation

## 2017-09-15 ENCOUNTER — Encounter (HOSPITAL_COMMUNITY): Payer: Self-pay

## 2017-09-17 ENCOUNTER — Encounter (HOSPITAL_COMMUNITY): Payer: Self-pay

## 2017-09-21 ENCOUNTER — Encounter (HOSPITAL_COMMUNITY): Payer: Self-pay

## 2017-09-22 ENCOUNTER — Encounter (HOSPITAL_COMMUNITY): Payer: Self-pay

## 2017-09-24 ENCOUNTER — Encounter (HOSPITAL_COMMUNITY): Payer: Self-pay

## 2017-09-28 ENCOUNTER — Encounter (HOSPITAL_COMMUNITY): Payer: Self-pay

## 2017-09-29 ENCOUNTER — Encounter (HOSPITAL_COMMUNITY): Payer: Self-pay

## 2017-10-01 ENCOUNTER — Encounter (HOSPITAL_COMMUNITY): Payer: Self-pay

## 2017-10-05 ENCOUNTER — Encounter (HOSPITAL_COMMUNITY): Payer: Self-pay

## 2017-10-06 ENCOUNTER — Encounter (HOSPITAL_COMMUNITY): Payer: Self-pay

## 2017-10-08 ENCOUNTER — Encounter (HOSPITAL_COMMUNITY): Payer: Self-pay

## 2017-10-12 ENCOUNTER — Encounter (HOSPITAL_COMMUNITY): Payer: Self-pay | Attending: Internal Medicine

## 2017-10-12 DIAGNOSIS — I208 Other forms of angina pectoris: Secondary | ICD-10-CM | POA: Insufficient documentation

## 2017-10-13 ENCOUNTER — Encounter (HOSPITAL_COMMUNITY): Payer: Self-pay

## 2017-10-19 ENCOUNTER — Encounter (HOSPITAL_COMMUNITY): Payer: Self-pay

## 2017-10-20 ENCOUNTER — Encounter (HOSPITAL_COMMUNITY): Payer: Self-pay

## 2017-10-22 ENCOUNTER — Encounter (HOSPITAL_COMMUNITY): Payer: Self-pay

## 2017-10-24 ENCOUNTER — Other Ambulatory Visit: Payer: Self-pay | Admitting: Internal Medicine

## 2017-10-26 ENCOUNTER — Encounter (HOSPITAL_COMMUNITY): Payer: Self-pay

## 2017-10-27 ENCOUNTER — Encounter (HOSPITAL_COMMUNITY): Payer: Self-pay

## 2017-10-29 ENCOUNTER — Encounter (HOSPITAL_COMMUNITY): Payer: Self-pay

## 2017-11-02 ENCOUNTER — Encounter (HOSPITAL_COMMUNITY): Payer: Self-pay

## 2017-11-03 ENCOUNTER — Encounter (HOSPITAL_COMMUNITY): Payer: Self-pay

## 2017-11-05 ENCOUNTER — Encounter (HOSPITAL_COMMUNITY): Payer: Self-pay

## 2017-11-09 ENCOUNTER — Encounter (HOSPITAL_COMMUNITY): Payer: Self-pay

## 2017-11-10 ENCOUNTER — Encounter (HOSPITAL_COMMUNITY): Payer: Self-pay

## 2017-11-12 ENCOUNTER — Encounter (HOSPITAL_COMMUNITY): Payer: Self-pay

## 2017-11-12 DIAGNOSIS — I208 Other forms of angina pectoris: Secondary | ICD-10-CM | POA: Insufficient documentation

## 2017-11-16 ENCOUNTER — Encounter (HOSPITAL_COMMUNITY): Payer: Self-pay

## 2017-11-17 ENCOUNTER — Encounter (HOSPITAL_COMMUNITY): Payer: Self-pay

## 2017-11-19 ENCOUNTER — Encounter (HOSPITAL_COMMUNITY): Payer: Self-pay

## 2017-11-23 ENCOUNTER — Encounter (HOSPITAL_COMMUNITY): Payer: Self-pay

## 2017-11-24 ENCOUNTER — Encounter (HOSPITAL_COMMUNITY): Payer: Self-pay

## 2017-11-24 IMAGING — CT CT HEART MORP W/ CTA COR W/ SCORE W/ CA W/CM &/OR W/O CM
1 of 10 series · 1 of 20 positions shown, 2 images · non-contrast
Comparison: 04/04/2015

CLINICAL DATA: Chest pain

EXAM:
Cardiac CTA
MEDICATIONS:
Sub lingual nitro. 4mg and lopressor 15mg IV total
TECHNIQUE: The patient was scanned on a [employer] [REDACTED]ice scanner. Gantry
rotation speed was 270 msecs. Collimation was .9mm. A 100 kV
prospective scan was triggered in the descending thoracic aorta at
111 HU's with 5% padding centered around 78% of the R-R interval.
Average HR during the scan was 70 bpm. The 3D data set was
interpreted on a dedicated work station using MPR, MIP and VRT
modes. A total of 80cc of contrast was used.

[Series 300: locator · axial · 0.35mm/px · z∈[-126,-126]mm · 1 of 1 slices shown, 2 images]
[im 1/1  vessel]
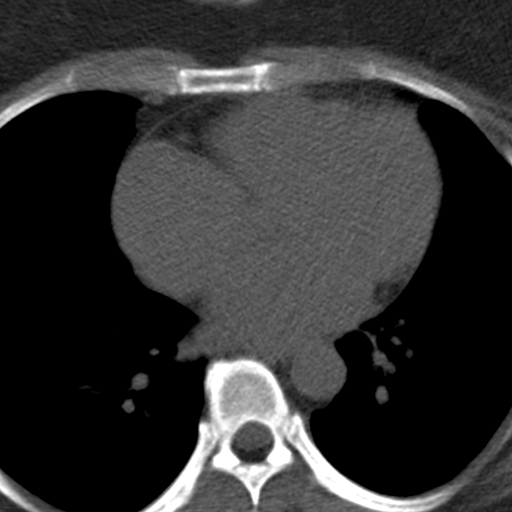
[im 1/1  lung]
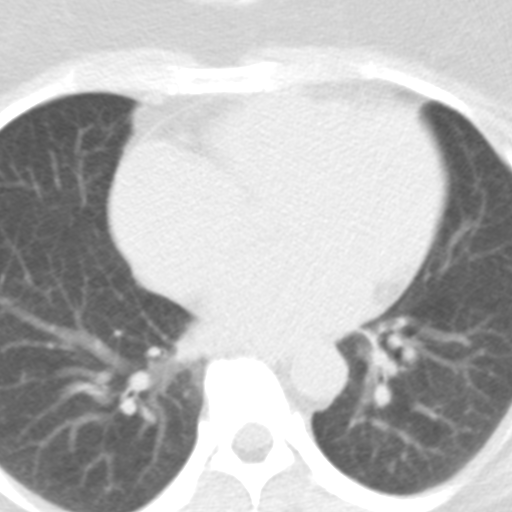

[1 of 20 positions shown; findings below may reference images not displayed]

FINDINGS: Non-cardiac: See separate report from [REDACTED].

Calcium Score:  0 Agatston units

Coronary Arteries: Right dominant with no anomalies

LM:  No plaque or stenosis.

LAD system:  No plaque or stenosis.

Circumflex system:  No plaque or stenosis.

RCA:  No plaque or stenosis.
IMPRESSION: 1. Coronary artery calcium score of 0 Agatston units, suggesting low
risk of future cardiac events.

2.  No plaque or stenosis noted in the coronary arteries.

Zari Hansel

EXAM:
OVER-READ INTERPRETATION  CT CHEST

The following report is an over-read performed by radiologist Dr.
over-read does not include interpretation of cardiac or coronary
anatomy or pathology. The coronary CTA interpretation by the
cardiologist is attached.
FINDINGS: Mediastinum/Nodes: Unremarkable

Lungs/Pleura: Unremarkable

Upper abdomen: Unremarkable

Musculoskeletal: Mild to moderate thoracic spondylosis.
IMPRESSION: 1. Mild to moderate thoracic spondylosis. Otherwise no significant
abnormal extracardiac findings.

## 2017-11-26 ENCOUNTER — Encounter (HOSPITAL_COMMUNITY): Payer: Self-pay

## 2017-11-30 ENCOUNTER — Encounter (HOSPITAL_COMMUNITY): Payer: Self-pay

## 2017-12-01 ENCOUNTER — Encounter (HOSPITAL_COMMUNITY): Payer: Self-pay

## 2017-12-02 ENCOUNTER — Encounter (HOSPITAL_COMMUNITY)
Admission: RE | Admit: 2017-12-02 | Discharge: 2017-12-02 | Disposition: A | Discharge status: Home / Self Care | Payer: Self-pay | Source: Ambulatory Visit | Attending: Internal Medicine | Admitting: Internal Medicine

## 2017-12-03 ENCOUNTER — Encounter (HOSPITAL_COMMUNITY): Payer: Self-pay

## 2017-12-07 ENCOUNTER — Encounter (HOSPITAL_COMMUNITY): Payer: Self-pay

## 2017-12-08 ENCOUNTER — Encounter (HOSPITAL_COMMUNITY): Payer: Self-pay

## 2017-12-09 ENCOUNTER — Encounter (HOSPITAL_COMMUNITY)
Admission: RE | Admit: 2017-12-09 | Discharge: 2017-12-09 | Disposition: A | Discharge status: Home / Self Care | Payer: Self-pay | Source: Ambulatory Visit | Attending: Internal Medicine | Admitting: Internal Medicine

## 2017-12-10 ENCOUNTER — Encounter (HOSPITAL_COMMUNITY): Payer: Self-pay

## 2017-12-15 ENCOUNTER — Encounter (HOSPITAL_COMMUNITY): Payer: BC Managed Care – PPO | Attending: Internal Medicine

## 2017-12-15 DIAGNOSIS — I208 Other forms of angina pectoris: Secondary | ICD-10-CM | POA: Insufficient documentation

## 2017-12-17 ENCOUNTER — Encounter (HOSPITAL_COMMUNITY): Payer: BC Managed Care – PPO

## 2017-12-21 ENCOUNTER — Encounter (HOSPITAL_COMMUNITY): Payer: BC Managed Care – PPO

## 2017-12-22 ENCOUNTER — Encounter (HOSPITAL_COMMUNITY): Payer: BC Managed Care – PPO

## 2017-12-24 ENCOUNTER — Encounter (HOSPITAL_COMMUNITY): Payer: BC Managed Care – PPO

## 2017-12-28 ENCOUNTER — Encounter (HOSPITAL_COMMUNITY): Payer: BC Managed Care – PPO

## 2017-12-28 ENCOUNTER — Other Ambulatory Visit: Payer: Self-pay | Admitting: Internal Medicine

## 2017-12-29 ENCOUNTER — Encounter (HOSPITAL_COMMUNITY): Payer: BC Managed Care – PPO

## 2017-12-31 ENCOUNTER — Encounter (HOSPITAL_COMMUNITY): Payer: BC Managed Care – PPO

## 2018-01-04 ENCOUNTER — Encounter (HOSPITAL_COMMUNITY): Payer: BC Managed Care – PPO

## 2018-01-05 ENCOUNTER — Encounter (HOSPITAL_COMMUNITY): Payer: BC Managed Care – PPO

## 2018-01-07 ENCOUNTER — Encounter (HOSPITAL_COMMUNITY): Payer: BC Managed Care – PPO

## 2018-01-11 ENCOUNTER — Encounter: Payer: Self-pay | Admitting: Internal Medicine

## 2018-01-11 ENCOUNTER — Encounter (HOSPITAL_COMMUNITY): Payer: BC Managed Care – PPO

## 2018-01-11 ENCOUNTER — Ambulatory Visit: Payer: BC Managed Care – PPO | Admitting: Internal Medicine

## 2018-01-11 VITALS — BP 112/60 | HR 75 | Ht 61.5 in | Wt 168.8 lb

## 2018-01-11 DIAGNOSIS — R002 Palpitations: Secondary | ICD-10-CM

## 2018-01-11 NOTE — Patient Instructions (Addendum)
Your physician recommends that you continue on your current medications as directed. Please refer to the Current Medication list given to you today.  Your physician wants you to follow-up in: 1 year with Dr. Taylor.  You will receive a reminder letter in the mail two months in advance. If you don't receive a letter, please call our office to schedule the follow-up appointment.  

## 2018-01-11 NOTE — Progress Notes (Signed)
HPI: Ms. Uber returns today for ongoing evaluation and management of palpitations. She has a h/o autonomic dysfunction and has recently been participating in cardiac rehab. She has some diastolic heart failure and feels well. Her Bp has been well controlled on Verapamil and ramipril. She has had some hot flashes.  Allergies  Allergen Reactions  . Pumpkin Flavor Anaphylaxis    Squash also  . Salmon [Fish Allergy] Anaphylaxis  . Crestor [Rosuvastatin Calcium] Other (See Comments)    Muscle pain  . Lipitor [Atorvastatin] Other (See Comments)    Muscle pain  . Sulfadoxine   . Sulfonamide Derivatives Other (See Comments)    REACTION: GI distress  . Morphine And Related Rash     Current Outpatient Medications  Medication Sig Dispense Refill  . Ascorbic Acid (VITAMIN C) 500 MG tablet Take 500 mg by mouth daily.      Marland Kitchen aspirin 81 MG tablet Take 81 mg by mouth at bedtime.     . Calcium Carbonate (CALCIUM 500 PO) Take 1,000 mg by mouth daily.     . cetirizine (ZYRTEC) 10 MG tablet Take 10 mg by mouth at bedtime.     . cholecalciferol (VITAMIN D) 1000 units tablet Take 1,000 Units by mouth daily.    . Coenzyme Q10 (CO Q 10) 100 MG CAPS Take 100 mg by mouth daily.     . Cyanocobalamin (VITAMIN B 12) 250 MCG LOZG Take 1 tablet by mouth daily.     Marland Kitchen gabapentin (NEURONTIN) 100 MG capsule Take 1 capsule by mouth daily.    . Insulin Infusion Pump KIT as directed.     Marland Kitchen MAGNESIUM PO Take 1 tablet by mouth daily.     . Multiple Vitamin (MULTIVITAMIN) tablet Take 1 tablet by mouth daily.      . nitroGLYCERIN (NITROSTAT) 0.4 MG SL tablet Place 1 tablet (0.4 mg total) under the tongue every 5 (five) minutes as needed for chest pain. 25 tablet 3  . NOVOLOG 100 UNIT/ML injection as directed.    . pantoprazole (PROTONIX) 40 MG tablet Take 40 mg by mouth See admin instructions. Takes 40 mg every morning, can take a second 40 mg if needed for acid    . Pitavastatin Calcium (LIVALO) 2 MG TABS Take 2  mg by mouth at bedtime.     . ramipril (ALTACE) 2.5 MG capsule Take 2.5 mg by mouth at bedtime.    . sertraline (ZOLOFT) 100 MG tablet Take 100 mg by mouth daily before breakfast.    . verapamil (CALAN) 40 MG tablet TAKE 1 TABLET (40 MG TOTAL) BY MOUTH ONCE AS NEEDED FOR UP TO 1 DOSE. 60 tablet 1  . verapamil (CALAN-SR) 120 MG CR tablet TAKE 1 TABLET (120 MG TOTAL) BY MOUTH AT BEDTIME. 90 tablet 1  . ZETIA 10 MG tablet TAKE 1 TAB (10 mg) BY MOUTH ONCE DAILY AT BEDTIME  3   No current facility-administered medications for this visit.      Past Medical History:  Diagnosis Date  . Anxiety   . Carpal tunnel syndrome, bilateral 06/02/2016  . Chest pain    RECURRENT WITH NEGITIVE CATHERIZATIONS  . Complication of anesthesia    aspirated and "gained 15pounds, ended up in ICU"  . DM (diabetes mellitus) ([place])   . Dyslipidemia   . Fatty liver   . GERD (gastroesophageal reflux disease)   . History of CT scan    Coronary CTA 3/17: Calcium score 0; no plaque or  stenosis noted in coronary arteries  . Hypercholesteremia   . Hypertension   . Migraine   . Obesity   . Pulmonary embolus ([place]) 2002  . Tachycardia    A fib    ROS:   All systems reviewed and negative except as noted in the HPI.   Past Surgical History:  Procedure Laterality Date  . CARDIAC CATHETERIZATION     x2  . CARPAL TUNNEL RELEASE     right  . CHOLECYSTECTOMY  03/24/2012   Procedure: LAPAROSCOPIC CHOLECYSTECTOMY WITH INTRAOPERATIVE CHOLANGIOGRAM;  Surgeon: Edward Jolly, MD;  Location: [facility];  Service: General;  Laterality: N/A;  . ESOPHAGEAL MANOMETRY N/A 05/28/2015   Procedure: ESOPHAGEAL MANOMETRY (EM);  Surgeon: Irene Shipper, MD;  Location: [facility];  Service: Gastroenterology;  Laterality: N/A;  . FIBROID TUMOR     UTERUS SURGERY  . LAPAROSCOPIC HYSTERECTOMY       Family History  Problem Relation Age of Onset  . Irritable bowel syndrome Father   . Gallbladder disease Mother   . Gallbladder  disease Unknown        3 mat. uncles  . Cancer Maternal Grandmother        ovarian  . Colon cancer Neg Hx      Social History   Socioeconomic History  . Marital status: Single    Spouse name: Not on file  . Number of children: 0  . Years of education: Post-grad  . Highest education level: Not on file  Occupational History  . Occupation: registered dietician    Comment: [employer]  Social Needs  . Financial resource strain: Not on file  . Food insecurity:    Worry: Not on file    Inability: Not on file  . Transportation needs:    Medical: Not on file    Non-medical: Not on file  Tobacco Use  . Smoking status: Never Smoker  . Smokeless tobacco: Never Used  Substance and Sexual Activity  . Alcohol use: Yes    Comment: rarely  . Drug use: No  . Sexual activity: Not on file  Lifestyle  . Physical activity:    Days per week: Not on file    Minutes per session: Not on file  . Stress: Not on file  Relationships  . Social connections:    Talks on phone: Not on file    Gets together: Not on file    Attends religious service: Not on file    Active member of club or organization: Not on file    Attends meetings of clubs or organizations: Not on file    Relationship status: Not on file  . Intimate partner violence:    Fear of current or ex partner: Not on file    Emotionally abused: Not on file    Physically abused: Not on file    Forced sexual activity: Not on file  Other Topics Concern  . Not on file  Social History Narrative   Lives at home alone   Right-handed   Caffeine: chocolate     BP 112/60   Pulse 75   Ht 5' 1.5" (1.562 m)   Wt 168 lb 12.8 oz (76.6 kg)   BMI 31.38 kg/m   Physical Exam:  Well appearing NAD HEENT: Unremarkable Neck:  No JVD, no thyromegally Lymphatics:  No adenopathy Back:  No CVA tenderness Lungs:  Clear with no wheezes HEART:  Regular rate rhythm, no murmurs, no rubs, no clicks Abd:  soft,  positive bowel sounds, no  organomegally, no rebound, no guarding Ext:  2 plus pulses, no edema, no cyanosis, no clubbing Skin:  No rashes no nodules Neuro:  CN II through XII intact, motor grossly intact  EKG - nsr   Assess/Plan: 1. Autonomic dysfunction - her palpitations are mostly well controlled. No syncope. 2. HTN - her blood pressure is well controlled.  3. Chest pain - she does not have any CAD by CT scan. 4. Obesity - her weight is down 6 lbs. She is encouraged to continue her cardiac rehab.  Mikle [email or web address].D.

## 2018-01-12 ENCOUNTER — Encounter (HOSPITAL_COMMUNITY): Payer: BC Managed Care – PPO

## 2018-01-12 DIAGNOSIS — I208 Other forms of angina pectoris: Secondary | ICD-10-CM | POA: Insufficient documentation

## 2018-01-14 ENCOUNTER — Encounter (HOSPITAL_COMMUNITY): Payer: BC Managed Care – PPO

## 2018-01-18 ENCOUNTER — Encounter (HOSPITAL_COMMUNITY): Payer: BC Managed Care – PPO

## 2018-01-19 ENCOUNTER — Encounter (HOSPITAL_COMMUNITY): Payer: BC Managed Care – PPO

## 2018-01-21 ENCOUNTER — Encounter (HOSPITAL_COMMUNITY): Payer: BC Managed Care – PPO

## 2018-01-25 ENCOUNTER — Encounter (HOSPITAL_COMMUNITY): Payer: BC Managed Care – PPO

## 2018-01-26 ENCOUNTER — Encounter (HOSPITAL_COMMUNITY): Payer: BC Managed Care – PPO

## 2018-01-28 ENCOUNTER — Encounter (HOSPITAL_COMMUNITY): Payer: BC Managed Care – PPO

## 2018-02-01 ENCOUNTER — Encounter (HOSPITAL_COMMUNITY): Payer: BC Managed Care – PPO

## 2018-02-02 ENCOUNTER — Encounter (HOSPITAL_COMMUNITY): Payer: BC Managed Care – PPO

## 2018-02-04 ENCOUNTER — Encounter (HOSPITAL_COMMUNITY): Payer: BC Managed Care – PPO

## 2018-02-08 ENCOUNTER — Encounter (HOSPITAL_COMMUNITY): Payer: BC Managed Care – PPO

## 2018-02-09 ENCOUNTER — Encounter (HOSPITAL_COMMUNITY): Payer: BC Managed Care – PPO

## 2018-02-10 ENCOUNTER — Encounter (HOSPITAL_COMMUNITY)
Admission: RE | Admit: 2018-02-10 | Discharge: 2018-02-10 | Disposition: A | Discharge status: Home / Self Care | Payer: BC Managed Care – PPO | Source: Ambulatory Visit | Attending: Internal Medicine | Admitting: Internal Medicine

## 2018-02-11 ENCOUNTER — Encounter (HOSPITAL_COMMUNITY): Payer: BC Managed Care – PPO

## 2018-02-16 ENCOUNTER — Encounter (HOSPITAL_COMMUNITY): Payer: BC Managed Care – PPO | Attending: Internal Medicine

## 2018-02-16 DIAGNOSIS — Z539 Procedure and treatment not carried out, unspecified reason: Secondary | ICD-10-CM | POA: Diagnosis not present

## 2018-02-16 DIAGNOSIS — I208 Other forms of angina pectoris: Secondary | ICD-10-CM | POA: Diagnosis not present

## 2018-02-18 ENCOUNTER — Encounter (HOSPITAL_COMMUNITY): Payer: BC Managed Care – PPO

## 2018-02-22 ENCOUNTER — Encounter (HOSPITAL_COMMUNITY): Payer: BC Managed Care – PPO

## 2018-02-23 ENCOUNTER — Encounter (HOSPITAL_COMMUNITY): Payer: BC Managed Care – PPO

## 2018-02-25 ENCOUNTER — Encounter (HOSPITAL_COMMUNITY): Payer: BC Managed Care – PPO

## 2018-03-01 ENCOUNTER — Encounter (HOSPITAL_COMMUNITY): Payer: BC Managed Care – PPO

## 2018-03-02 ENCOUNTER — Encounter (HOSPITAL_COMMUNITY): Payer: BC Managed Care – PPO

## 2018-03-04 ENCOUNTER — Encounter (HOSPITAL_COMMUNITY): Payer: BC Managed Care – PPO

## 2018-03-08 ENCOUNTER — Encounter (HOSPITAL_COMMUNITY)
Admission: RE | Admit: 2018-03-08 | Discharge: 2018-03-08 | Disposition: A | Discharge status: Home / Self Care | Payer: BC Managed Care – PPO | Source: Ambulatory Visit | Attending: Internal Medicine | Admitting: Internal Medicine

## 2018-03-09 ENCOUNTER — Encounter (HOSPITAL_COMMUNITY): Payer: BC Managed Care – PPO

## 2018-03-15 ENCOUNTER — Encounter (HOSPITAL_COMMUNITY): Payer: BC Managed Care – PPO | Attending: Internal Medicine

## 2018-03-15 DIAGNOSIS — Z539 Procedure and treatment not carried out, unspecified reason: Secondary | ICD-10-CM | POA: Insufficient documentation

## 2018-03-15 DIAGNOSIS — I208 Other forms of angina pectoris: Secondary | ICD-10-CM | POA: Diagnosis present

## 2018-03-16 ENCOUNTER — Encounter (HOSPITAL_COMMUNITY): Payer: BC Managed Care – PPO

## 2018-03-18 ENCOUNTER — Encounter (HOSPITAL_COMMUNITY): Payer: BC Managed Care – PPO

## 2018-03-22 ENCOUNTER — Encounter (HOSPITAL_COMMUNITY): Payer: BC Managed Care – PPO

## 2018-03-23 ENCOUNTER — Encounter (HOSPITAL_COMMUNITY): Payer: BC Managed Care – PPO

## 2018-03-25 ENCOUNTER — Encounter (HOSPITAL_COMMUNITY): Payer: BC Managed Care – PPO

## 2018-03-29 ENCOUNTER — Encounter (HOSPITAL_COMMUNITY): Payer: BC Managed Care – PPO

## 2018-03-30 ENCOUNTER — Encounter (HOSPITAL_COMMUNITY): Payer: BC Managed Care – PPO

## 2018-04-01 ENCOUNTER — Encounter (HOSPITAL_COMMUNITY): Payer: BC Managed Care – PPO

## 2018-04-05 ENCOUNTER — Encounter (HOSPITAL_COMMUNITY): Payer: BC Managed Care – PPO

## 2018-04-06 ENCOUNTER — Encounter: Payer: Self-pay | Admitting: Physician Assistant

## 2018-04-06 ENCOUNTER — Encounter (HOSPITAL_COMMUNITY): Payer: BC Managed Care – PPO

## 2018-04-06 ENCOUNTER — Ambulatory Visit: Payer: BC Managed Care – PPO | Admitting: Physician Assistant

## 2018-04-06 VITALS — BP 106/64 | HR 98 | Ht 62.0 in | Wt 160.0 lb

## 2018-04-06 DIAGNOSIS — R1011 Right upper quadrant pain: Secondary | ICD-10-CM | POA: Diagnosis not present

## 2018-04-06 DIAGNOSIS — K219 Gastro-esophageal reflux disease without esophagitis: Secondary | ICD-10-CM | POA: Diagnosis not present

## 2018-04-06 MED ORDER — FAMOTIDINE 40 MG PO TABS: 40.0000 mg | ORAL_TABLET | Freq: Two times a day (BID) | ORAL | 2 refills | Status: DC

## 2018-04-06 MED ORDER — SUCRALFATE 1 G PO TABS: 1.0000 g | ORAL_TABLET | Freq: Three times a day (TID) | ORAL | 1 refills | Status: DC

## 2018-04-06 NOTE — Progress Notes (Signed)
Assessment and plan reviewed 

## 2018-04-06 NOTE — Progress Notes (Signed)
Chief Complaint: Right upper quadrant pain, eructations, GERD  HPI:    Jocelyn [name] is a 52 year old female with a past medical history as listed below, known to Dr. Henrene Pastor, who presents to clinic today with a complaint of lots of belching, right upper quadrant pain and GERD.    05/07/2015 office visit with Dr. Henrene Pastor to discuss chronic atypical chest pain.  Patient had multiple negative work-ups.  Also underwent colonoscopy and upper endoscopy 11/06/2014.  Both were normal.  Discussed her atypical chest pain was unlikely GI, there was a question of esophageal spasm.  Her reflux was well controlled with a PPI.  She was scheduled for esophageal manometry to evaluate.    05/28/2015 esophageal manometry with normal esophageal peristalsis and normal LES relaxation, impedance testing indicated complete esophageal bolus clearance.    05/29/2017 CT abdomen pelvis without contrast no evidence of obstructive uropathy, nephrolithiasis or other acute findings in the abdomen or pelvis.  Small hiatal hernia.    Today, the patient presents clinic and explains that 03/31/2018 she had a shrimp and grit dinner that was full garlic and that night woke up with severe reflux and a right upper quadrant abdominal pain which radiated through to her back.  This has continued since then.  Patient describes that at its worst this is a 9/10 pain, worse with eating.  At its best is a 2-3/10.  Patient added Zantac twice daily to her Pantoprazole 40 mg twice daily which did help a bit over the past week, but then stopped her Zantac about 3 days ago because this was causing slow absorption of her other medications.  Patient tells me she already has slow emptying from diabetes.  Does admit to chewing a lot of ginger over the past 2 days which seems to help initially.    Social history positive for being a Microbiologist.    Denies fever, chills, weight loss, anorexia, nausea, vomiting, change in bowel habits or symptoms that awaken her from  sleep.  Past Medical History:  Diagnosis Date  . Anxiety   . Carpal tunnel syndrome, bilateral 06/02/2016  . Chest pain    RECURRENT WITH NEGITIVE CATHERIZATIONS  . Complication of anesthesia    aspirated and "gained 15pounds, ended up in ICU"  . DM (diabetes mellitus) ([place])   . Dyslipidemia   . Fatty liver   . GERD (gastroesophageal reflux disease)   . History of CT scan    Coronary CTA 3/17: Calcium score 0; no plaque or stenosis noted in coronary arteries  . Hypercholesteremia   . Hypertension   . Migraine   . Obesity   . Pulmonary embolus ([place]) 2002  . Tachycardia    A fib    Past Surgical History:  Procedure Laterality Date  . CARDIAC CATHETERIZATION     x2  . CARPAL TUNNEL RELEASE     right  . CHOLECYSTECTOMY  03/24/2012   Procedure: LAPAROSCOPIC CHOLECYSTECTOMY WITH INTRAOPERATIVE CHOLANGIOGRAM;  Surgeon: Edward Jolly, MD;  Location: [facility];  Service: General;  Laterality: N/A;  . ESOPHAGEAL MANOMETRY N/A 05/28/2015   Procedure: ESOPHAGEAL MANOMETRY (EM);  Surgeon: Irene Shipper, MD;  Location: [facility];  Service: Gastroenterology;  Laterality: N/A;  . FIBROID TUMOR     UTERUS SURGERY  . LAPAROSCOPIC HYSTERECTOMY      Current Outpatient Medications  Medication Sig Dispense Refill  . Ascorbic Acid (VITAMIN C) 500 MG tablet Take 500 mg by mouth daily.      Marland Kitchen aspirin 81 MG  tablet Take 81 mg by mouth at bedtime.     . Calcium Carbonate (CALCIUM 500 PO) Take 1,000 mg by mouth daily.     . cetirizine (ZYRTEC) 10 MG tablet Take 10 mg by mouth at bedtime.     . cholecalciferol (VITAMIN D) 1000 units tablet Take 1,000 Units by mouth daily.    . Coenzyme Q10 (CO Q 10) 100 MG CAPS Take 100 mg by mouth daily.     . Cyanocobalamin (VITAMIN B 12) 250 MCG LOZG Take 1 tablet by mouth daily.     Marland Kitchen gabapentin (NEURONTIN) 100 MG capsule Take 1 capsule by mouth daily.    . Insulin Infusion Pump KIT as directed.     Marland Kitchen MAGNESIUM PO Take 1 tablet by mouth daily.     .  Multiple Vitamin (MULTIVITAMIN) tablet Take 1 tablet by mouth daily.      . nitroGLYCERIN (NITROSTAT) 0.4 MG SL tablet Place 1 tablet (0.4 mg total) under the tongue every 5 (five) minutes as needed for chest pain. 25 tablet 3  . NOVOLOG 100 UNIT/ML injection as directed.    . pantoprazole (PROTONIX) 40 MG tablet Take 40 mg by mouth See admin instructions. Takes 40 mg every morning, can take a second 40 mg if needed for acid    . Pitavastatin Calcium (LIVALO) 2 MG TABS Take 2 mg by mouth at bedtime.     . ramipril (ALTACE) 2.5 MG capsule Take 2.5 mg by mouth at bedtime.    . sertraline (ZOLOFT) 100 MG tablet Take 100 mg by mouth daily before breakfast.    . verapamil (CALAN) 40 MG tablet TAKE 1 TABLET (40 MG TOTAL) BY MOUTH ONCE AS NEEDED FOR UP TO 1 DOSE. 60 tablet 1  . verapamil (CALAN-SR) 120 MG CR tablet TAKE 1 TABLET (120 MG TOTAL) BY MOUTH AT BEDTIME. 90 tablet 1  . ZETIA 10 MG tablet TAKE 1 TAB (10 mg) BY MOUTH ONCE DAILY AT BEDTIME  3   No current facility-administered medications for this visit.     Allergies as of 04/06/2018 - Review Complete 04/06/2018  Allergen Reaction Noted  . Pumpkin flavor Anaphylaxis 04/04/2015  . Salmon [fish allergy] Anaphylaxis 04/04/2015  . Crestor [rosuvastatin calcium] Other (See Comments) 12/21/2015  . Lipitor [atorvastatin] Other (See Comments) 12/21/2015  . Sulfadoxine    . Sulfonamide derivatives Other (See Comments)   . Morphine and related Rash 04/04/2015    Family History  Problem Relation Age of Onset  . Irritable bowel syndrome Father   . Gallbladder disease Mother   . Gallbladder disease Other        3 mat. uncles  . Cancer Maternal Grandmother        ovarian  . Colon cancer Neg Hx     Social History   Socioeconomic History  . Marital status: Single    Spouse name: Not on file  . Number of children: 0  . Years of education: Post-grad  . Highest education level: Not on file  Occupational History  . Occupation: registered  dietician    Comment: [employer]  Social Needs  . Financial resource strain: Not on file  . Food insecurity:    Worry: Not on file    Inability: Not on file  . Transportation needs:    Medical: Not on file    Non-medical: Not on file  Tobacco Use  . Smoking status: Never Smoker  . Smokeless tobacco: Never Used  Substance and Sexual  Activity  . Alcohol use: Yes    Comment: rarely  . Drug use: No  . Sexual activity: Not on file  Lifestyle  . Physical activity:    Days per week: Not on file    Minutes per session: Not on file  . Stress: Not on file  Relationships  . Social connections:    Talks on phone: Not on file    Gets together: Not on file    Attends religious service: Not on file    Active member of club or organization: Not on file    Attends meetings of clubs or organizations: Not on file    Relationship status: Not on file  . Intimate partner violence:    Fear of current or ex partner: Not on file    Emotionally abused: Not on file    Physically abused: Not on file    Forced sexual activity: Not on file  Other Topics Concern  . Not on file  Social History Narrative   Lives at home alone   Right-handed   Caffeine: chocolate    Review of Systems:    Constitutional: No weight loss, fever or chills Skin: No rash  Cardiovascular: No chest pain Respiratory: No SOB Gastrointestinal: See HPI and otherwise negative Genitourinary: No dysuria  Neurological: No headache, dizziness or syncope Musculoskeletal: No new muscle or joint pain Hematologic: No bleeding  Psychiatric: No history of depression or anxiety   Physical Exam:  Vital signs: BP 106/64   Pulse 98   Ht '5\' 2"'$  (1.575 m)   Wt 160 lb (72.6 kg)   BMI 29.26 kg/m    Constitutional:   Pleasant Caucasian female appears to be in NAD, Well developed, Well nourished, alert and cooperative Respiratory: Respirations even and unlabored. Lungs clear to auscultation bilaterally.   No wheezes,  crackles, or rhonchi.  Cardiovascular: Normal S1, S2. No MRG. Regular rate and rhythm. No peripheral edema, cyanosis or pallor.  Gastrointestinal:  Soft, nondistended, mild RUQ/Epigastric ttp. No rebound or guarding. Normal bowel sounds. No appreciable masses or hepatomegaly. Psychiatric: Demonstrates good judgement and reason without abnormal affect or behaviors.  No recent labs or imaging.  Assessment: 1.  GERD: Uncontrolled over the past week and a half after eating a extremely garlic eat dinner, some better when patient added Zantac twice daily which she thought this is slowing absorption; likely gastritis 2.  Right upper quadrant/epigastric pain: With above  Plan: 1.  Continue Pantoprazole 40 mg twice daily, 30-60 minutes before breakfast and dinner #60 with 3 refills 2.  Prescribed Pepcid 40 mg twice daily, every morning and nightly #60 with 3 refills 3.  Prescribed Carafate 1 g 2-3 times a day, an hour before or 2 hours after eating and other medications #90 with 1 refill 4.  Would recommend patient discontinue ginger. 5.  Patient to follow in clinic with me in 2-3 weeks or sooner if necessary.  If symptoms remain uncontrolled recommend repeat EGD.  Ellouise Newer, PA-C [facility] 04/06/2018, 9:48 AM  Cc: Haywood Pao, MD

## 2018-04-06 NOTE — Patient Instructions (Addendum)
If you are age 53 or older, your body mass index should be between 23-30. Your Body mass index is 29.26 kg/m. If this is out of the aforementioned range listed, please consider follow up with your Primary Care Provider.  If you are age 55 or younger, your body mass index should be between 19-25. Your Body mass index is 29.26 kg/m. If this is out of the aformentioned range listed, please consider follow up with your Primary Care Provider.   We have sent the following medications to your pharmacy for you to pick up at your convenience: Carafate Pepcid  Continue Pantoprazole 40 mg twice daily.  STOP Ginger.  Follow up with me on April 21, 2018 at 10:45 am.  Thank you for choosing me and [facility].    Ellouise Newer, PA-C

## 2018-04-08 ENCOUNTER — Encounter (HOSPITAL_COMMUNITY): Payer: BC Managed Care – PPO

## 2018-04-12 ENCOUNTER — Encounter (HOSPITAL_COMMUNITY): Payer: BC Managed Care – PPO

## 2018-04-13 ENCOUNTER — Encounter (HOSPITAL_COMMUNITY): Payer: BC Managed Care – PPO

## 2018-04-15 ENCOUNTER — Encounter (HOSPITAL_COMMUNITY): Payer: BC Managed Care – PPO | Attending: Internal Medicine

## 2018-04-15 DIAGNOSIS — Z539 Procedure and treatment not carried out, unspecified reason: Secondary | ICD-10-CM | POA: Diagnosis not present

## 2018-04-15 DIAGNOSIS — I208 Other forms of angina pectoris: Secondary | ICD-10-CM | POA: Insufficient documentation

## 2018-04-19 ENCOUNTER — Encounter (HOSPITAL_COMMUNITY): Payer: BC Managed Care – PPO

## 2018-04-20 ENCOUNTER — Encounter (HOSPITAL_COMMUNITY): Payer: BC Managed Care – PPO

## 2018-04-21 ENCOUNTER — Ambulatory Visit: Payer: BC Managed Care – PPO | Admitting: Physician Assistant

## 2018-04-21 ENCOUNTER — Encounter: Payer: Self-pay | Admitting: Physician Assistant

## 2018-04-21 VITALS — BP 114/60 | HR 92 | Ht 62.0 in | Wt 168.4 lb

## 2018-04-21 DIAGNOSIS — R1013 Epigastric pain: Secondary | ICD-10-CM

## 2018-04-21 DIAGNOSIS — K219 Gastro-esophageal reflux disease without esophagitis: Secondary | ICD-10-CM | POA: Diagnosis not present

## 2018-04-21 NOTE — Progress Notes (Signed)
Assessment and plan reviewed 

## 2018-04-21 NOTE — Progress Notes (Signed)
Chief Complaint: Epigastric abdominal pain, GERD  HPI:    Jocelyn [name] is a 53 year old female with a past medical history as listed below, known to Dr. Henrene Pastor, who returns clinic today for follow-up of her epigastric abdominal pain and reflux.      04/06/2018 office visit with me discussed severe reflux and right upper quadrant abdominal pain which radiated through to her back rated as a 9/10, some better with adding Zantac twice daily to her pantoprazole twice daily.  At that time continued Pantoprazole twice daily, added Pepcid 40 mg twice daily, discontinued Zantac, prescribed Carafate 1 g 2-3 times a day and recommended she follow in clinic in 2 to 3 weeks.    Today, patient tells me that adding the Pepcid did help some, in fact she was doing fairly okay until this past weekend when she was woken up again with severe reflux pain which radiated into her esophagus and taste of acid in her mouth.  Denies late night eating or food trigger.    Denies nausea, vomiting or blood in her stool.  Past Medical History:  Diagnosis Date  . Anxiety   . Carpal tunnel syndrome, bilateral 06/02/2016  . Chest pain    RECURRENT WITH NEGITIVE CATHERIZATIONS  . Complication of anesthesia    aspirated and "gained 15pounds, ended up in ICU"  . DM (diabetes mellitus) ([place])   . Dyslipidemia   . Fatty liver   . GERD (gastroesophageal reflux disease)   . History of CT scan    Coronary CTA 3/17: Calcium score 0; no plaque or stenosis noted in coronary arteries  . Hypercholesteremia   . Hypertension   . Migraine   . Obesity   . Pulmonary embolus ([place]) 2002  . Tachycardia    A fib    Past Surgical History:  Procedure Laterality Date  . CARDIAC CATHETERIZATION     x2  . CARPAL TUNNEL RELEASE     right  . CHOLECYSTECTOMY  03/24/2012   Procedure: LAPAROSCOPIC CHOLECYSTECTOMY WITH INTRAOPERATIVE CHOLANGIOGRAM;  Surgeon: Edward Jolly, MD;  Location: [facility];  Service: General;  Laterality: N/A;  .  ESOPHAGEAL MANOMETRY N/A 05/28/2015   Procedure: ESOPHAGEAL MANOMETRY (EM);  Surgeon: Irene Shipper, MD;  Location: [facility];  Service: Gastroenterology;  Laterality: N/A;  . FIBROID TUMOR     UTERUS SURGERY  . LAPAROSCOPIC HYSTERECTOMY      Current Outpatient Medications  Medication Sig Dispense Refill  . Ascorbic Acid (VITAMIN C) 500 MG tablet Take 500 mg by mouth daily.      Marland Kitchen aspirin 81 MG tablet Take 81 mg by mouth at bedtime.     . Calcium Carbonate (CALCIUM 500 PO) Take 1,000 mg by mouth daily.     . cetirizine (ZYRTEC) 10 MG tablet Take 10 mg by mouth at bedtime.     . cholecalciferol (VITAMIN D) 1000 units tablet Take 1,000 Units by mouth daily.    . Coenzyme Q10 (CO Q 10) 100 MG CAPS Take 100 mg by mouth daily.     . Cyanocobalamin (VITAMIN B 12) 250 MCG LOZG Take 1 tablet by mouth daily.     . famotidine (PEPCID) 40 MG tablet Take 1 tablet (40 mg total) by mouth 2 (two) times daily. Take at 9 am and at bedtime. 60 tablet 2  . gabapentin (NEURONTIN) 100 MG capsule Take 1 capsule by mouth daily.    . Insulin Infusion Pump KIT as directed.     Marland Kitchen MAGNESIUM PO  Take 1 tablet by mouth daily.     . Multiple Vitamin (MULTIVITAMIN) tablet Take 1 tablet by mouth daily.      . nitroGLYCERIN (NITROSTAT) 0.4 MG SL tablet Place 1 tablet (0.4 mg total) under the tongue every 5 (five) minutes as needed for chest pain. 25 tablet 3  . NOVOLOG 100 UNIT/ML injection as directed.    . pantoprazole (PROTONIX) 40 MG tablet Take 40 mg by mouth See admin instructions. Takes 40 mg every morning, can take a second 40 mg if needed for acid    . Pitavastatin Calcium (LIVALO) 2 MG TABS Take 2 mg by mouth at bedtime.     . ramipril (ALTACE) 2.5 MG capsule Take 2.5 mg by mouth at bedtime.    . sertraline (ZOLOFT) 100 MG tablet Take 100 mg by mouth daily before breakfast.    . sucralfate (CARAFATE) 1 g tablet Take 1 tablet (1 g total) by mouth 3 (three) times daily. Take one hour before or 2 hours after other  meds and food 90 tablet 1  . verapamil (CALAN) 40 MG tablet TAKE 1 TABLET (40 MG TOTAL) BY MOUTH ONCE AS NEEDED FOR UP TO 1 DOSE. 60 tablet 1  . verapamil (CALAN-SR) 120 MG CR tablet TAKE 1 TABLET (120 MG TOTAL) BY MOUTH AT BEDTIME. 90 tablet 1  . ZETIA 10 MG tablet TAKE 1 TAB (10 mg) BY MOUTH ONCE DAILY AT BEDTIME  3   No current facility-administered medications for this visit.     Allergies as of 04/21/2018 - Review Complete 04/21/2018  Allergen Reaction Noted  . Pumpkin flavor Anaphylaxis 04/04/2015  . Salmon [fish allergy] Anaphylaxis 04/04/2015  . Crestor [rosuvastatin calcium] Other (See Comments) 12/21/2015  . Lipitor [atorvastatin] Other (See Comments) 12/21/2015  . Sulfadoxine    . Sulfonamide derivatives Other (See Comments)   . Morphine and related Rash 04/04/2015    Family History  Problem Relation Age of Onset  . Irritable bowel syndrome Father   . Gallbladder disease Mother   . Gallbladder disease Other        3 mat. uncles  . Cancer Maternal Grandmother        ovarian  . Colon cancer Neg Hx     Social History   Socioeconomic History  . Marital status: Single    Spouse name: Not on file  . Number of children: 0  . Years of education: Post-grad  . Highest education level: Not on file  Occupational History  . Occupation: registered dietician    Comment: [employer]  Social Needs  . Financial resource strain: Not on file  . Food insecurity:    Worry: Not on file    Inability: Not on file  . Transportation needs:    Medical: Not on file    Non-medical: Not on file  Tobacco Use  . Smoking status: Never Smoker  . Smokeless tobacco: Never Used  Substance and Sexual Activity  . Alcohol use: Yes    Comment: rarely  . Drug use: No  . Sexual activity: Not on file  Lifestyle  . Physical activity:    Days per week: Not on file    Minutes per session: Not on file  . Stress: Not on file  Relationships  . Social connections:    Talks on phone:  Not on file    Gets together: Not on file    Attends religious service: Not on file    Active member of club or  organization: Not on file    Attends meetings of clubs or organizations: Not on file    Relationship status: Not on file  . Intimate partner violence:    Fear of current or ex partner: Not on file    Emotionally abused: Not on file    Physically abused: Not on file    Forced sexual activity: Not on file  Other Topics Concern  . Not on file  Social History Narrative   Lives at home alone   Right-handed   Caffeine: chocolate    Review of Systems:    Constitutional: No weight loss, fever or chills Cardiovascular: No chest pain Respiratory: No SOB  Gastrointestinal: See HPI and otherwise negative   Physical Exam:  Vital signs: BP 114/60   Pulse 92   Ht _0  (1.575 m)   Wt 168 lb 6.4 oz (76.4 kg)   BMI 30.80 kg/m   Constitutional:   Pleasant Caucasian female appears to be in NAD, Well developed, Well nourished, alert and cooperative Respiratory: Respirations even and unlabored. Lungs clear to auscultation bilaterally.   No wheezes, crackles, or rhonchi.  Cardiovascular: Normal S1, S2. No MRG. Regular rate and rhythm. No peripheral edema, cyanosis or pallor.  Gastrointestinal:  Soft, nondistended, mild epigastric ttp, No rebound or guarding. Normal bowel sounds. No appreciable masses or hepatomegaly. Psychiatric: Demonstrates good judgement and reason without abnormal affect or behaviors.  No new labs or imaging.  Assessment: 1.  Epigastric/right upper quadrant pain: Continues, there is a chronic element of this, but likely this is irritated by below 2.  GERD: Continues regardless of addition of Pepcid 40 mg twice daily to patient's Pantoprazole twice daily over the past month  Plan: 1.  Scheduled patient for an EGD in the [place] with Dr. Henrene Pastor.  Discussed risks, benefits, limitations and alternatives and the patient agrees to proceed. 2.  Continue Pepcid 40 mg twice  daily and Pantoprazole 40 mg twice daily 3.  Reviewed antireflux diet and lifestyle modifications 4.  Patient to follow in clinic per recommendations from Dr. Henrene Pastor after time of procedure.  Ellouise Newer, PA-C [facility] 04/21/2018, 11:46 AM  Cc: Haywood Pao, MD

## 2018-04-21 NOTE — Patient Instructions (Signed)
You have been scheduled for an endoscopy. Please follow written instructions given to you at your visit today. If you use inhalers (even only as needed), please bring them with you on the day of your procedure. Your physician has requested that you go to www.startemmi.com and enter the access code given to you at your visit today. This web site gives a general overview about your procedure. However, you should still follow specific instructions given to you by our office regarding your preparation for the procedure.  Contine pantoprazole and pepcid as prescribed.  Please follow up with Dr Henrene Pastor after endoscopy as per recommendations.  If you are age 70 or older, your body mass index should be between 23-30. Your Body mass index is 30.8 kg/m. If this is out of the aforementioned range listed, please consider follow up with your Primary Care Provider.  If you are age 80 or younger, your body mass index should be between 19-25. Your Body mass index is 30.8 kg/m. If this is out of the aformentioned range listed, please consider follow up with your Primary Care Provider.

## 2018-04-22 ENCOUNTER — Encounter (HOSPITAL_COMMUNITY): Payer: BC Managed Care – PPO

## 2018-04-26 ENCOUNTER — Encounter (HOSPITAL_COMMUNITY): Payer: BC Managed Care – PPO

## 2018-04-27 ENCOUNTER — Encounter (HOSPITAL_COMMUNITY): Payer: BC Managed Care – PPO

## 2018-04-28 ENCOUNTER — Telehealth: Payer: Self-pay

## 2018-04-28 ENCOUNTER — Other Ambulatory Visit: Payer: Self-pay | Admitting: Physician Assistant

## 2018-04-28 NOTE — Telephone Encounter (Signed)
Faxed insulin pump letter to Dr. Osborne Casco on 04/21/18.  No response yet, called and lm with [place] that I needed an answer regarding insulin pump instructions before her procedure asap.

## 2018-04-28 NOTE — Telephone Encounter (Signed)
Lm on vm regarding insulin pump

## 2018-04-29 ENCOUNTER — Encounter (HOSPITAL_COMMUNITY): Payer: BC Managed Care – PPO

## 2018-04-30 ENCOUNTER — Encounter: Payer: Self-pay | Admitting: Internal Medicine

## 2018-04-30 NOTE — Telephone Encounter (Signed)
Spoke to patient and gave her Dr. Loren Racer insulin pump directions.  Patient understood

## 2018-05-03 ENCOUNTER — Encounter (HOSPITAL_COMMUNITY)
Admission: RE | Admit: 2018-05-03 | Discharge: 2018-05-03 | Disposition: A | Discharge status: Home / Self Care | Payer: BC Managed Care – PPO | Source: Ambulatory Visit | Attending: Internal Medicine | Admitting: Internal Medicine

## 2018-05-03 ENCOUNTER — Other Ambulatory Visit: Payer: Self-pay | Admitting: Internal Medicine

## 2018-05-04 ENCOUNTER — Encounter (HOSPITAL_COMMUNITY): Payer: BC Managed Care – PPO

## 2018-05-06 ENCOUNTER — Encounter (HOSPITAL_COMMUNITY): Payer: BC Managed Care – PPO

## 2018-05-07 ENCOUNTER — Encounter: Payer: Self-pay | Admitting: Internal Medicine

## 2018-05-07 ENCOUNTER — Ambulatory Visit (AMBULATORY_SURGERY_CENTER): Payer: BC Managed Care – PPO | Admitting: Internal Medicine

## 2018-05-07 VITALS — BP 113/71 | HR 95 | Temp 97.5°F | Resp 15 | Ht 62.0 in | Wt 168.0 lb

## 2018-05-07 DIAGNOSIS — K219 Gastro-esophageal reflux disease without esophagitis: Secondary | ICD-10-CM | POA: Diagnosis not present

## 2018-05-07 DIAGNOSIS — R1013 Epigastric pain: Secondary | ICD-10-CM

## 2018-05-07 DIAGNOSIS — K317 Polyp of stomach and duodenum: Secondary | ICD-10-CM

## 2018-05-07 MED ORDER — SODIUM CHLORIDE 0.9 % IV SOLN: 500.0000 mL | Freq: Once | INTRAVENOUS | Status: DC

## 2018-05-07 NOTE — Progress Notes (Signed)
Called to room to assist during endoscopic procedure.  Patient ID and intended procedure confirmed with present staff. Received instructions for my participation in the procedure from the performing physician.  

## 2018-05-07 NOTE — Progress Notes (Signed)
Vss. Pt awake. Report given to rn. No anesthetic complications

## 2018-05-07 NOTE — Patient Instructions (Signed)
YOU HAD AN ENDOSCOPIC PROCEDURE TODAY AT [facility]:   Refer to the procedure report that was given to you for any specific questions about what was found during the examination.  If the procedure report does not answer your questions, please call your gastroenterologist to clarify.  If you requested that your care partner not be given the details of your procedure findings, then the procedure report has been included in a sealed envelope for you to review at your convenience later.  YOU SHOULD EXPECT: Some feelings of bloating in the abdomen. Passage of more gas than usual.  Walking can help get rid of the air that was put into your GI tract during the procedure and reduce the bloating.   Please Note:  You might notice some irritation and congestion in your nose or some drainage.  This is from the oxygen used during your procedure.  There is no need for concern and it should clear up in a day or so.  SYMPTOMS TO REPORT IMMEDIATELY:    Following upper endoscopy (EGD)  Vomiting of blood or coffee ground material  New chest pain or pain under the shoulder blades  Painful or persistently difficult swallowing  New shortness of breath  Fever of 100F or higher  Black, tarry-looking stools  For urgent or emergent issues, a gastroenterologist can be reached at any hour by calling [phone].   DIET:  We do recommend a small meal at first, but then you may proceed to your regular diet.  Drink plenty of fluids but you should avoid alcoholic beverages for 24 hours.  ACTIVITY:  You should plan to take it easy for the rest of today and you should NOT DRIVE or use heavy machinery until tomorrow (because of the sedation medicines used during the test).    FOLLOW UP: Our staff will call the number listed on your records the next business day following your procedure to check on you and address any questions or concerns that you may have regarding the information given to you  following your procedure. If we do not reach you, we will leave a message.  However, if you are feeling well and you are not experiencing any problems, there is no need to return our call.  We will assume that you have returned to your regular daily activities without incident. Dr. Blanch Media nurse will call you regarding the gastric study.  Read all handouts given to you by your recovery room nurse.  If any biopsies were taken you will be contacted by phone or by letter within the next 1-3 weeks.  Please call us at [phone] if you have not heard about the biopsies in 3 weeks.    SIGNATURES/CONFIDENTIALITY: You and/or your care partner have signed paperwork which will be entered into your electronic medical record.  These signatures attest to the fact that that the information above on your After Visit Summary has been reviewed and is understood.  Full responsibility of the confidentiality of this discharge information lies with you and/or your care-partner.

## 2018-05-07 NOTE — Op Note (Signed)
[place] Patient Name: Jocelyn [name] Procedure Date: 05/07/2018 10:24 AM MRN: [id number] Endoscopist: Docia Chuck. Henrene Pastor , MD Age: 53 Referring MD:  Date of Birth: 18-Jul-1965 Gender: Female Account #: [id number] Procedure:                Upper GI endoscopy with biopsies Indications:              Epigastric abdominal pain, Esophageal reflux,                            Abdominal bloating, Chest pain (non cardiac) Medicines:                Monitored Anesthesia Care Procedure:                Pre-Anesthesia Assessment:                           - Prior to the procedure, a History and Physical                            was performed, and patient medications and                            allergies were reviewed. The patient's tolerance of                            previous anesthesia was also reviewed. The risks                            and benefits of the procedure and the sedation                            options and risks were discussed with the patient.                            All questions were answered, and informed consent                            was obtained. Prior Anticoagulants: The patient has                            taken no previous anticoagulant or antiplatelet                            agents. ASA Grade Assessment: III - A patient with                            severe systemic disease. After reviewing the risks                            and benefits, the patient was deemed in                            satisfactory condition to undergo the procedure.  After obtaining informed consent, the endoscope was                            passed under direct vision. Throughout the                            procedure, the patient's blood pressure, pulse, and                            oxygen saturations were monitored continuously. The                            Endoscope was introduced through the mouth, and   advanced to the second part of duodenum. The upper                            GI endoscopy was accomplished without difficulty.                            The patient tolerated the procedure well. Scope In: Scope Out: Findings:                 The esophagus was normal.                           The stomach revealed atrophic gastric mucosa and                            several diminutive polyps. Biopsies were taken with                            a cold forceps for histology.                           The examined duodenum was normal.                           The cardia and gastric fundus were normal on                            retroflexion. Complications:            No immediate complications. Estimated Blood Loss:     Estimated blood loss: none. Impression:               1. GERD                           2. Chronic recurrent chest pain without GI cause                            found                           3. Diminutive gastric polyp status post biopsy. Recommendation:           1. Reflux precautions  2. Weight loss. This will help with reflux symptoms                            and regurgitation component of symptoms                           3. Schedule solid-phase gastric emptying scan "rule                            out gastroparesis"                           4. Continue current medications. Lowest dose of                            acid suppressive medications to control symptoms                           5. Resume general medical care with Dr. Osborne Casco. Docia Chuck. Henrene Pastor, MD 05/07/2018 10:48:51 AM This report has been signed electronically.

## 2018-05-08 ENCOUNTER — Other Ambulatory Visit: Payer: Self-pay | Admitting: Physician Assistant

## 2018-05-10 ENCOUNTER — Encounter (HOSPITAL_COMMUNITY): Payer: BC Managed Care – PPO

## 2018-05-10 ENCOUNTER — Telehealth: Payer: Self-pay

## 2018-05-10 ENCOUNTER — Other Ambulatory Visit: Payer: Self-pay

## 2018-05-10 DIAGNOSIS — R109 Unspecified abdominal pain: Secondary | ICD-10-CM

## 2018-05-10 NOTE — Telephone Encounter (Signed)
Pt scheduled for GES at [facility] 05/19/18@7 :30am, pt to arrive there at 7:15am. Pt to be NPO after midnight and hold stomach meds. Pt aware.

## 2018-05-10 NOTE — Telephone Encounter (Signed)
Left message on f/u call 

## 2018-05-10 NOTE — Telephone Encounter (Signed)
Called [phone] and left a messaged we tried to reach pt for a follow up call. maw

## 2018-05-11 ENCOUNTER — Telehealth: Payer: Self-pay | Admitting: *Deleted

## 2018-05-11 ENCOUNTER — Encounter (HOSPITAL_COMMUNITY): Payer: BC Managed Care – PPO

## 2018-05-11 ENCOUNTER — Other Ambulatory Visit: Payer: Self-pay | Admitting: *Deleted

## 2018-05-11 MED ORDER — SUCRALFATE 1 G PO TABS: ORAL_TABLET | ORAL | 36 refills | Status: DC

## 2018-05-11 NOTE — Telephone Encounter (Signed)
:  Per Ellouise Newer PA, Ok to give the patient refills on the carafate. Patient was seen 04-06-2018, 04-16-2018. Had EGD on 05-07-2018.

## 2018-05-12 ENCOUNTER — Encounter: Payer: Self-pay | Admitting: Internal Medicine

## 2018-05-13 ENCOUNTER — Encounter (HOSPITAL_COMMUNITY): Payer: BC Managed Care – PPO

## 2018-05-17 ENCOUNTER — Encounter (HOSPITAL_COMMUNITY): Payer: BC Managed Care – PPO | Attending: Internal Medicine

## 2018-05-17 DIAGNOSIS — Z539 Procedure and treatment not carried out, unspecified reason: Secondary | ICD-10-CM | POA: Insufficient documentation

## 2018-05-17 DIAGNOSIS — I208 Other forms of angina pectoris: Secondary | ICD-10-CM | POA: Insufficient documentation

## 2018-05-18 ENCOUNTER — Encounter (HOSPITAL_COMMUNITY): Payer: BC Managed Care – PPO

## 2018-05-19 ENCOUNTER — Ambulatory Visit (HOSPITAL_COMMUNITY)
Admission: RE | Admit: 2018-05-19 | Discharge: 2018-05-19 | Disposition: A | Discharge status: Home / Self Care | Payer: BC Managed Care – PPO | Source: Ambulatory Visit | Attending: Internal Medicine | Admitting: Internal Medicine

## 2018-05-19 DIAGNOSIS — R109 Unspecified abdominal pain: Secondary | ICD-10-CM | POA: Insufficient documentation

## 2018-05-19 MED ORDER — TECHNETIUM TC 99M SULFUR COLLOID
2.1000 | Freq: Once | INTRAVENOUS | Status: AC | PRN
  Administered 2018-05-19: 2.1 via INTRAVENOUS

## 2018-05-20 ENCOUNTER — Encounter (HOSPITAL_COMMUNITY): Payer: BC Managed Care – PPO

## 2018-05-24 ENCOUNTER — Encounter (HOSPITAL_COMMUNITY): Payer: BC Managed Care – PPO

## 2018-05-25 ENCOUNTER — Encounter (HOSPITAL_COMMUNITY): Payer: BC Managed Care – PPO

## 2018-05-27 ENCOUNTER — Encounter (HOSPITAL_COMMUNITY): Payer: BC Managed Care – PPO

## 2018-05-31 ENCOUNTER — Encounter (HOSPITAL_COMMUNITY): Payer: BC Managed Care – PPO

## 2018-06-01 ENCOUNTER — Encounter (HOSPITAL_COMMUNITY): Payer: BC Managed Care – PPO

## 2018-06-03 ENCOUNTER — Encounter (HOSPITAL_COMMUNITY): Payer: BC Managed Care – PPO

## 2018-06-07 ENCOUNTER — Encounter (HOSPITAL_COMMUNITY): Payer: BC Managed Care – PPO

## 2018-06-08 ENCOUNTER — Encounter (HOSPITAL_COMMUNITY): Payer: BC Managed Care – PPO

## 2018-06-10 ENCOUNTER — Encounter (HOSPITAL_COMMUNITY): Payer: BC Managed Care – PPO

## 2018-06-14 ENCOUNTER — Encounter (HOSPITAL_COMMUNITY): Payer: BC Managed Care – PPO | Attending: Internal Medicine

## 2018-06-14 DIAGNOSIS — I208 Other forms of angina pectoris: Secondary | ICD-10-CM | POA: Insufficient documentation

## 2018-06-14 DIAGNOSIS — Z539 Procedure and treatment not carried out, unspecified reason: Secondary | ICD-10-CM | POA: Insufficient documentation

## 2018-06-15 ENCOUNTER — Encounter (HOSPITAL_COMMUNITY): Payer: BC Managed Care – PPO

## 2018-06-15 DIAGNOSIS — Z539 Procedure and treatment not carried out, unspecified reason: Secondary | ICD-10-CM | POA: Diagnosis not present

## 2018-06-15 DIAGNOSIS — I208 Other forms of angina pectoris: Secondary | ICD-10-CM | POA: Diagnosis present

## 2018-06-17 ENCOUNTER — Encounter (HOSPITAL_COMMUNITY): Payer: BC Managed Care – PPO

## 2018-06-17 ENCOUNTER — Other Ambulatory Visit: Payer: Self-pay | Admitting: Urology

## 2018-06-21 ENCOUNTER — Other Ambulatory Visit: Payer: Self-pay | Admitting: Urology

## 2018-06-21 ENCOUNTER — Encounter (HOSPITAL_COMMUNITY): Payer: BC Managed Care – PPO

## 2018-06-21 DIAGNOSIS — R1031 Right lower quadrant pain: Principal | ICD-10-CM

## 2018-06-21 DIAGNOSIS — R300 Dysuria: Secondary | ICD-10-CM

## 2018-06-21 DIAGNOSIS — G8929 Other chronic pain: Secondary | ICD-10-CM

## 2018-06-21 DIAGNOSIS — R1032 Left lower quadrant pain: Principal | ICD-10-CM

## 2018-06-22 ENCOUNTER — Encounter (HOSPITAL_COMMUNITY): Payer: BC Managed Care – PPO

## 2018-06-22 DIAGNOSIS — I208 Other forms of angina pectoris: Secondary | ICD-10-CM | POA: Diagnosis not present

## 2018-06-24 ENCOUNTER — Encounter (HOSPITAL_COMMUNITY): Payer: BC Managed Care – PPO

## 2018-06-28 ENCOUNTER — Encounter (HOSPITAL_COMMUNITY): Payer: BC Managed Care – PPO

## 2018-06-28 ENCOUNTER — Telehealth (HOSPITAL_COMMUNITY): Payer: Self-pay | Admitting: *Deleted

## 2018-06-28 ENCOUNTER — Other Ambulatory Visit: Payer: Self-pay | Admitting: Physician Assistant

## 2018-06-28 NOTE — Telephone Encounter (Signed)
Contacted patient to notify of Cardiac Rehab department closure x2 weeks. Message left on patient's voicemail. Jocelyn [name], Exercise Physiologist Cardiac and Pulmonary Rehabilitaiton 

## 2018-06-29 ENCOUNTER — Encounter (HOSPITAL_COMMUNITY): Payer: BC Managed Care – PPO

## 2018-07-01 ENCOUNTER — Encounter (HOSPITAL_COMMUNITY): Payer: BC Managed Care – PPO

## 2018-07-05 ENCOUNTER — Encounter (HOSPITAL_COMMUNITY): Payer: BC Managed Care – PPO

## 2018-07-06 ENCOUNTER — Encounter (HOSPITAL_COMMUNITY): Payer: BC Managed Care – PPO

## 2018-07-06 ENCOUNTER — Institutional Professional Consult (permissible substitution): Payer: BC Managed Care – PPO | Admitting: Neurology

## 2018-07-06 ENCOUNTER — Telehealth (HOSPITAL_COMMUNITY): Payer: Self-pay | Admitting: Internal Medicine

## 2018-07-08 ENCOUNTER — Encounter (HOSPITAL_COMMUNITY): Payer: BC Managed Care – PPO

## 2018-07-12 ENCOUNTER — Encounter (HOSPITAL_COMMUNITY): Payer: BC Managed Care – PPO

## 2018-07-13 ENCOUNTER — Encounter (HOSPITAL_COMMUNITY): Payer: BC Managed Care – PPO

## 2018-07-15 ENCOUNTER — Encounter (HOSPITAL_COMMUNITY): Payer: BC Managed Care – PPO

## 2018-07-19 ENCOUNTER — Encounter (HOSPITAL_COMMUNITY): Payer: BC Managed Care – PPO

## 2018-07-20 ENCOUNTER — Encounter (HOSPITAL_COMMUNITY): Payer: BC Managed Care – PPO

## 2018-07-22 ENCOUNTER — Encounter (HOSPITAL_COMMUNITY): Payer: BC Managed Care – PPO

## 2018-07-22 ENCOUNTER — Telehealth (HOSPITAL_COMMUNITY): Payer: Self-pay | Admitting: Cardiac Rehabilitation

## 2018-07-22 NOTE — Telephone Encounter (Signed)
Pt phone call to inform of continued Outpatient Cardiac Rehab departmental closure for COVID 19 precautions. Future opening date to be determined. Pt instructed to continue exercising on his own following home exercise guidelines.Heis continuing to walk on hisown.Pt advised to contact cardiology or PCP PRN symptoms, questions or concerns. Understanding verbalized. Pt denies food insecurity or other needs at this time.  Pt has neighbors who are doing shopping for her. Pt offered emotional support and understanding. Pt expressed gratitude for the call and is interested in resuming CRPII when available.  Andi Hence, RN, BSN Cardiac Pulmonary Rehab

## 2018-07-26 ENCOUNTER — Encounter (HOSPITAL_COMMUNITY): Payer: BC Managed Care – PPO

## 2018-07-27 ENCOUNTER — Encounter (HOSPITAL_COMMUNITY): Payer: BC Managed Care – PPO

## 2018-07-29 ENCOUNTER — Encounter (HOSPITAL_COMMUNITY): Payer: BC Managed Care – PPO

## 2018-08-02 ENCOUNTER — Encounter (HOSPITAL_COMMUNITY): Payer: BC Managed Care – PPO

## 2018-08-03 ENCOUNTER — Encounter (HOSPITAL_COMMUNITY): Payer: BC Managed Care – PPO

## 2018-08-05 ENCOUNTER — Encounter (HOSPITAL_COMMUNITY): Payer: BC Managed Care – PPO

## 2018-08-09 ENCOUNTER — Encounter (HOSPITAL_COMMUNITY): Payer: BC Managed Care – PPO

## 2018-08-10 ENCOUNTER — Encounter (HOSPITAL_COMMUNITY): Payer: BC Managed Care – PPO

## 2018-08-12 ENCOUNTER — Encounter (HOSPITAL_COMMUNITY): Payer: BC Managed Care – PPO

## 2018-08-25 ENCOUNTER — Other Ambulatory Visit: Payer: BC Managed Care – PPO

## 2018-09-02 ENCOUNTER — Telehealth: Payer: Self-pay | Admitting: Neurology

## 2018-09-02 NOTE — Telephone Encounter (Signed)

## 2018-09-08 ENCOUNTER — Encounter: Payer: Self-pay | Admitting: Neurology

## 2018-09-08 NOTE — Telephone Encounter (Signed)
Called and LVM for the patient to call back and review chart for apt scheduled 6/1. I will send the pt a mychart message as well.

## 2018-09-09 NOTE — Telephone Encounter (Signed)
Called the patientto attempt to review her chart with her. LVM advising our office is closed on Friday and that we needed to review her chart prior to apt. Informed her a mychart message was also sent to the patient to complete.   If patient calls back and I am gone for the day (after 12:30) please check with another nurse to see if they can review her chart with her.

## 2018-09-13 ENCOUNTER — Ambulatory Visit (INDEPENDENT_AMBULATORY_CARE_PROVIDER_SITE_OTHER): Payer: BC Managed Care – PPO | Admitting: Neurology

## 2018-09-13 ENCOUNTER — Other Ambulatory Visit: Payer: Self-pay

## 2018-09-13 ENCOUNTER — Encounter: Payer: Self-pay | Admitting: Neurology

## 2018-09-13 DIAGNOSIS — R0602 Shortness of breath: Secondary | ICD-10-CM

## 2018-09-13 DIAGNOSIS — E109 Type 1 diabetes mellitus without complications: Secondary | ICD-10-CM

## 2018-09-13 DIAGNOSIS — R0781 Pleurodynia: Secondary | ICD-10-CM

## 2018-09-13 DIAGNOSIS — R0683 Snoring: Secondary | ICD-10-CM

## 2018-09-13 DIAGNOSIS — K219 Gastro-esophageal reflux disease without esophagitis: Secondary | ICD-10-CM | POA: Diagnosis not present

## 2018-09-13 DIAGNOSIS — I479 Paroxysmal tachycardia, unspecified: Secondary | ICD-10-CM

## 2018-09-13 DIAGNOSIS — Z86711 Personal history of pulmonary embolism: Secondary | ICD-10-CM

## 2018-09-13 DIAGNOSIS — I10 Essential (primary) hypertension: Secondary | ICD-10-CM

## 2018-09-13 DIAGNOSIS — Z9641 Presence of insulin pump (external) (internal): Secondary | ICD-10-CM

## 2018-09-13 NOTE — Progress Notes (Signed)
Virtual Visit via Video Note  I connected with Jocelyn [name] on 09/13/18 at 10:00 AM EDT by a video enabled telemedicine application and verified that I am speaking with the correct person using two identifiers.  Location: Patient: at home Provider: at her office    I discussed the limitations of evaluation and management by telemedicine and the availability of in person appointments. The patient expressed understanding and agreed to proceed.  History of Present Illness:  Patient with history of OSA, had a sleep study at the [facility] in 2010 or earlier. Had mild OSA, no need for intervention.  In the meantime she has significant weight gain, more snoring. She exercises 10K each day and unable to lose weight.        Provider:  Larey Seat, M D  Referring Provider: Haywood Pao, MD Primary Care Physician:  Haywood Pao, MD  No chief complaint on file.   HPI:  Jocelyn [name] is a 53 y.o. female  Is seen here as a referral from Dr. Osborne Casco for a new sleep consult.  The patient is a Firefighter and drinks lots of water, exercises and keeps track track of caloric intake, still unable to lose weight. feel she gained muscle. She is obese and diabetic since age 86 , type !. Insulin pump. GERD, PE history- while on birth control .    Family history: Father has OSA, on CPAP, dementia. Paternal GF had DM.  Mother  has pacemaker, 2 sisters are healthy; age 23, 59.   Social history, unmarried, no children. lives alone, full time employed.  No tobacco use, ETOH sometimes, caffeine - " quit after a PE in 2002", every day dark chocolate at 3 o' clock.    Sleep Habits : 7.30 dinner time, rarely alcohol with dinner.  Bedtime is midnight, falls asleep promptly, sleep position is side ways, she uses 2 pillows ( GERD) . Sleeps for the night, no nocturia- fit bit stated she is awake frequently but she can't recall.  Has Headaches rarely, dry mouth frequently. She  rises at 7.30 AM for work.  Naps in daytime?: almost never.  TST is about 7 hours nightly.   No parasomnia history, but a sleep talker. Sometimes clusters of vivid dreams, she can recall these when they happen.   Review of Systems: Out of a complete 14 system review, the patient complains of only the following symptoms, and all other reviewed systems are negative.   How likely are you to doze in the following situations: 0 = not likely, 1 = slight chance, 2 = moderate chance, 3 = high chance  Sitting and Reading? Watching Television? Sitting inactive in a public place (theater or meeting)? Lying down in the afternoon when circumstances permit? Sitting and talking to someone? Sitting quietly after lunch without alcohol? In a car, while stopped for a few minutes in traffic? As a passenger in a car for an hour without a break?  Total = 7/ 24 points   FSS-  Yes, fatigued - low energy. Higher degree anxiety/ depression- over the month of April/ may- Coronavirus related ? Fear of work, Museum/gallery curator, health and father 's health.     Social History   Socioeconomic History  . Marital status: Single    Spouse name: Not on file  . Number of children: 0  . Years of education: Post-grad  . Highest education level: Not on file  Occupational History  . Occupation: Administrator, sports  Comment: [employer]  Social Needs  . Financial resource strain: Not on file  . Food insecurity:    Worry: Not on file    Inability: Not on file  . Transportation needs:    Medical: Not on file    Non-medical: Not on file  Tobacco Use  . Smoking status: Never Smoker  . Smokeless tobacco: Never Used  Substance and Sexual Activity  . Alcohol use: Yes    Comment: rarely  . Drug use: No  . Sexual activity: Not on file  Lifestyle  . Physical activity:    Days per week: Not on file    Minutes per session: Not on file  . Stress: Not on file  Relationships  . Social connections:    Talks  on phone: Not on file    Gets together: Not on file    Attends religious service: Not on file    Active member of club or organization: Not on file    Attends meetings of clubs or organizations: Not on file    Relationship status: Not on file  . Intimate partner violence:    Fear of current or ex partner: Not on file    Emotionally abused: Not on file    Physically abused: Not on file    Forced sexual activity: Not on file  Other Topics Concern  . Not on file  Social History Narrative   Lives at home alone   Right-handed   Caffeine: chocolate    Family History  Problem Relation Age of Onset  . Irritable bowel syndrome Father   . Gallbladder disease Mother   . Gallbladder disease Other        3 mat. uncles  . Cancer Maternal Grandmother        ovarian  . Colon cancer Neg Hx     Past Medical History:  Diagnosis Date  . Anxiety   . 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 Carpal tunnel syndrome, bilateral 06/02/2016  . Chest pain    RECURRENT WITH NEGITIVE CATHERIZATIONS  . Complication of anesthesia    aspirated and "gained 15pounds, ended up in ICU"  . DM (diabetes mellitus) ([place])   . Dyslipidemia   . Fatty liver   . GERD (gastroesophageal reflux disease)   . History of CT scan    Coronary CTA 3/17: Calcium score 0; no plaque or stenosis noted in coronary arteries  . Hypercholesteremia   . Hypertension   . Migraine   . Obesity   . Pulmonary embolus ([place]) 2002  . Tachycardia    A fib    Past Surgical History:  Procedure Laterality Date  . CARDIAC CATHETERIZATION     x2  . CARPAL TUNNEL RELEASE     right  . CHOLECYSTECTOMY  03/24/2012   Procedure: LAPAROSCOPIC CHOLECYSTECTOMY WITH INTRAOPERATIVE CHOLANGIOGRAM;  Surgeon: Edward Jolly, MD;  Location: [facility];  Service: General;  Laterality: N/A;  . ESOPHAGEAL MANOMETRY N/A 05/28/2015   Procedure: ESOPHAGEAL MANOMETRY (EM);  Surgeon: Irene Shipper, MD;  Location: [facility];  Service:  Gastroenterology;  Laterality: N/A;  . FIBROID TUMOR     UTERUS SURGERY  . LAPAROSCOPIC HYSTERECTOMY      Current Outpatient Medications  Medication Sig Dispense Refill  . Ascorbic Acid (VITAMIN C) 500 MG tablet Take 500 mg by mouth daily.      Marland Kitchen aspirin 81 MG tablet Take 81 mg by mouth at bedtime.     Marland Kitchen  Calcium Carbonate (CALCIUM 500 PO) Take 1,000 mg by mouth daily.     . cetirizine (ZYRTEC) 10 MG tablet Take 10 mg by mouth at bedtime.     . cholecalciferol (VITAMIN D) 1000 units tablet Take 1,000 Units by mouth daily.    . Coenzyme Q10 (CO Q 10) 100 MG CAPS Take 100 mg by mouth daily.     . Cyanocobalamin (VITAMIN B 12) 250 MCG LOZG Take 1 tablet by mouth daily.     . famotidine (PEPCID) 40 MG tablet TAKE 1 TABLET (40 MG TOTAL) BY MOUTH 2 (TWO) TIMES DAILY. TAKE AT 9 AM AND AT BEDTIME. 180 tablet 0  . gabapentin (NEURONTIN) 100 MG capsule Take 1 capsule by mouth daily.    . Insulin Infusion Pump KIT as directed.     Marland Kitchen MAGNESIUM PO Take 1 tablet by mouth daily.     . Multiple Vitamin (MULTIVITAMIN) tablet Take 1 tablet by mouth daily.      . nitroGLYCERIN (NITROSTAT) 0.4 MG SL tablet Place 1 tablet (0.4 mg total) under the tongue every 5 (five) minutes as needed for chest pain. 25 tablet 3  . NOVOLOG 100 UNIT/ML injection as directed.    . pantoprazole (PROTONIX) 40 MG tablet Take 40 mg by mouth See admin instructions. Takes 40 mg every morning, can take a second 40 mg if needed for acid    . Pitavastatin Calcium (LIVALO) 2 MG TABS Take 2 mg by mouth at bedtime.     . ramipril (ALTACE) 2.5 MG capsule Take 2.5 mg by mouth at bedtime.    . sertraline (ZOLOFT) 100 MG tablet Take 100 mg by mouth daily before breakfast.    . sucralfate (CARAFATE) 1 g tablet Take 1 tablet by mouth 3 times daily, 1 hour before or 2 hours after other meds and food. 270 tablet 36  . verapamil (CALAN) 40 MG tablet TAKE 1 TABLET (40 MG TOTAL) BY MOUTH ONCE AS NEEDED FOR UP TO 1 DOSE. 60 tablet 1  . verapamil  (CALAN-SR) 120 MG CR tablet TAKE 1 TABLET (120 MG TOTAL) BY MOUTH AT BEDTIME. 90 tablet 2  . ZETIA 10 MG tablet TAKE 1 TAB (10 mg) BY MOUTH ONCE DAILY AT BEDTIME  3   Current Facility-Administered Medications  Medication Dose Route Frequency Provider Last Rate Last Dose  . 0.9 %  sodium chloride infusion  500 mL Intravenous Once Irene Shipper, MD        Allergies as of 09/13/2018 - Review Complete 05/07/2018  Allergen Reaction Noted  . Pumpkin flavor Anaphylaxis 04/04/2015  . Salmon [fish allergy] Anaphylaxis 04/04/2015  . Crestor [rosuvastatin calcium] Other (See Comments) 12/21/2015  . Lipitor [atorvastatin] Other (See Comments) 12/21/2015  . Sulfadoxine    . Sulfonamide derivatives Other (See Comments)   . Morphine and related Rash 04/04/2015    Vitals: There were no vitals taken for this visit. Last Weight:  Wt Readings from Last 1 Encounters:  05/07/18 168 lb (76.2 kg)   Last Height:   Ht Readings from Last 1 Encounters:  05/07/18 '5\' 2"'$  (1.575 m)    Observations/Objective: General: The patient is awake, alert and appears not in acute distress. The patient is well groomed. Head: Normocephalic, atraumatic. Neck is supple. Mallampati 3,  neck circumference:*15"   Respiratory: able to hold hold her breath over 21 seconds.   Skin:  Without evidence of edema, or rash Trunk: BMI is 163/ 5.2" =30 kg/ m2  Neurologic exam : The patient  is awake and alert, oriented to place and time.   Memory subjective described as intact.  There is a normal attention span & concentration ability. Speech is fluent without  dysarthria, dysphonia or aphasia. Mood and affect are appropriate.  Cranial nerves: Pupils are equal. Extraocular movements  in vertical and horizontal planes intact and without nystagmus.  Visual fields by finger perimetry are intact. Hearing to finger rub intact.  Facial sensation intact to fine touch. Facial motor strength is symmetric and tongue and uvula move  midline. Tongue protrusion into either cheek is normal. Shoulder shrug is normal.  Motor exam:  Normal muscle bulk and symmetric ROM in all extremities. Right handed.  Coordination: Rapid alternating movements in the fingers/hands were normal. Handwriting has changed, larger amplitude.  Finger-to-nose maneuver without evidence of ataxia, dysmetria or tremor. Gait and station: Patient walks without assistive device.   Assessment:  After physical and neurologic examination, review of laboratory studies, imaging, neurophysiology testing and pre-existing records, assessment is that of      Waking up with a dry mouth, sign that she is probably snoring, may have apnea. Has gained weight.   Plan:  Treatment plan and additional workup :  We can start with a HST to screen for apnea, she is a higher risk patient due to DM type 1.   If BCBS doesn't permit at home , will get her into the lab.   Follow Up Instructions: 3 month RV     I discussed the assessment and treatment plan with the patient. The patient was provided an opportunity to ask questions and all were answered. The patient agreed with the plan and demonstrated an understanding of the instructions.   The patient was advised to call back or seek an in-person evaluation if the symptoms worsen or if the condition fails to improve as anticipated.  I provided 28 minutes of non-face-to-face time during this encounter.   Larey Seat, MD   Larey Seat MD 09/13/2018

## 2018-09-13 NOTE — Patient Instructions (Signed)

## 2018-09-15 ENCOUNTER — Other Ambulatory Visit: Payer: Self-pay

## 2018-09-15 ENCOUNTER — Other Ambulatory Visit: Payer: BC Managed Care – PPO

## 2018-09-15 MED ORDER — FAMOTIDINE 40 MG PO TABS: 40.0000 mg | ORAL_TABLET | Freq: Two times a day (BID) | ORAL | 0 refills | Status: DC

## 2018-09-24 ENCOUNTER — Other Ambulatory Visit: Payer: Self-pay | Admitting: Physician Assistant

## 2018-10-04 ENCOUNTER — Other Ambulatory Visit: Payer: Self-pay

## 2018-10-04 ENCOUNTER — Ambulatory Visit
Admission: RE | Admit: 2018-10-04 | Discharge: 2018-10-04 | Disposition: A | Discharge status: Home / Self Care | Payer: BC Managed Care – PPO | Source: Ambulatory Visit | Attending: Urology | Admitting: Urology

## 2018-10-04 DIAGNOSIS — R300 Dysuria: Secondary | ICD-10-CM

## 2018-10-04 DIAGNOSIS — R1031 Right lower quadrant pain: Secondary | ICD-10-CM

## 2018-10-04 DIAGNOSIS — G8929 Other chronic pain: Secondary | ICD-10-CM

## 2018-10-12 ENCOUNTER — Other Ambulatory Visit: Payer: Self-pay | Admitting: Obstetrics and Gynecology

## 2018-10-12 ENCOUNTER — Telehealth (HOSPITAL_COMMUNITY): Payer: Self-pay | Admitting: *Deleted

## 2018-10-12 DIAGNOSIS — N6310 Unspecified lump in the right breast, unspecified quadrant: Secondary | ICD-10-CM

## 2018-10-12 NOTE — Telephone Encounter (Signed)
Left message to update patient on the status of the maintenance Cardiac Rehab exercise classes.  At this point they remain on hold due to the COVID-19 pandemic precautions.

## 2018-10-14 MED ORDER — NITROGLYCERIN 0.4 MG SL SUBL: 0.4000 mg | SUBLINGUAL_TABLET | SUBLINGUAL | 3 refills | Status: DC | PRN

## 2018-10-18 ENCOUNTER — Ambulatory Visit
Admission: RE | Admit: 2018-10-18 | Discharge: 2018-10-18 | Disposition: A | Discharge status: Home / Self Care | Payer: BC Managed Care – PPO | Source: Ambulatory Visit | Attending: Obstetrics and Gynecology | Admitting: Obstetrics and Gynecology

## 2018-10-18 DIAGNOSIS — N6310 Unspecified lump in the right breast, unspecified quadrant: Secondary | ICD-10-CM

## 2018-10-21 ENCOUNTER — Ambulatory Visit (INDEPENDENT_AMBULATORY_CARE_PROVIDER_SITE_OTHER): Payer: BC Managed Care – PPO | Admitting: Neurology

## 2018-10-21 ENCOUNTER — Other Ambulatory Visit: Payer: Self-pay

## 2018-10-21 DIAGNOSIS — G4710 Hypersomnia, unspecified: Secondary | ICD-10-CM

## 2018-10-21 DIAGNOSIS — Z9641 Presence of insulin pump (external) (internal): Secondary | ICD-10-CM

## 2018-10-21 DIAGNOSIS — K219 Gastro-esophageal reflux disease without esophagitis: Secondary | ICD-10-CM

## 2018-10-21 DIAGNOSIS — I10 Essential (primary) hypertension: Secondary | ICD-10-CM

## 2018-10-21 DIAGNOSIS — Z86711 Personal history of pulmonary embolism: Secondary | ICD-10-CM

## 2018-10-21 DIAGNOSIS — E109 Type 1 diabetes mellitus without complications: Secondary | ICD-10-CM

## 2018-10-21 DIAGNOSIS — R0602 Shortness of breath: Secondary | ICD-10-CM

## 2018-10-21 DIAGNOSIS — I479 Paroxysmal tachycardia, unspecified: Secondary | ICD-10-CM

## 2018-10-21 DIAGNOSIS — R0781 Pleurodynia: Secondary | ICD-10-CM

## 2018-10-29 DIAGNOSIS — Z86711 Personal history of pulmonary embolism: Secondary | ICD-10-CM | POA: Insufficient documentation

## 2018-10-29 NOTE — Procedures (Signed)
PATIENT'S NAME:  Jocelyn [name], Jocelyn [name] DOB:      07-May-1965      MR#:    [id number]     DATE OF RECORDING: 10/21/2018 MR REFERRING M.D.:  Domenick Gong, MD Study Performed:   Baseline Polysomnogram HISTORY:  Jocelyn [name] is a 53 y.o. female patient of Dr. Alfonso Patten. [place] and was seen in a video Televisit on 09-13-2018. Goal: Evaluation for possible OSA to see if sleep disorder plays a role in her lack of weight loss.  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 She feels she gained muscle. She is obese and diabetic since age 55, type 1 with an Insulin pump. Further history: GERD, Migraines, NASH, HTN, elevated cholesterol, Atrial fibrillation, PE history- while on hormonal contraceptive, also some stress and anxiety about father's health recently. Father has OSA on CPAP. She wakes up with a dry mouth.  The patient endorsed the Epworth Sleepiness Scale at 7/24 points.   The patient's weight 168 pounds with a height of 62 (inches), resulting in a BMI of 30.8 kg/m2. The patient's neck circumference measured 15 inches.  CURRENT MEDICATIONS: Vitamin C, Aspirin, Calcium, Zyrtec, Vitamin D, CoQ10, Vitamin B, Pepcid, Neurontin, Magnesium, Multivitamin, Nitrostat, Novolog, Protonix, , Livalo, Altace, Zoloft, Carafate, Calan, Calan-SR, Zetia   PROCEDURE:  This is a multichannel digital polysomnogram utilizing the Somnostar 11.2 system.  Electrodes and sensors were applied and monitored per AASM Specifications.   EEG, EOG, Chin and Limb EMG, were sampled at 200 Hz.  ECG, Snore and Nasal Pressure, Thermal Airflow, Respiratory Effort, CPAP Flow and Pressure, Oximetry was sampled at 50 Hz. Digital video and audio were recorded.      BASELINE STUDY: Lights Out was at 22:07 and Lights On at 04:45.  Total recording time (TRT) was 398.5 minutes, with a total sleep time (TST) of 346 minutes.   The patient's sleep latency was 42 minutes.  REM  latency was 173.5 minutes.  The sleep efficiency was 86.8 %.     SLEEP ARCHITECTURE: WASO (Wake after sleep onset) was 14.5 minutes.  There were 2 minutes in Stage N1, 212 minutes Stage N2, 96 minutes Stage N3 and 36 minutes in Stage REM.  The percentage of Stage N1 was .6%, Stage N2 was 61.3%, Stage N3 was 27.7% and Stage R (REM sleep) was 10.4%.  RESPIRATORY ANALYSIS:  There were a total of 29 respiratory events:  14 obstructive apneas, 0 central apneas and 1 mixed apnea with 14 hypopneas. The total APNEA/HYPOPNEA INDEX (AHI) was 5.0/hour.  2 events occurred in REM sleep and 27 events in NREM. The REM AHI was 3.3 /hour, versus a non-REM AHI of 5.2. The patient spent 35 minutes of total sleep time in the supine position and 311 minutes in non-supine. The supine AHI was 10.3/h versus a non-supine AHI of 4.4/h.  OXYGEN SATURATION & C02:  The Wake baseline 02 saturation was 98%, with the lowest being 87%. Time spent below 89% saturation equaled 4 minutes. The arousals were noted as: 58 were spontaneous, 0 were associated with PLMs, and 16 were associated with respiratory events. The patient had a total of 0 Periodic Limb Movements.   Audio and video analysis did not show any abnormal or unusual movements, behaviors, phonations or vocalizations.  The patient moved from side to side, had intermittent snoring and coughing.  EKG was in keeping with normal sinus rhythm (NSR). Post-study, the patient indicated  that sleep was shorter and less restful than usual.    IMPRESSION:  1. Very, very mild Obstructive Sleep Apnea (OSA) at AHI 5/h with intermittent snoring, loudest while in supine, not associated with REM sleep.  2. Normal EKG, EEG and EMG. No hypoxemia.  3. Spontaneous arousals were unrelated to REM sleep and very brief.    RECOMMENDATIONS:  There is non-continuous snoring and mild OSA, allowing for a treatment of choice. CPAP may be too much, but a dental device could be helpful, and avoiding  supine sleep will help as well.     I certify that I have reviewed the entire raw data recording prior to the issuance of this report in accordance with the Standards of Accreditation of the American Academy of Sleep Medicine (AASM)      Larey Seat, MD   10-28-2018 Diplomat, American Board of Psychiatry and Neurology  Diplomat, American Board of [place] Director, [facility] [employer]

## 2018-11-01 ENCOUNTER — Telehealth: Payer: Self-pay | Admitting: Neurology

## 2018-11-01 NOTE — Telephone Encounter (Signed)
-----   Message from Larey Seat, MD sent at 10/29/2018  2:02 PM EDT ----- 1. Very, very mild Obstructive Sleep Apnea (OSA) at AHI 5/h with  intermittent snoring, loudest while in supine, not associated  with REM sleep.  2. Normal EKG, EEG and EMG. No hypoxemia.  3. Spontaneous arousals were unrelated to REM sleep and very  brief.   The Patient can follow up if desired, I recommend to use a dental device to control mild degree of OSA, UARS and snoring. Larey Seat, MD

## 2018-11-01 NOTE — Telephone Encounter (Signed)
Called the patient and reviewed her sleep study results. Advised the patient of the mild sleep apnea and the normal findings. Advised the patient about the ? Dentist referral and she is in process of completing invisline through her dentist. Advised the patient the next thing would be monitoring her avoiding sleeping on her back. Pt verbalized understanding. Pt had no questions at this time but was encouraged to call back if questions arise. Advised that if she decides to schedule a follow up visit we can certainly get her scheduled for that as well. Pt verbalized understanding.

## 2018-12-12 ENCOUNTER — Other Ambulatory Visit: Payer: Self-pay | Admitting: Internal Medicine

## 2018-12-13 ENCOUNTER — Other Ambulatory Visit: Payer: Self-pay | Admitting: Internal Medicine

## 2019-01-08 ENCOUNTER — Other Ambulatory Visit: Payer: Self-pay | Admitting: Internal Medicine

## 2019-01-11 ENCOUNTER — Ambulatory Visit: Payer: BC Managed Care – PPO | Admitting: Internal Medicine

## 2019-02-22 ENCOUNTER — Other Ambulatory Visit: Payer: Self-pay

## 2019-02-22 ENCOUNTER — Ambulatory Visit (INDEPENDENT_AMBULATORY_CARE_PROVIDER_SITE_OTHER): Payer: BC Managed Care – PPO | Admitting: Internal Medicine

## 2019-02-22 ENCOUNTER — Encounter: Payer: Self-pay | Admitting: Internal Medicine

## 2019-02-22 VITALS — BP 110/64 | HR 91 | Ht 62.0 in | Wt 168.0 lb

## 2019-02-22 DIAGNOSIS — G909 Disorder of the autonomic nervous system, unspecified: Secondary | ICD-10-CM | POA: Diagnosis not present

## 2019-02-22 DIAGNOSIS — I10 Essential (primary) hypertension: Secondary | ICD-10-CM

## 2019-02-22 DIAGNOSIS — R002 Palpitations: Secondary | ICD-10-CM

## 2019-02-22 NOTE — Progress Notes (Signed)
HPI [name] returns today for followup. She is a pleasant middle aged woman with longstanding DM, who has been bothered by chronic chest pain. She does not have CAD though probably has some microvascular disease and possibly some spasm. She had gained weight during Covid. She remains active, exercising regularly.  Allergies  Allergen Reactions  . Pumpkin Flavor Anaphylaxis    Squash also  . Salmon [Fish Allergy] Anaphylaxis  . Crestor [Rosuvastatin Calcium] Other (See Comments)    Muscle pain  . Lipitor [Atorvastatin] Other (See Comments)    Muscle pain  . Sulfadoxine   . Sulfonamide Derivatives Other (See Comments)    REACTION: GI distress  . Morphine And Related Rash     Current Outpatient Medications  Medication Sig Dispense Refill  . Ascorbic Acid (VITAMIN C) 500 MG tablet Take 500 mg by mouth daily.      Marland Kitchen aspirin 81 MG tablet Take 81 mg by mouth at bedtime.     . Calcium Carbonate (CALCIUM 500 PO) Take 1,000 mg by mouth daily.     . cetirizine (ZYRTEC) 10 MG tablet Take 10 mg by mouth at bedtime.     . cholecalciferol (VITAMIN D) 1000 units tablet Take 1,000 Units by mouth daily.    . Coenzyme Q10 (CO Q 10) 100 MG CAPS Take 100 mg by mouth daily.     . Cyanocobalamin (VITAMIN B 12) 250 MCG LOZG Take 1 tablet by mouth daily.     . famotidine (PEPCID) 40 MG tablet TAKE 1 TABLET (40 MG TOTAL) BY MOUTH 2 (TWO) TIMES DAILY. TAKE AT 9 AM AND AT BEDTIME. 180 tablet 1  . gabapentin (NEURONTIN) 100 MG capsule Take 1 capsule by mouth daily.    . Insulin Infusion Pump KIT as directed.     Marland Kitchen MAGNESIUM PO Take 1 tablet by mouth daily.     . Multiple Vitamin (MULTIVITAMIN) tablet Take 1 tablet by mouth daily.      . nitroGLYCERIN (NITROSTAT) 0.4 MG SL tablet PLACE 1 TABLET (0.4 MG TOTAL) UNDER THE TONGUE EVERY 5 (FIVE) MINUTES AS NEEDED FOR CHEST PAIN. 75 tablet 1  . NOVOLOG 100 UNIT/ML injection as directed.    . pantoprazole (PROTONIX) 40 MG tablet Take 40 mg by mouth See admin  instructions. Takes 40 mg every morning, can take a second 40 mg if needed for acid    . Pitavastatin Calcium (LIVALO) 2 MG TABS Take 2 mg by mouth at bedtime.     . ramipril (ALTACE) 2.5 MG capsule Take 2.5 mg by mouth at bedtime.    . sertraline (ZOLOFT) 100 MG tablet Take 100 mg by mouth daily before breakfast.    . sucralfate (CARAFATE) 1 g tablet Take 1 tablet by mouth 3 times daily, 1 hour before or 2 hours after other meds and food. 270 tablet 36  . verapamil (CALAN) 40 MG tablet TAKE 1 TABLET (40 MG TOTAL) BY MOUTH ONCE AS NEEDED FOR UP TO 1 DOSE. 60 tablet 1  . verapamil (CALAN-SR) 120 MG CR tablet Take 1 tablet (120 mg total) by mouth at bedtime. Please keep upcoming appt in November with Dr. Lovena Le for future refills. Thank you 90 tablet 0  . ZETIA 10 MG tablet TAKE 1 TAB (10 mg) BY MOUTH ONCE DAILY AT BEDTIME  3   Current Facility-Administered Medications  Medication Dose Route Frequency Provider Last Rate Last Dose  . 0.9 %  sodium chloride infusion  500 mL Intravenous  Once Irene Shipper, MD         Past Medical History:  Diagnosis Date  . Anxiety   . 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 Carpal tunnel syndrome, bilateral 06/02/2016  . Chest pain    RECURRENT WITH NEGITIVE CATHERIZATIONS  . Complication of anesthesia    aspirated and "gained 15pounds, ended up in ICU"  . DM (diabetes mellitus) ([place])   . Dyslipidemia   . Fatty liver   . GERD (gastroesophageal reflux disease)   . History of CT scan    Coronary CTA 3/17: Calcium score 0; no plaque or stenosis noted in coronary arteries  . Hypercholesteremia   . Hypertension   . Migraine   . Obesity   . Pulmonary embolus ([place]) 2002  . Tachycardia    A fib    ROS:   All systems reviewed and negative except as noted in the HPI.   Past Surgical History:  Procedure Laterality Date  . CARDIAC CATHETERIZATION     x2  . CARPAL TUNNEL RELEASE     right  . CHOLECYSTECTOMY  03/24/2012   Procedure: LAPAROSCOPIC  CHOLECYSTECTOMY WITH INTRAOPERATIVE CHOLANGIOGRAM;  Surgeon: Edward Jolly, MD;  Location: [facility];  Service: General;  Laterality: N/A;  . ESOPHAGEAL MANOMETRY N/A 05/28/2015   Procedure: ESOPHAGEAL MANOMETRY (EM);  Surgeon: Irene Shipper, MD;  [facility];  Service: Gastroenterology;  Laterality: N/A;  . FIBROID TUMOR     UTERUS SURGERY  . LAPAROSCOPIC HYSTERECTOMY       Family History  Problem Relation Age of Onset  . Irritable bowel syndrome Father   . Gallbladder disease Mother   . Gallbladder disease Other        3 mat. uncles  . Cancer Maternal Grandmother        ovarian  . Colon cancer Neg Hx      Social History   Socioeconomic History  . Marital status: Single    Spouse name: Not on file  . Number of children: 0  . Years of education: Post-grad  . Highest education level: Not on file  Occupational History  . Occupation: registered dietician    Comment: [employer]  Social Needs  . Financial resource strain: Not on file  . Food insecurity    Worry: Not on file    Inability: Not on file  . Transportation needs    Medical: Not on file    Non-medical: Not on file  Tobacco Use  . Smoking status: Never Smoker  . Smokeless tobacco: Never Used  Substance and Sexual Activity  . Alcohol use: Yes    Comment: rarely  . Drug use: No  . Sexual activity: Not on file  Lifestyle  . Physical activity    Days per week: Not on file    Minutes per session: Not on file  . Stress: Not on file  Relationships  . Social Herbalist on phone: Not on file    Gets together: Not on file    Attends religious service: Not on file    Active member of club or organization: Not on file    Attends meetings of clubs or organizations: Not on file    Relationship status: Not on file  . Intimate partner violence    Fear of current or ex partner: Not on file    Emotionally abused: Not on file    Physically abused: Not on file  Forced sexual  activity: Not on file  Other Topics Concern  . Not on file  Social History Narrative   Lives at home alone   Right-handed   Caffeine: chocolate     BP 110/64   Pulse 91   Ht '5\' 2"'$  (1.575 m)   Wt 168 lb (76.2 kg)   SpO2 98%   BMI 30.73 kg/m   Physical Exam:  Well appearing NAD HEENT: Unremarkable Neck:  No JVD, no thyromegally Lymphatics:  No adenopathy Back:  No CVA tenderness Lungs:  Clear with no wheezes HEART:  Regular rate rhythm, no murmurs, no rubs, no clicks Abd:  soft, positive bowel sounds, no organomegally, no rebound, no guarding Ext:  2 plus pulses, no edema, no cyanosis, no clubbing Skin:  No rashes no nodules Neuro:  CN II through XII intact, motor grossly intact  EKG - nsr   Assess/Plan: 1. Palpitations - these are mostly controlled.  2. HTN - her bp is well controlled.  3. Chest pain - she has occaisional twinges of pain, non-exertional lasting seconds. I reassured her that this was not heart related.   Mikle Bosworth.D.

## 2019-02-22 NOTE — Patient Instructions (Signed)

## 2019-03-14 ENCOUNTER — Other Ambulatory Visit: Payer: Self-pay | Admitting: Internal Medicine

## 2019-03-16 MED ORDER — VERAPAMIL HCL ER 120 MG PO TBCR: 120.0000 mg | EXTENDED_RELEASE_TABLET | Freq: Every day | ORAL | 3 refills | Status: DC

## 2019-05-03 MED ORDER — VERAPAMIL HCL 40 MG PO TABS: 40.0000 mg | ORAL_TABLET | Freq: Once | ORAL | 1 refills | Status: DC | PRN

## 2019-05-31 ENCOUNTER — Other Ambulatory Visit: Payer: Self-pay | Admitting: Internal Medicine

## 2019-06-01 ENCOUNTER — Telehealth: Payer: Self-pay | Admitting: Internal Medicine

## 2019-06-01 MED ORDER — VERAPAMIL HCL ER 120 MG PO TBCR: 120.0000 mg | EXTENDED_RELEASE_TABLET | Freq: Every day | ORAL | 3 refills | Status: DC

## 2019-06-01 MED ORDER — VERAPAMIL HCL 40 MG PO TABS: 40.0000 mg | ORAL_TABLET | Freq: Every day | ORAL | 1 refills | Status: DC | PRN

## 2019-06-01 NOTE — Telephone Encounter (Signed)
New Message   Pt c/o medication issue:  1. Name of Medication: verapamil (CALAN) 40 MG tablet    2. How are you currently taking this medication (dosage and times per day)?   3. Are you having a reaction (difficulty breathing--STAT)?   4. What is your medication issue? Lennette Bihari with [facility] is calling because he needs clarify the instructions for how the patient should be taking the medication.

## 2019-06-01 NOTE — Telephone Encounter (Signed)
Corrected medication and resubmitted to [facility].  Jocelyn [name] states no further action needed.

## 2019-06-01 NOTE — Addendum Note (Signed)
Addended by: Willeen Cass A on: 06/01/2019 12:20 PM   Modules accepted: Orders

## 2019-06-11 ENCOUNTER — Ambulatory Visit: Payer: BC Managed Care – PPO | Attending: Internal Medicine

## 2019-06-11 DIAGNOSIS — Z23 Encounter for immunization: Secondary | ICD-10-CM | POA: Insufficient documentation

## 2019-06-11 NOTE — Progress Notes (Signed)
   Covid-19 Vaccination Clinic  Name:  Jocelyn [name]    MRN: [id number] DOB: 1965-05-25  06/11/2019  [name] was observed post Covid-19 immunization for 15 minutes without incidence. She was provided with Vaccine Information Sheet and instruction to access the V-Safe system.   [name] was instructed to call 911 with any severe reactions post vaccine: Marland Kitchen Difficulty breathing  . Swelling of your face and throat  . A fast heartbeat  . A bad rash all over your body  . Dizziness and weakness    Immunizations Administered    Name Date Dose VIS Date Route   Pfizer COVID-19 Vaccine 06/11/2019  5:18 PM 0.3 mL 03/25/2019 Intramuscular   Manufacturer: [place]   Lot: [id number]   [place]: [id number]

## 2020-01-03 ENCOUNTER — Telehealth: Payer: Self-pay | Admitting: Neurology

## 2020-01-03 NOTE — Telephone Encounter (Signed)
She saw Dr. Jannifer Franklin in 2019 I would prefer her to stay with him for this referral thanks

## 2020-01-03 NOTE — Telephone Encounter (Signed)
She saw Dr. Jannifer Franklin in 2017 and 2018 and he has already seen her for this complaint I would prefer her to stay with Dr. Jannifer Franklin for this referral thanks

## 2020-01-03 NOTE — Telephone Encounter (Signed)
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 Osborne Casco. She's seen Dr. Jannifer Franklin in the past, as well as Dr. Brett Fairy for sleep. She's requesting to see Dr. Jaynee Eagles for this. Would you both be ok with this?

## 2020-03-06 ENCOUNTER — Other Ambulatory Visit: Payer: Self-pay

## 2020-03-06 ENCOUNTER — Ambulatory Visit: Payer: BC Managed Care – PPO | Admitting: Internal Medicine

## 2020-03-06 ENCOUNTER — Encounter: Payer: Self-pay | Admitting: Internal Medicine

## 2020-03-06 VITALS — BP 108/66 | HR 98 | Ht 62.0 in | Wt 169.6 lb

## 2020-03-06 DIAGNOSIS — R002 Palpitations: Secondary | ICD-10-CM

## 2020-03-06 DIAGNOSIS — I10 Essential (primary) hypertension: Secondary | ICD-10-CM | POA: Diagnosis not present

## 2020-03-06 NOTE — Progress Notes (Signed)
HPI [name] returns today for ongoing evaluation and management of chest pain, dysautonomia and Obesity. She has done well in the interim. Minimal chest pain. No sob. She is exercising. Her A1C was 7.8. No syncope. She has had to take short acting verapamil 4 times.  Allergies  Allergen Reactions   Pumpkin Flavor Anaphylaxis    Squash also   Salmon [Fish Allergy] Anaphylaxis   Crestor [Rosuvastatin Calcium] Other (See Comments)    Muscle pain   Lipitor [Atorvastatin] Other (See Comments)    Muscle pain   Sulfadoxine    Sulfonamide Derivatives Other (See Comments)    REACTION: GI distress   Morphine And Related Rash     Current Outpatient Medications  Medication Sig Dispense Refill   Ascorbic Acid (VITAMIN C) 500 MG tablet Take 500 mg by mouth daily.       aspirin 81 MG tablet Take 81 mg by mouth at bedtime.      Calcium Carbonate (CALCIUM 500 PO) Take 1,000 mg by mouth daily.      cholecalciferol (VITAMIN D) 1000 units tablet Take 1,000 Units by mouth daily.     Coenzyme Q10 (CO Q 10) 100 MG CAPS Take 100 mg by mouth daily.      famotidine (PEPCID) 40 MG tablet TAKE 1 TABLET (40 MG TOTAL) BY MOUTH 2 (TWO) TIMES DAILY. TAKE AT 9 AM AND AT BEDTIME. 180 tablet 1   gabapentin (NEURONTIN) 100 MG capsule Take 1 capsule by mouth daily.     Insulin Infusion Pump KIT as directed.      MAGNESIUM PO Take 1 tablet by mouth daily.      Multiple Vitamin (MULTIVITAMIN) tablet Take 1 tablet by mouth daily.       nitroGLYCERIN (NITROSTAT) 0.4 MG SL tablet PLACE 1 TABLET (0.4 MG TOTAL) UNDER THE TONGUE EVERY 5 (FIVE) MINUTES AS NEEDED FOR CHEST PAIN. 75 tablet 1   NOVOLOG 100 UNIT/ML injection as directed.     pantoprazole (PROTONIX) 40 MG tablet Take 40 mg by mouth See admin instructions. Takes 40 mg every morning, can take a second 40 mg if needed for acid     Pitavastatin Calcium (LIVALO) 2 MG TABS Take 2 mg by mouth at bedtime.      ramipril (ALTACE) 2.5 MG  capsule Take 2.5 mg by mouth at bedtime.     sertraline (ZOLOFT) 100 MG tablet Take 100 mg by mouth daily before breakfast.     verapamil (CALAN) 40 MG tablet Take 1 tablet (40 mg total) by mouth daily as needed. 90 tablet 1   verapamil (CALAN-SR) 120 MG CR tablet Take 1 tablet (120 mg total) by mouth at bedtime. 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   Past Surgical History:  Procedure Laterality Date   CARDIAC CATHETERIZATION     x2   CARPAL TUNNEL RELEASE     right   CHOLECYSTECTOMY  03/24/2012   Procedure: LAPAROSCOPIC CHOLECYSTECTOMY WITH INTRAOPERATIVE CHOLANGIOGRAM;  Surgeon: Edward Jolly, MD;  Location: [facility];  Service: General;  Laterality: N/A;   ESOPHAGEAL MANOMETRY N/A 05/28/2015   Procedure: ESOPHAGEAL MANOMETRY (EM);  Surgeon: Irene Shipper, MD;  [facility];  Service: Gastroenterology;  Laterality: N/A;   FIBROID TUMOR     UTERUS SURGERY   LAPAROSCOPIC HYSTERECTOMY       Family History  Problem Relation  Age of Onset   Irritable bowel syndrome Father    Gallbladder disease Mother    Gallbladder disease Other        3 mat. uncles   Cancer Maternal Grandmother        ovarian   Colon cancer Neg Hx      Social History   Socioeconomic History   Marital status: Single    Spouse name: Not on file   Number of children: 0   Years of education: Post-grad   Highest education level: Not on file  Occupational History   Occupation: registered dietician    Comment: [employer]  Tobacco Use   Smoking status: Never Smoker   Smokeless tobacco: Never Used  Scientific laboratory technician Use: Never used  Substance and Sexual Activity   Alcohol use: Yes    Comment: rarely   Drug use: No   Sexual activity: Not on file  Other Topics Concern   Not on file  Social History Narrative   Lives at home alone   Right-handed   Caffeine: chocolate   Social Determinants of Health   Financial Resource Strain:    Difficulty of Paying Living Expenses: Not on file  Food Insecurity:    Worried About Charity fundraiser in the Last Year: Not on file   [employer] of Food in the Last Year: Not on file  Transportation Needs:    Lack of Transportation (Medical): Not on file   Lack of Transportation (Non-Medical): Not on file  Physical Activity:    Days of Exercise per Week: Not on file   Minutes of Exercise per Session: Not on file  Stress:    Feeling of Stress : Not on file  Social Connections:    Frequency of Communication with Friends and Family: Not on file   Frequency of Social Gatherings with Friends and Family: Not on file   Attends Religious Services: Not on file   Active Member of Clubs or Organizations: Not on file   Attends Archivist Meetings: Not on file   Marital Status: Not on file  Intimate Partner Violence:    Fear of Current or Ex-Partner: Not on file   Emotionally Abused: Not on file   Physically Abused: Not on file   Sexually Abused:  Not on file     BP 108/66    Pulse 98  Ht '5\' 2"'$  (1.575 m)    Wt 169 lb 9.6 oz (76.9 kg)    SpO2 98%    BMI 31.02 kg/m   Physical Exam:  Well appearing middle aged woman, NAD HEENT: Unremarkable Neck:  No JVD, no thyromegally Lymphatics:  No adenopathy Back:  No CVA tenderness Lungs:  Clear with no wheezes HEART:  Regular rate rhythm, no murmurs, no rubs, no clicks Abd:  soft, positive bowel sounds, no organomegally, no rebound, no guarding Ext:  2 plus pulses, no edema, no cyanosis, no clubbing Skin:  No rashes no nodules Neuro:  CN II through XII intact, motor grossly intact  EKG - nsr   Assess/Plan: 1. Non-cardiac chest pain - her symptoms are well controlled. She will continue her verapamil. 2. Obesity - I encouraged her to lose 10 lbs. 3. Dyslipidemia - she is statin intolerant but will continue zetia.  Carleene Overlie Eshaan Titzer,MD

## 2020-03-06 NOTE — Patient Instructions (Signed)

## 2020-03-25 ENCOUNTER — Other Ambulatory Visit: Payer: Self-pay | Admitting: Internal Medicine

## 2020-03-29 ENCOUNTER — Other Ambulatory Visit: Payer: Self-pay | Admitting: Internal Medicine

## 2020-05-01 ENCOUNTER — Institutional Professional Consult (permissible substitution): Payer: BC Managed Care – PPO | Admitting: Neurology

## 2020-06-13 ENCOUNTER — Ambulatory Visit: Payer: BC Managed Care – PPO | Admitting: Neurology

## 2020-06-13 ENCOUNTER — Encounter: Payer: Self-pay | Admitting: Neurology

## 2020-06-13 VITALS — BP 151/72 | HR 120 | Ht 62.0 in | Wt 172.4 lb

## 2020-06-13 DIAGNOSIS — G5603 Carpal tunnel syndrome, bilateral upper limbs: Secondary | ICD-10-CM

## 2020-06-13 NOTE — Progress Notes (Signed)
8

## 2020-06-13 NOTE — Progress Notes (Signed)
Reason for visit: Bilateral carpal tunnel syndrome  Referring physician: Dr. Candise Bowens is a 55 y.o. female  History of present illness:  Ms. Coyne is a 55 year old right-handed white female with a history of type 1 diabetes.  The patient has a insulin pump in place.  She has noted over the last several months that occasionally she may have a low blood sugar episode, her blood sugars may drop down to the 50 range and with this she may have some tingling around the lips.  The patient reported no focal numbness or weakness of the arms or legs.  The patient has noted some changes in balance, she has started engaging in Pilates and she believes that her balance has improved some.  She denies any issues controlling the bowels or the bladder.  She has been seen in 2017 with some numbness of the extremities including the feet, this has resolved.  She still has some symptoms with carpal tunnel syndrome.  She returns to the office today for further evaluation.  Past Medical History:  Diagnosis Date  . Anxiety   . 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 Carpal tunnel syndrome, bilateral 06/02/2016  . Chest pain    RECURRENT WITH NEGITIVE CATHERIZATIONS  . Complication of anesthesia    aspirated and "gained 15pounds, ended up in ICU"  . DM (diabetes mellitus) ([place])   . Dyslipidemia   . Fatty liver   . GERD (gastroesophageal reflux disease)   . History of CT scan    Coronary CTA 3/17: Calcium score 0; no plaque or stenosis noted in coronary arteries  . Hypercholesteremia   . Hypertension   . Migraine   . Obesity   . Pulmonary embolus ([place]) 2002  . Tachycardia    A fib    Past Surgical History:  Procedure Laterality Date  . CARDIAC CATHETERIZATION     x2  . CARPAL TUNNEL RELEASE     right  . CHOLECYSTECTOMY  03/24/2012   Procedure: LAPAROSCOPIC CHOLECYSTECTOMY WITH INTRAOPERATIVE CHOLANGIOGRAM;  Surgeon: Edward Jolly, MD;  [facility];  Service: General;   Laterality: N/A;  . ESOPHAGEAL MANOMETRY N/A 05/28/2015   Procedure: ESOPHAGEAL MANOMETRY (EM);  Surgeon: Irene Shipper, MD;  [facility];  Service: Gastroenterology;  Laterality: N/A;  . FIBROID TUMOR     UTERUS SURGERY  . LAPAROSCOPIC HYSTERECTOMY      Family History  Problem Relation Age of Onset  . Irritable bowel syndrome Father   . Gallbladder disease Mother   . Gallbladder disease Other        3 mat. uncles  . Cancer Maternal Grandmother        ovarian  . Colon cancer Neg Hx     Social history:  reports that she has never smoked. She has never used smokeless tobacco. She reports current alcohol use. She reports that she does not use drugs.  Medications:  Prior to Admission medications   Medication Sig Start Date End Date Taking? Authorizing Provider  Ascorbic Acid (VITAMIN C) 500 MG tablet Take 500 mg by mouth daily.   Yes [provider]  aspirin 81 MG tablet Take 81 mg by mouth at bedtime.   Yes [provider]  Calcium Carbonate (CALCIUM 500 PO) Take 1,000 mg by mouth daily.   Yes [provider]  cholecalciferol (VITAMIN D) 1000 units tablet Take 1,000 Units by mouth daily.   Yes [provider]  Coenzyme Q10 (CO Q 10) 100 MG CAPS Take 100 mg by mouth daily.    Yes [provider]  famotidine (PEPCID) 40 MG tablet Take 1 tablet (40 mg total) by mouth 2 (two) times daily. Please schedule an office visit for further refills. Thank you. 03/26/20  Yes Irene Shipper, MD  gabapentin (NEURONTIN) 100 MG capsule Take 1 capsule by mouth daily. 03/10/16  Yes [provider]  Insulin Infusion Pump KIT as directed.   Yes [provider]  MAGNESIUM PO Take 1 tablet by mouth daily.    Yes [provider]  Multiple Vitamin (MULTIVITAMIN) tablet Take 1 tablet by mouth daily.   Yes [provider]  nitroGLYCERIN (NITROSTAT) 0.4 MG SL tablet PLACE 1 TABLET (0.4 MG TOTAL) UNDER THE TONGUE EVERY 5  (FIVE) MINUTES AS NEEDED FOR CHEST PAIN. 01/10/19  Yes Evans Lance, MD  NOVOLOG 100 UNIT/ML injection as directed. 03/10/16  Yes [provider]  pantoprazole (PROTONIX) 40 MG tablet Take 40 mg by mouth See admin instructions. Takes 40 mg every morning, can take a second 40 mg if needed for acid   Yes [provider]  Pitavastatin Calcium 2 MG TABS Take 2 mg by mouth at bedtime.    Yes [provider]  ramipril (ALTACE) 2.5 MG capsule Take 2.5 mg by mouth at bedtime.   Yes [provider]  sertraline (ZOLOFT) 100 MG tablet Take 100 mg by mouth daily before breakfast.   Yes [provider]  verapamil (CALAN) 40 MG tablet Take 1 tablet (40 mg total) by mouth daily as needed. 06/01/19  Yes Evans Lance, MD  verapamil (CALAN-SR) 120 MG CR tablet TAKE 1 TABLET (120 MG TOTAL) BY MOUTH AT BEDTIME. 03/30/20  Yes Evans Lance, MD  ZETIA 10 MG tablet TAKE 1 TAB (10 mg) BY MOUTH ONCE DAILY AT BEDTIME 10/12/14  Yes [provider]      Allergies  Allergen Reactions  . Pumpkin Flavor Anaphylaxis    Squash also  . Salmon [Fish Allergy] Anaphylaxis  . Crestor [Rosuvastatin Calcium] Other (See Comments)    Muscle pain  . Lipitor [Atorvastatin] Other (See Comments)    Muscle pain  . Sulfadoxine   . Sulfonamide Derivatives Other (See Comments)    REACTION: GI distress  . Morphine And Related Rash    ROS:  Out of a complete 14 system review of symptoms, the patient complains only of the following symptoms, and all other reviewed systems are negative.  Numbness in the hands  Blood pressure (!) 151/72, pulse (!) 120, height $RemoveBef'5\' 2"'uFIWuwviub$  (1.575 m), weight 172 lb 6.4 oz (78.2 kg).  Physical Exam  General: The patient is alert and cooperative at the time of the examination.  The patient is moderately obese.  Eyes: Pupils are equal, round, and reactive to light. Discs are flat bilaterally.  Neck: The neck is supple, no carotid bruits are  noted.  Respiratory: The respiratory examination is clear.  Cardiovascular: The cardiovascular examination reveals a regular rate and rhythm, no obvious murmurs or rubs are noted.  Skin: Extremities are without significant edema.  Neurologic Exam  Mental status: The patient is alert and oriented x 3 at the time of the examination. The patient has apparent normal recent and remote memory, with an apparently normal attention span and concentration ability.  Cranial nerves: Facial symmetry is present. There is good sensation of the face to pinprick and soft touch bilaterally. The strength of the  facial muscles and the muscles to head turning and shoulder shrug are normal bilaterally. Speech is well enunciated, no aphasia or dysarthria is noted. Extraocular movements are full. Visual fields are full. The tongue is midline, and the patient has symmetric elevation of the soft palate. No obvious hearing deficits are noted.  Motor: The motor testing reveals 5 over 5 strength of all 4 extremities. Good symmetric motor tone is noted throughout.  Sensory: Sensory testing is intact to pinprick, soft touch, vibration sensation, and position sense on all 4 extremities.  No stocking pattern pinprick sensory deficit is noted.  No evidence of extinction is noted.  Coordination: Cerebellar testing reveals good finger-nose-finger and heel-to-shin bilaterally.  Gait and station: Gait is normal. Tandem gait is normal. Romberg is negative. No drift is seen.  Reflexes: Deep tendon reflexes are symmetric, but are somewhat brisk bilaterally.  The ankle jerk reflexes are well-maintained bilaterally.  Toes are downgoing bilaterally.   Assessment/Plan:  1.  Type 1 diabetes  2.  Bilateral carpal tunnel syndrome  The patient does report some sensory changes around the mouth with low blood sugars, this does not occur when her sugars are normal.  Strokelike symptoms can occur with a very low or very high blood  sugars.  The patient has had nerve conduction studies done previously did not show evidence of a peripheral neuropathy, per clinical examination continues to support this.  The patient does have bilateral carpal tunnel syndrome, wrist splint prescription was given for this.  She will follow up here if needed.  Jill Alexanders MD 06/13/2020 2:44 PM  [facility] [address] [place], [place] [address]  Phone [phone] Fax [phone]

## 2020-08-29 ENCOUNTER — Other Ambulatory Visit: Payer: Self-pay | Admitting: Internal Medicine

## 2020-12-04 ENCOUNTER — Other Ambulatory Visit (HOSPITAL_COMMUNITY): Payer: Self-pay | Admitting: Sports Medicine

## 2020-12-04 ENCOUNTER — Ambulatory Visit (HOSPITAL_COMMUNITY)
Admission: RE | Admit: 2020-12-04 | Discharge: 2020-12-04 | Disposition: A | Discharge status: Home / Self Care | Payer: BC Managed Care – PPO | Source: Ambulatory Visit | Attending: Cardiovascular Disease | Admitting: Cardiovascular Disease

## 2020-12-04 ENCOUNTER — Other Ambulatory Visit: Payer: Self-pay

## 2020-12-04 DIAGNOSIS — M79605 Pain in left leg: Secondary | ICD-10-CM

## 2020-12-04 DIAGNOSIS — M7989 Other specified soft tissue disorders: Secondary | ICD-10-CM | POA: Diagnosis not present

## 2021-01-23 ENCOUNTER — Other Ambulatory Visit: Payer: Self-pay | Admitting: Internal Medicine

## 2021-03-12 ENCOUNTER — Ambulatory Visit: Payer: BC Managed Care – PPO | Admitting: Internal Medicine

## 2021-03-12 ENCOUNTER — Other Ambulatory Visit: Payer: Self-pay

## 2021-03-12 VITALS — BP 122/66 | HR 80 | Ht 62.0 in | Wt 175.0 lb

## 2021-03-12 DIAGNOSIS — R0789 Other chest pain: Secondary | ICD-10-CM

## 2021-03-12 DIAGNOSIS — R002 Palpitations: Secondary | ICD-10-CM

## 2021-03-12 DIAGNOSIS — E7800 Pure hypercholesterolemia, unspecified: Secondary | ICD-10-CM

## 2021-03-12 MED ORDER — VERAPAMIL HCL 40 MG PO TABS: 40.0000 mg | ORAL_TABLET | Freq: Every day | ORAL | 1 refills | Status: DC | PRN

## 2021-03-12 NOTE — Progress Notes (Signed)
HPI [name] returns today for ongoing evaluation and management of chest pain, dysautonomia and obesity. She has done well in the interim though she admits to being under more stress as she lost her job. Minimal chest pain. No sob. She is exercising. Her A1C was 7.6. No syncope. She has had some reflux and is pending a visit to Dr. Henrene Pastor.  Allergies  Allergen Reactions   Pumpkin Flavor Anaphylaxis    Squash also   Salmon [Fish Allergy] Anaphylaxis   Crestor [Rosuvastatin Calcium] Other (See Comments)    Muscle pain   Lipitor [Atorvastatin] Other (See Comments)    Muscle pain   Sulfadoxine    Sulfonamide Derivatives Other (See Comments)    REACTION: GI distress   Morphine And Related Rash     Current Outpatient Medications  Medication Sig Dispense Refill   Ascorbic Acid (VITAMIN C) 500 MG tablet Take 500 mg by mouth daily.     aspirin 81 MG tablet Take 81 mg by mouth at bedtime.     Calcium Carbonate (CALCIUM 500 PO) Take 1,000 mg by mouth daily.     cholecalciferol (VITAMIN D) 1000 units tablet Take 1,000 Units by mouth daily.     Coenzyme Q10 (CO Q 10) 100 MG CAPS Take 100 mg by mouth daily.      famotidine (PEPCID) 40 MG tablet TAKE 1 TABLET BY MOUTH TWICE A DAY , PLEASE SCHEDULE OFFICE VISIT FOR FURTHER REFILLS 180 tablet 1   gabapentin (NEURONTIN) 100 MG capsule Take 1 capsule by mouth daily.     Insulin Infusion Pump KIT as directed.     MAGNESIUM PO Take 1 tablet by mouth daily.      Multiple Vitamin (MULTIVITAMIN) tablet Take 1 tablet by mouth daily.     nitroGLYCERIN (NITROSTAT) 0.4 MG SL tablet PLACE 1 TABLET (0.4 MG TOTAL) UNDER THE TONGUE EVERY 5 (FIVE) MINUTES AS NEEDED FOR CHEST PAIN. 75 tablet 1   NOVOLOG 100 UNIT/ML injection as directed.     pantoprazole (PROTONIX) 40 MG tablet Take 40 mg by mouth See admin instructions. Takes 40 mg every morning, can take a second 40 mg if needed for acid     Pitavastatin Calcium 2 MG TABS Take 2 mg by mouth at bedtime.       ramipril (ALTACE) 2.5 MG capsule Take 2.5 mg by mouth at bedtime.     sertraline (ZOLOFT) 100 MG tablet Take 100 mg by mouth daily before breakfast.     verapamil (CALAN-SR) 120 MG CR tablet TAKE 1 TABLET (120 MG TOTAL) BY MOUTH AT BEDTIME. 90 tablet 3   ZETIA 10 MG tablet TAKE 1 TAB (10 mg) BY MOUTH ONCE DAILY AT BEDTIME  3   verapamil (CALAN) 40 MG tablet Take 1 tablet (40 mg total) by mouth daily as needed. 90 tablet 1   No current facility-administered medications for this visit.     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   Carpal tunnel syndrome, bilateral 06/02/2016   Chest pain    RECURRENT WITH NEGITIVE CATHERIZATIONS   Complication of anesthesia    aspirated and "gained 15pounds, ended up in ICU"   DM (diabetes mellitus) ([place])    Dyslipidemia    Fatty liver    GERD (gastroesophageal reflux disease)    History of CT scan    Coronary CTA 3/17: Calcium score 0; no plaque or  stenosis noted in coronary arteries   Hypercholesteremia    Hypertension    Migraine    Obesity    Pulmonary embolus ([place]) 2002   Tachycardia    A fib    ROS:   All systems reviewed and negative except as noted in the HPI.   Past Surgical History:  Procedure Laterality Date   CARDIAC CATHETERIZATION     x2   CARPAL TUNNEL RELEASE     right   CHOLECYSTECTOMY  03/24/2012   Procedure: LAPAROSCOPIC CHOLECYSTECTOMY WITH INTRAOPERATIVE CHOLANGIOGRAM;  Surgeon: Edward Jolly, MD;  [facility];  Service: General;  Laterality: N/A;   ESOPHAGEAL MANOMETRY N/A 05/28/2015   Procedure: ESOPHAGEAL MANOMETRY (EM);  Surgeon: Irene Shipper, MD;  [facility];  Service: Gastroenterology;  Laterality: N/A;   FIBROID TUMOR     UTERUS SURGERY   LAPAROSCOPIC HYSTERECTOMY       Family History  Problem Relation Age of Onset   Irritable bowel syndrome Father    Gallbladder disease Mother    Gallbladder disease Other        3 mat.  uncles   Cancer Maternal Grandmother        ovarian   Colon cancer Neg Hx      Social History   Socioeconomic History   Marital status: Single    Spouse name: Not on file   Number of children: 0   Years of education: Post-grad   Highest education level: Not on file  Occupational History   Occupation: registered dietician    Comment: [employer]  Tobacco Use   Smoking status: Never   Smokeless tobacco: Never  Vaping Use   Vaping Use: Never used  Substance and Sexual Activity   Alcohol use: Yes    Comment: rarely   Drug use: No   Sexual activity: Not on file  Other Topics Concern   Not on file  Social History Narrative   Lives at home alone   Right-handed   Drinks 1 cup caffeine daily   Social Determinants of Health   Financial Resource Strain: Not on file  Food Insecurity: Not on file  Transportation Needs: Not on file  Physical Activity: Not on file  Stress: Not on file  Social Connections: Not on file  Intimate Partner Violence: Not on file     BP 122/66   Pulse 80   Ht $R'5\' 2"'sD$  (1.575 m)   Wt 175 lb (79.4 kg)   SpO2 98%   BMI 32.01 kg/m   Physical Exam:  Well appearing NAD HEENT: Unremarkable Neck:  No JVD, no thyromegally Lymphatics:  No adenopathy Back:  No CVA tenderness Lungs:  Clear with no wheezes HEART:  Regular rate rhythm, no murmurs, no rubs, no clicks Abd:  soft, positive bowel sounds, no organomegally, no rebound, no guarding Ext:  2 plus pulses, no edema, no cyanosis, no clubbing Skin:  No rashes no nodules Neuro:  CN II through XII intact, motor grossly intact  EKG - nsr  Assess/Plan:  Chest pain - she is well controlled. I suspect that much of her symptoms is related to reflux as well as some spasm.  Dysautonomia - this appears to be well controlled. No change in meds. Obesity - she is exercising. Her weight has increased but she feels better exercising.  Dyslipidemia - she will continue her statin.  Carleene Overlie  Eyad Rochford,MD

## 2021-03-12 NOTE — Patient Instructions (Addendum)
Medication Instructions:  Your physician recommends that you continue on your current medications as directed. Please refer to the Current Medication list given to you today.  Labwork: None ordered.  Testing/Procedures: None ordered.  Follow-Up: Your physician wants you to follow-up in: one year with Cristopher Peru, MD. You will receive a reminder letter in the mail two months in advance. If you don't receive a letter, please call our office to schedule the follow-up appointment.   Any Other Special Instructions Will Be Listed Below (If Applicable).  If you need a refill on your cardiac medications before your next appointment, please call your pharmacy.

## 2021-03-19 ENCOUNTER — Ambulatory Visit: Payer: BC Managed Care – PPO | Admitting: Internal Medicine

## 2021-03-19 ENCOUNTER — Encounter: Payer: Self-pay | Admitting: Internal Medicine

## 2021-03-19 VITALS — BP 130/80 | HR 89 | Ht 62.0 in | Wt 173.5 lb

## 2021-03-19 DIAGNOSIS — K219 Gastro-esophageal reflux disease without esophagitis: Secondary | ICD-10-CM | POA: Diagnosis not present

## 2021-03-19 MED ORDER — FAMOTIDINE 40 MG PO TABS: ORAL_TABLET | ORAL | 3 refills | Status: DC

## 2021-03-19 NOTE — Progress Notes (Signed)
HISTORY OF PRESENT ILLNESS:  Jocelyn [name] is a 55 y.o. female with diabetes mellitus, hypertension, obesity, GERD, chronic atypical chest pain.  She presents today for follow-up regarding management of chronic GERD and requests refill of her famotidine.  I last saw the patient in the office January 2017.  She was last seen in this office by the GI physician assistant January 2020.  Patient tells me that she had been on pantoprazole 40 mg twice daily and famotidine 40 mg twice daily to manage her GERD.  She was concerned about reports that PPIs may be associated with dementia.  She has tried to decrease her pantoprazole.  Currently taking pantoprazole once daily and famotidine twice daily.  She does request a refill of her famotidine.  She denies dysphagia.  GI review of systems is essentially negative.  Her last upper endoscopy was January 2020.  No significant abnormalities.  Last colonoscopy July 2016 was normal.  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   Carpal tunnel syndrome, bilateral 06/02/2016   Chest pain    RECURRENT WITH NEGITIVE CATHERIZATIONS   Complication of anesthesia    aspirated and "gained 15pounds, ended up in ICU"   DM (diabetes mellitus) ([place])    Dyslipidemia    Fatty liver    GERD (gastroesophageal reflux disease)    History of CT scan    Coronary CTA 3/17: Calcium score 0; no plaque or stenosis noted in coronary arteries   Hypercholesteremia    Hypertension    Migraine    Obesity    Pulmonary embolus ([place]) 2002   Tachycardia    A fib    Past Surgical History:  Procedure Laterality Date   CARDIAC CATHETERIZATION     x2   CARPAL TUNNEL RELEASE     right   CHOLECYSTECTOMY  03/24/2012   Procedure: LAPAROSCOPIC CHOLECYSTECTOMY WITH  INTRAOPERATIVE CHOLANGIOGRAM;  Surgeon: Edward Jolly, MD;  Location: [facility];  Service: General;  Laterality: N/A;   ESOPHAGEAL MANOMETRY N/A 05/28/2015   Procedure: ESOPHAGEAL MANOMETRY (EM);  Surgeon: Irene Shipper, MD;  Location: [facility];  Service: Gastroenterology;  Laterality: N/A;   FIBROID TUMOR     UTERUS SURGERY   LAPAROSCOPIC HYSTERECTOMY      Social History Jocelyn [name]  reports that she has never smoked. She has never used smokeless tobacco. She reports current alcohol use. She reports that she does not use drugs.  family history includes Arrhythmia in her mother; Cancer in her maternal grandmother; Diabetes in her paternal grandfather; Gallbladder disease in her mother and another family member; Heart disease in her maternal grandfather and paternal grandfather; Irritable bowel syndrome in her father.  Allergies  Allergen Reactions   Pumpkin Flavor Anaphylaxis    Squash also   Salmon [Fish Allergy] Anaphylaxis   Crestor [Rosuvastatin Calcium] Other (See Comments)    Muscle pain   Lipitor [Atorvastatin] Other (See Comments)    Muscle pain   Sulfadoxine    Sulfonamide Derivatives Other (See Comments)    REACTION: GI distress   Morphine And Related Rash       PHYSICAL EXAMINATION: Vital signs: BP 130/80   Pulse 89   Ht 5\' 2"  (1.575 m)   Wt 173 lb 8 oz (78.7  kg)   SpO2 98%   BMI 31.73 kg/m   Constitutional: generally well-appearing, no acute distress Psychiatric: alert and oriented x3, cooperative Eyes: extraocular movements intact, anicteric, conjunctiva pink Mouth: oral pharynx moist, no lesions Neck: supple no lymphadenopathy Cardiovascular: heart regular rate and rhythm, no murmur Lungs: clear to auscultation bilaterally Abdomen: soft, nontender, nondistended, no obvious ascites, no peritoneal signs, normal bowel sounds, no organomegaly Rectal: Omitted Extremities: no clubbing, cyanosis, or lower extremity edema bilaterally Skin: no lesions on  visible extremities Neuro: No focal deficits.  Cranial nerves intact  ASSESSMENT:  1.  GERD.  Currently taking PPI in the morning and famotidine twice daily.  No active symptoms with this regimen 2.  Unremarkable EGD January 2020 3.  History of atypical chest pain 4.  Normal colonoscopy 2016   PLAN:  1.  Refill famotidine 40 mg twice daily.  Medication risks reviewed 2.  Reflux precautions 3.  Discussed risks associated with PPI 4.  Routine screening colonoscopy around 2026. 5.  Routine office follow-up 2 years.  Sooner if needed

## 2021-03-19 NOTE — Patient Instructions (Signed)
If you are age 56 or older, your body mass index should be between 23-30. Your Body mass index is 31.73 kg/m. If this is out of the aforementioned range listed, please consider follow up with your Primary Care Provider.  If you are age 89 or younger, your body mass index should be between 19-25. Your Body mass index is 31.73 kg/m. If this is out of the aformentioned range listed, please consider follow up with your Primary Care Provider.   ________________________________________________________  The [facility] providers would like to encourage you to use [facility] to communicate with providers for non-urgent requests or questions.  Due to long hold times on the telephone, sending your provider a message by [facility] may be a faster and more efficient way to get a response.  Please allow 48 business hours for a response.  Please remember that this is for non-urgent requests.  _______________________________________________________  We have sent the following medications to your pharmacy for you to pick up at your convenience:  Pepcid  Please follow up in two years

## 2021-04-01 ENCOUNTER — Other Ambulatory Visit: Payer: Self-pay | Admitting: Internal Medicine

## 2021-09-12 ENCOUNTER — Other Ambulatory Visit: Payer: Self-pay | Admitting: Internal Medicine

## 2022-03-01 ENCOUNTER — Other Ambulatory Visit: Payer: Self-pay | Admitting: Internal Medicine

## 2022-03-12 ENCOUNTER — Other Ambulatory Visit: Payer: Self-pay | Admitting: Internal Medicine

## 2022-03-14 ENCOUNTER — Ambulatory Visit: Payer: BC Managed Care – PPO | Attending: Internal Medicine | Admitting: Internal Medicine

## 2022-03-14 ENCOUNTER — Encounter: Payer: Self-pay | Admitting: Internal Medicine

## 2022-03-14 VITALS — BP 104/70 | HR 75 | Ht 62.0 in | Wt 179.4 lb

## 2022-03-14 DIAGNOSIS — R0789 Other chest pain: Secondary | ICD-10-CM | POA: Diagnosis not present

## 2022-03-14 DIAGNOSIS — I479 Paroxysmal tachycardia, unspecified: Secondary | ICD-10-CM

## 2022-03-14 NOTE — Patient Instructions (Signed)

## 2022-03-14 NOTE — Progress Notes (Signed)
HPI  [name] returns today for ongoing evaluation and management of chest pain, dysautonomia and obesity. She has done well in the interim though she admits to being under more stress as she lost her job. Minimal chest pain. No sob. She is exercising. Her A1C was 7.6. No syncope. She has fleeting chest pain, which is non-cardiac.     Current Outpatient Medications  Medication Sig Dispense Refill   Ascorbic Acid (VITAMIN C) 500 MG tablet Take 500 mg by mouth daily.     aspirin 81 MG tablet Take 81 mg by mouth at bedtime.     Calcium Carbonate (CALCIUM 500 PO) Take 1,000 mg by mouth daily.     cholecalciferol (VITAMIN D) 1000 units tablet Take 1,000 Units by mouth daily.     Coenzyme Q10 (CO Q 10) 100 MG CAPS Take 100 mg by mouth daily.      famotidine (PEPCID) 40 MG tablet TAKE 1 TABLET BY MOUTH TWICE A DAY , PLEASE SCHEDULE OFFICE VISIT FOR FURTHER REFILLS. 180 tablet 3   gabapentin (NEURONTIN) 100 MG capsule Take 1 capsule by mouth daily.     Insulin Infusion Pump KIT as directed.     MAGNESIUM PO Take 1 tablet by mouth daily.      Multiple Vitamin (MULTIVITAMIN) tablet Take 1 tablet by mouth daily.     nitroGLYCERIN (NITROSTAT) 0.4 MG SL tablet PLACE 1 TABLET (0.4 MG TOTAL) UNDER THE TONGUE EVERY 5 (FIVE) MINUTES AS NEEDED FOR CHEST PAIN. 75 tablet 1   NOVOLOG 100 UNIT/ML injection as directed.     pantoprazole (PROTONIX) 40 MG tablet Take 40 mg by mouth See admin instructions. Takes 40 mg every morning, can take a second 40 mg if needed for acid     Pitavastatin Calcium 2 MG TABS Take 2 mg by mouth at bedtime.      ramipril (ALTACE) 2.5 MG capsule Take 2.5 mg by mouth at bedtime.     sertraline (ZOLOFT) 100 MG tablet Take 100 mg by mouth daily before breakfast.     verapamil (CALAN) 40 MG tablet Take 1 tablet (40 mg total) by mouth daily as needed. 90 tablet 0   verapamil (CALAN-SR) 120 MG CR tablet TAKE 1 TABLET BY MOUTH EVERYDAY AT BEDTIME 90 tablet 0   ZETIA 10 MG tablet  TAKE 1 TAB (10 mg) BY MOUTH ONCE DAILY AT BEDTIME  3   No current facility-administered medications for this visit.     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   Past Surgical History:  Procedure Laterality Date   CARDIAC CATHETERIZATION     x2   CARPAL TUNNEL RELEASE  right   CHOLECYSTECTOMY  03/24/2012   Procedure: LAPAROSCOPIC CHOLECYSTECTOMY WITH INTRAOPERATIVE CHOLANGIOGRAM;  Surgeon: Edward Jolly, MD;  [facility];  Service: General;  Laterality: N/A;   ESOPHAGEAL MANOMETRY N/A 05/28/2015   Procedure: ESOPHAGEAL MANOMETRY (EM);  Surgeon: Irene Shipper, MD;  [facility];  Service: Gastroenterology;  Laterality: N/A;   FIBROID TUMOR     UTERUS SURGERY   LAPAROSCOPIC HYSTERECTOMY       Family History  Problem Relation Age of Onset   Gallbladder disease Mother    Arrhythmia Mother    Irritable bowel syndrome Father    Cancer Maternal Grandmother        ovarian   Heart disease Maternal Grandfather    Heart disease Paternal Grandfather    Diabetes Paternal Grandfather    Gallbladder disease Other        3 mat. uncles   Colon cancer Neg Hx    Esophageal cancer Neg Hx    Pancreatic cancer Neg Hx    Stomach cancer Neg Hx      Social History   Socioeconomic History   Marital status: Single    Spouse  name: Not on file   Number of children: 0   Years of education: Post-grad   Highest education level: Not on file  Occupational History   Occupation: registered dietician    Comment: [employer]  Tobacco Use   Smoking status: Never   Smokeless tobacco: Never  Vaping Use   Vaping Use: Never used  Substance and Sexual Activity   Alcohol use: Yes    Comment: rarely   Drug use: No   Sexual activity: Not on file  Other Topics Concern   Not on file  Social History Narrative   Lives at home alone   Right-handed   Drinks 1 cup caffeine daily   Social Determinants of Health   Financial Resource Strain: Not on file  Food Insecurity: Not on file  Transportation Needs: Not on file  Physical Activity: Not on file  Stress: Not on file  Social Connections: Not on file  Intimate Partner Violence: Not on file     BP 104/70   Pulse 75   Ht _0  (1.575 m)   Wt 179 lb 6.4 oz (81.4 kg)   SpO2 99%   BMI 32.81 kg/m   Physical Exam:  Well appearing NAD HEENT: Unremarkable Neck:  No JVD, no thyromegally Lymphatics:  No adenopathy Back:  No CVA tenderness Lungs:  Clear HEART:  Regular rate rhythm, no murmurs, no rubs, no clicks Abd:  soft, positive bowel sounds, no organomegally, no rebound, no guarding Ext:  2 plus pulses, no edema, no cyanosis, no clubbing Skin:  No rashes no nodules Neuro:  CN II through XII intact, motor grossly intact  EKG - nsr  Assess/Plan:  Chest pain - she is well controlled. I suspect that much of her symptoms is related to reflux as well as some spasm.  Dysautonomia - this appears to be well controlled. No change in meds. Obesity - she is exercising. Her weight has increased but she feels better exercising.  Dyslipidemia - she will continue her statin.   [name],MD

## 2022-06-07 ENCOUNTER — Other Ambulatory Visit: Payer: Self-pay | Admitting: Internal Medicine

## 2023-01-21 ENCOUNTER — Other Ambulatory Visit: Payer: Self-pay | Admitting: Internal Medicine

## 2023-03-02 ENCOUNTER — Telehealth: Payer: Self-pay | Admitting: Internal Medicine

## 2023-03-02 MED ORDER — FAMOTIDINE 40 MG PO TABS: ORAL_TABLET | ORAL | 1 refills | Status: DC

## 2023-03-02 NOTE — Telephone Encounter (Signed)
Inbound call from patient requesting a refill for famotidine medication. Patient has been scheduled for 05/22/23. Please advise, thank you.

## 2023-03-02 NOTE — Telephone Encounter (Signed)
Pepcid refilled

## 2023-03-24 ENCOUNTER — Encounter: Payer: Self-pay | Admitting: Internal Medicine

## 2023-03-24 ENCOUNTER — Ambulatory Visit: Payer: BC Managed Care – PPO | Attending: Internal Medicine | Admitting: Internal Medicine

## 2023-03-24 VITALS — BP 126/60 | HR 79 | Ht 62.0 in | Wt 177.2 lb

## 2023-03-24 DIAGNOSIS — G901 Familial dysautonomia [Riley-Day]: Secondary | ICD-10-CM

## 2023-03-24 NOTE — Progress Notes (Signed)
HPI Jocelyn [name] returns today for ongoing evaluation and management of chest pain, dysautonomia and obesity. She has done well in the interim though she admits to being under more stress as she lost her job. Minimal chest pain. No sob. She is exercising. Her A1C was 7.6. No syncope. She has fleeting chest pain, which is non-cardiac but appears to be at times related to cold weather.  Allergies  Allergen Reactions   Pumpkin Flavoring Agent (Non-Screening) Anaphylaxis    Squash also   Salmon [Fish Allergy] Anaphylaxis   Crestor [Rosuvastatin Calcium] Other (See Comments)    Muscle pain   Lipitor [Atorvastatin] Other (See Comments)    Muscle pain   Sulfadoxine    Sulfonamide Derivatives Other (See Comments)    REACTION: GI distress   Morphine And Codeine Rash     Current Outpatient Medications  Medication Sig Dispense Refill   Ascorbic Acid (VITAMIN C) 500 MG tablet Take 500 mg by mouth daily.     aspirin 81 MG tablet Take 81 mg by mouth at bedtime.     Calcium Carbonate (CALCIUM 500 PO) Take 1,000 mg by mouth daily.     cholecalciferol (VITAMIN D) 1000 units tablet Take 1,000 Units by mouth daily.     Coenzyme Q10 (CO Q 10) 100 MG CAPS Take 100 mg by mouth daily.      famotidine (PEPCID) 40 MG tablet TAKE 1 TABLET BY MOUTH TWICE A DAY , PLEASE SCHEDULE OFFICE VISIT FOR FURTHER REFILLS 180 tablet 1   gabapentin (NEURONTIN) 100 MG capsule Take 1 capsule by mouth daily.     Insulin Infusion Pump KIT as directed.     MAGNESIUM PO Take 1 tablet by mouth daily.      Multiple Vitamin (MULTIVITAMIN) tablet Take 1 tablet by mouth daily.     nitroGLYCERIN (NITROSTAT) 0.4 MG SL tablet PLACE 1 TABLET (0.4 MG TOTAL) UNDER THE TONGUE EVERY 5 (FIVE) MINUTES AS NEEDED FOR CHEST PAIN. 75 tablet 1   NOVOLOG 100 UNIT/ML injection as directed.     pantoprazole (PROTONIX) 40 MG tablet Take 40 mg by mouth See admin instructions. Takes 40 mg every morning, can take a second 40 mg if needed for acid      Pitavastatin Calcium 2 MG TABS Take 2 mg by mouth at bedtime.      ramipril (ALTACE) 2.5 MG capsule Take 2.5 mg by mouth at bedtime.     sertraline (ZOLOFT) 100 MG tablet Take 100 mg by mouth daily before breakfast.     verapamil (CALAN) 40 MG tablet Take 1 tablet (40 mg total) by mouth daily as needed. 90 tablet 0   verapamil (CALAN-SR) 120 MG CR tablet TAKE 1 TABLET BY MOUTH EVERYDAY AT BEDTIME 90 tablet 2   ZETIA 10 MG tablet TAKE 1 TAB (10 mg) BY MOUTH ONCE DAILY AT BEDTIME  3   No current facility-administered medications for this visit.     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   Past Surgical History:  Procedure Laterality Date   CARDIAC CATHETERIZATION     x2   CARPAL TUNNEL RELEASE     right   CHOLECYSTECTOMY  03/24/2012   Procedure: LAPAROSCOPIC CHOLECYSTECTOMY WITH INTRAOPERATIVE CHOLANGIOGRAM;  Surgeon: Mariella Saa, MD;  [facility];  Service: General;  Laterality: N/A;   ESOPHAGEAL MANOMETRY N/A 05/28/2015   Procedure: ESOPHAGEAL MANOMETRY (EM);  Surgeon: Hilarie Fredrickson, MD;  [facility];  Service: Gastroenterology;  Laterality: N/A;   FIBROID TUMOR     UTERUS SURGERY   LAPAROSCOPIC HYSTERECTOMY       Family History  Problem Relation Age of Onset   Gallbladder disease Mother    Arrhythmia Mother     Irritable bowel syndrome Father    Cancer Maternal Grandmother        ovarian   Heart disease Maternal Grandfather    Heart disease Paternal Grandfather    Diabetes Paternal Grandfather    Gallbladder disease Other        3 mat. uncles   Colon cancer Neg Hx    Esophageal cancer Neg Hx    Pancreatic cancer Neg Hx    Stomach cancer Neg Hx      Social History   Socioeconomic History   Marital status: Single    Spouse name: Not on file   Number of children: 0   Years of education: Post-grad   Highest education level: Not on file  Occupational History   Occupation: registered dietician    Comment: [employer]  Tobacco Use   Smoking status: Never   Smokeless tobacco: Never  Vaping Use   Vaping status: Never Used  Substance and Sexual Activity   Alcohol use: Yes    Comment: rarely   Drug use: No   Sexual activity: Not on file  Other Topics Concern   Not on file  Social History Narrative   Lives at home alone   Right-handed   Drinks 1 cup caffeine daily   Social Determinants of Health   Financial Resource Strain: Not on file  Food Insecurity: Not on file  Transportation Needs: Not on file  Physical Activity: Not on file  Stress: Not on file  Social Connections: Unknown (08/27/2021)   Received from [facility], [facility]   Social Network    Social Network: Not on file  Intimate Partner Violence: Unknown (07/19/2021)   Received from [employer], [facility]   HITS    Physically Hurt: Not on file    Insult or Talk Down To: Not on file    Threaten Physical Harm: Not on file    Scream or Curse: Not on file     BP 126/60   Pulse 79   Ht 5\' 2"  (1.575 m)   Wt 177 lb 3.2 oz (80.4 kg)   SpO2 96%   BMI 32.41 kg/m   Physical Exam:  Well appearing NAD HEENT: Unremarkable Neck:  No JVD, no thyromegally Lymphatics:  No adenopathy Back:  No CVA tenderness Lungs:  Clear HEART:  Regular rate rhythm, no murmurs, no rubs, no clicks Abd:  soft,  positive bowel sounds, no  organomegally, no rebound, no guarding Ext:  2 plus pulses, no edema, no cyanosis, no clubbing Skin:  No rashes no nodules Neuro:  CN II through XII intact, motor grossly intact  EKG - nsr   Assess/Plan:  Chest pain - she is mostly controlled. I suspect that much of her symptoms is related to reflux as well as some spasm.  Dysautonomia - this appears to be well controlled. No change in meds. Obesity - she is exercising. Her weight is down 2 lbs. she feels better exercising.  Dyslipidemia - she will continue her statin.   Sharlot Gowda Lolah Coghlan,MD

## 2023-03-24 NOTE — Patient Instructions (Addendum)
Medication Instructions:  Your physician recommends that you continue on your current medications as directed. Please refer to the Current Medication list given to you today.  *If you need a refill on your cardiac medications before your next appointment, please call your pharmacy*  Lab Work: None ordered.  If you have labs (blood work) drawn today and your tests are completely normal, you will receive your results only by: MyChart Message (if you have MyChart) OR A paper copy in the mail If you have any lab test that is abnormal or we need to change your treatment, we will call you to review the results.  Testing/Procedures: None ordered.  Follow-Up: At [facility], you and your health needs are our priority.  As part of our continuing mission to provide you with exceptional heart care, we have created designated Provider Care Teams.  These Care Teams include your primary Cardiologist (physician) and Advanced Practice Providers (APPs -  Physician Assistants and Nurse Practitioners) who all work together to provide you with the care you need, when you need it.   Your next appointment:   9 months  The format for your next appointment:   In Person  Provider:   Epifanio Lesches, MD  Important Information About Sugar

## 2023-05-22 ENCOUNTER — Other Ambulatory Visit (INDEPENDENT_AMBULATORY_CARE_PROVIDER_SITE_OTHER): Payer: Self-pay

## 2023-05-22 ENCOUNTER — Ambulatory Visit (INDEPENDENT_AMBULATORY_CARE_PROVIDER_SITE_OTHER): Payer: Self-pay | Admitting: Nurse Practitioner

## 2023-05-22 ENCOUNTER — Encounter: Payer: Self-pay | Admitting: Nurse Practitioner

## 2023-05-22 VITALS — BP 118/70 | HR 87 | Ht 62.0 in | Wt 177.0 lb

## 2023-05-22 DIAGNOSIS — K219 Gastro-esophageal reflux disease without esophagitis: Secondary | ICD-10-CM

## 2023-05-22 LAB — VITAMIN D 25 HYDROXY (VIT D DEFICIENCY, FRACTURES): VITD: >120 ng/mL

## 2023-05-22 MED ORDER — PANTOPRAZOLE SODIUM 40 MG PO TBEC: 40.0000 mg | DELAYED_RELEASE_TABLET | Freq: Two times a day (BID) | ORAL | 3 refills | Status: AC

## 2023-05-22 NOTE — Patient Instructions (Addendum)
 Your provider has requested that you go to the basement level for lab work before leaving today. Press B on the elevator. The lab is located at the first door on the left as you exit the elevator.  Colonoscopy due 10/2024  Take Pantoprazole  40 mg- take 1 capsule by mouth once daily, may increase to twice daily if needed   Follow up in 1 year or as needed.  Due to recent changes in healthcare laws, you may see the results of your imaging and laboratory studies on MyChart before your provider has had a chance to review them.  We understand that in some cases there may be results that are confusing or concerning to you. Not all laboratory results come back in the same time frame and the provider may be waiting for multiple results in order to interpret others.  Please give us  48 hours in order for your provider to thoroughly review all the results before contacting the office for clarification of your results.   Thank you for trusting me with your gastrointestinal care!   Elida Shawl, CRNP

## 2023-05-22 NOTE — Progress Notes (Signed)
 Noted.

## 2023-05-22 NOTE — Progress Notes (Signed)
 05/22/2023 Jocelyn [name] [id number]  Chief Complaint: GERD follow up, Pantoprazole  refill   History of Present Illness: Jocelyn [name] is a 58 year old female with a past medical history of anxiety, hypertension, hypercholesterolemia, atypical chest pain, dysautonomia, obesity, DM type I on an insulin  pump, fatty liver and GERD. She is known by Dr. Abran. She is taking Pantoprazole  40mg  once daily, sometimes takes it bid x 1 week when acid reflux active. No specific food triggers. She also takes Famotidine  bid. No dysphagia. No upper or lower abdominal pain. She is passing a normal brown stool daily. No rectal bleeding or black stools.  EGD 04/2018 showed evidence of GERD and a diminutive gastric fundic gland polyp without evidence of H. pylori..  Colonoscopy 10/2014 was normal.  Recall colonoscopy due July 2026.  No known family history of colorectal cancer.  On ASA 81 mg daily.  Labs 12/30/2022: Glu 111. BUN 12. Cr 0.9. Na 139. K+ 4.5. T. Bili 0.4. Alk phos 59. AST 17. ALT 16. WBC 5.17. Hg 12.8. MCV 93.8. HCT 39.6. PLT 272.   PAST GI PROCEDURES:   EGD 05/07/2018: 1. GERD  2. Chronic recurrent chest pain without GI cause found  3. Diminutive gastric polyp status post biopsy. BENIGN FUNDIC GLAND POLYP. - WARTHIN-STARRY STAIN NEGATIVE FOR HELICOBACTER PYLORI. - NO INTESTINAL METAPLASIA, ADENOMATOUS CHANGE OR MALIGNANCY  Esophageal manometry 05/28/2015: Normal high-resolution esophageal manometry with intact peristalsis and normal LES relaxation Normal esophageal impedance  EGD 11/06/2014: 1. Normal EGD  2. 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   Carpal tunnel syndrome, bilateral 06/02/2016   Chest pain    RECURRENT WITH NEGITIVE CATHERIZATIONS   Complication of anesthesia     aspirated and gained 15pounds, ended up in ICU   DM (diabetes mellitus) (HCC)    Dyslipidemia    Fatty liver    GERD (gastroesophageal reflux disease)    History of CT scan    Coronary CTA 3/17: Calcium  score 0; no plaque or stenosis noted in coronary arteries   Hypercholesteremia    Hypertension    Migraine    Obesity    Pulmonary embolus (HCC) 2002   Tachycardia    A fib   Past Surgical History:  Procedure Laterality Date   CARDIAC CATHETERIZATION     x2   CARPAL TUNNEL RELEASE     right   CHOLECYSTECTOMY  03/24/2012   Procedure: LAPAROSCOPIC CHOLECYSTECTOMY WITH INTRAOPERATIVE CHOLANGIOGRAM;  Surgeon: Jocelyn ONEIDA Olives, MD;  Location: [facility];  Service: General;  Laterality: N/A;   ESOPHAGEAL MANOMETRY N/A 05/28/2015   Procedure: ESOPHAGEAL MANOMETRY (EM);  Surgeon: Norleen LOISE Abran, MD;  Location: [facility];  Service: Gastroenterology;  Laterality: N/A;   FIBROID TUMOR     UTERUS SURGERY   LAPAROSCOPIC HYSTERECTOMY       Current Outpatient Medications on File Prior to Visit  Medication Sig Dispense Refill   Ascorbic Acid (VITAMIN C) 500 MG tablet Take 500 mg by mouth daily.     aspirin  81 MG tablet Take 81 mg by mouth at bedtime.     Calcium  Carbonate (CALCIUM  500 PO) Take 1,000 mg by mouth daily.     cholecalciferol (VITAMIN D ) 1000 units tablet Take 1,000 Units by mouth daily.  Coenzyme Q10 (CO Q 10) 100 MG CAPS Take 100 mg by mouth daily.      famotidine  (PEPCID ) 40 MG tablet TAKE 1 TABLET BY MOUTH TWICE A DAY , PLEASE SCHEDULE OFFICE VISIT FOR FURTHER REFILLS 180 tablet 1   gabapentin (NEURONTIN) 100 MG capsule Take 1 capsule by mouth daily.     Insulin  Infusion Pump KIT as directed.     MAGNESIUM PO Take 1 tablet by mouth daily.      Multiple Vitamin (MULTIVITAMIN) tablet Take 1 tablet by mouth daily.     nitroGLYCERIN  (NITROSTAT ) 0.4 MG SL tablet PLACE 1 TABLET (0.4 MG TOTAL) UNDER THE TONGUE EVERY 5 (FIVE) MINUTES AS NEEDED FOR CHEST PAIN. 75 tablet 1    NOVOLOG 100 UNIT/ML injection as directed.     Pitavastatin Calcium  2 MG TABS Take 2 mg by mouth at bedtime.      ramipril  (ALTACE ) 2.5 MG capsule Take 2.5 mg by mouth at bedtime.     sertraline  (ZOLOFT ) 100 MG tablet Take 100 mg by mouth daily before breakfast. Pt also take 25 mg of Zoloft  at night     verapamil  (CALAN ) 40 MG tablet Take 1 tablet (40 mg total) by mouth daily as needed. 90 tablet 0   verapamil  (CALAN -SR) 120 MG CR tablet TAKE 1 TABLET BY MOUTH EVERYDAY AT BEDTIME 90 tablet 2   ZETIA  10 MG tablet TAKE 1 TAB (10 mg) BY MOUTH ONCE DAILY AT BEDTIME  3   No current facility-administered medications on file prior to visit.   Allergies  Allergen Reactions   Pumpkin Flavoring Agent (Non-Screening) Anaphylaxis    Squash also   Salmon [Fish Allergy] Anaphylaxis   Crestor [Rosuvastatin Calcium ] Other (See Comments)    Muscle pain   Lipitor [Atorvastatin ] Other (See Comments)    Muscle pain   Sulfadoxine    Sulfonamide Derivatives Other (See Comments)    REACTION: GI distress   Morphine  And Codeine Rash   Current Medications, Allergies, Past Medical History, Past Surgical History, Family History and Social History were reviewed in [employer] record.  Review of Systems:   Constitutional: Negative for fever, sweats, chills or weight loss.  Respiratory: Negative for shortness of breath.   Cardiovascular: Negative for chest pain, palpitations. Sometimes leg swelling in summer.   Gastrointestinal: See HPI.  Musculoskeletal: Negative for back pain or muscle aches.  Neurological: Negative for dizziness, headaches or paresthesias.   Physical Exam: BP 118/70   Pulse 87   Ht 5' 2 (1.575 m)   Wt 177 lb (80.3 kg)   BMI 32.37 kg/m   General: 58 year old female in no acute distress. Head: Normocephalic and atraumatic. Eyes: No scleral icterus. Conjunctiva pink . Ears: Normal auditory acuity. Mouth: Dentition intact. No ulcers or lesions.  Lungs: Clear  throughout to auscultation. Heart: Regular rate and rhythm, no murmur. Abdomen: Soft, nondistended. Mild epigastric tenderness without rebound or guarding. No masses or hepatomegaly. Normal bowel sounds x 4 quadrants.  Rectal: Deferred.  Musculoskeletal: Symmetrical with no gross deformities. Extremities: No edema. Neurological: Alert oriented x 4. No focal deficits.  Psychological: Alert and cooperative. Normal mood and affect  Assessment and Recommendations:  GERD.  EGD 05/03/2018 showed evidence of GERD and a fundic gland gastric polyp. -Continue GERD diet -Pantoprazole  40 mg once daily to be taken 30 minutes before breakfast. May increase to twice daily as needed. -Continue famotidine  40 mg p.o. twice daily -Check a vitamin D  level as patient is at risk for  vitamin D  deficiency on chronic PPI therapy -Follow-up as needed and in 1 year  Colon cancer screening.  Normal colonoscopy 10/2014.  No known family history of colorectal cancer. -Next colonoscopy due 10/2024  Diabetes Mellitus type 1 on an  insulin  pump

## 2023-09-08 ENCOUNTER — Other Ambulatory Visit: Payer: Self-pay | Admitting: Internal Medicine

## 2023-11-16 ENCOUNTER — Ambulatory Visit: Admitting: Neurology

## 2023-11-16 ENCOUNTER — Encounter: Payer: Self-pay | Admitting: Neurology

## 2023-11-16 VITALS — BP 114/70 | HR 96 | Ht 62.0 in | Wt 179.8 lb

## 2023-11-16 DIAGNOSIS — Z86711 Personal history of pulmonary embolism: Secondary | ICD-10-CM

## 2023-11-16 DIAGNOSIS — R0681 Apnea, not elsewhere classified: Secondary | ICD-10-CM | POA: Diagnosis not present

## 2023-11-16 DIAGNOSIS — R0683 Snoring: Secondary | ICD-10-CM | POA: Diagnosis not present

## 2023-11-16 DIAGNOSIS — Z9641 Presence of insulin pump (external) (internal): Secondary | ICD-10-CM

## 2023-11-16 NOTE — Progress Notes (Signed)
 Chief Complaint  Patient presents with   New Patient (Initial Visit)    RM 1, Pt alone. Pt referred here by PCP for sleep apnea. Pt saw Dr. Chalice by video once 2020 - and had a sleep study which showed no need for CPAP. Recently , Pt was  told she snores loudly and stopped breathing by a friend on travel. She also remarks on some memory impairment. Has an insulin  pump, DM type 1. Father had AD.         S[facility]INE CL[facility]IC    Provider:  Dedra Chalice, MD  Primary Care Physician:  Vernadine Charlie ORN, MD [address] [place] [address]     Referring Provider: Cleotilde Dorothyann SAILOR, Pa-c [address] [place],  [place] [address]          Chief Complaint according to patient   Patient presents with:     New Patient (Initial Visit)           HISTORY OF PRESENT ILLNESS:  Jocelyn [name] is a 58 y.o. female patient who is seen upon referral on 11/16/2023 from Dr Tisovec and PA Cleotilde  for a new sleep consultation. .     I have the pleasure of seeing Jocelyn [name] 11/16/23 a right -handed female Patient with DM type 1, a practicing dietitian, with history of mildest OSA, she had a sleep study at the [facility] in 2010 or earlier. Had mild OSA, no need for intervention.  Some in the HST here 2020.  In the meantime she has experienced again some  weight gain, more snoring, and a friend who shared a room with her recently made her aware of loud snoring and apnea. She exercises > 10K  steps each day and is still unable to lose weight.  She cannot take GLP1. She had GERD in the past, now controlled by medication,  PE while on BCP.       Sleep relevant medical history: Nocturia/ none, No surgery: No ENT,  cervical spine surgery/    Family medical /sleep history: Father has OSA, AD, mother  is healthy but has a pacemaker . Sisters are healthy  Social history:  Patient is working as Data processing manager , and lives in a household with alone. Family status is single .  The  patient currently works daytime.  Pets are not present. Tobacco use; none.  ETOH use: rare ,  Caffeine intake in form of Coffee( /)-  Soda( /) Tea ( 2 / week ) or energy drinks, chocolate at 3.30 pm.  Exercise in form of piliates and walking, she is very routined and diligent .   Hobbies : research       Sleep habits are as follows: The patient's dinner time is between  7-7.30 PM. The patient goes to bed at midnight   and  raises at 6.30- sh sleeps deeply and through the night.  She continues to sleep for 6.5 hours on back and either side, with the support of 1.5 pillows.  Dreams are reportedly rare.No parasomnia history, but a sleep talker.    The patient wakes up with an alarm-  6.30  AM is the usual rise time. She reports feeling refreshed or restored in AM, with symptoms such as dry mouth. (Wears a retainer )  Naps are taken infrequently, lasting from 20 to 30 minutes - and are refreshing .    Review of Systems: Out of a complete 14 system review, the patient complains of  only the following symptoms, and all other reviewed systems are negative.:   snoring,    How likely are you to doze in the following situations: 0 = not likely, 1 = slight chance, 2 = moderate chance, 3 = high chance   Sitting and Reading? Watching Television? Sitting inactive in a public place (theater or meeting)? As a passenger in a car for an hour without a break? Lying down in the afternoon when circumstances permit? Sitting and talking to someone? Sitting quietly after lunch without alcohol? In a car, while stopped for a few minutes in traffic?   Total = 4/ 24 points   FSS endorsed at 18/ 63 points.  GDS : 0/15  Social History   Socioeconomic History   Marital status: Single    Spouse name: Not on file   Number of children: 0   Years of education: Post-grad   Highest education level: Not on file  Occupational History   Occupation: registered dietician    Comment: [employer]  Tobacco  Use   Smoking status: Never   Smokeless tobacco: Never  Vaping Use   Vaping status: Never Used  Substance and Sexual Activity   Alcohol use: Yes    Comment: social drinker   Drug use: No   Sexual activity: Not on file  Other Topics Concern   Not on file  Social History Narrative   Lives at home alone   Right-handed   Drinks 1 cup caffeine daily   Social Drivers of Corporate investment banker Strain: Not on file  Food Insecurity: Not on file  Transportation Needs: Not on file  Physical Activity: Not on file  Stress: Not on file  Social Connections: Unknown (08/27/2021)   Received from [facility]   Social Network    Social Network: Not on file    Family History  Problem Relation Age of Onset   Gallbladder disease Mother    Arrhythmia Mother    Irritable bowel syndrome Father    Cancer Maternal Grandmother        ovarian   Heart disease Maternal Grandfather    Heart disease Paternal Grandfather    Diabetes Paternal Grandfather    Gallbladder disease Other        3 mat. 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   Carpal tunnel syndrome, bilateral 06/02/2016   Chest pain    RECURRENT WITH NEGITIVE CATHERIZATIONS   Complication of anesthesia    aspirated and gained 15pounds, ended up in ICU   DM (diabetes mellitus) (HCC)    Dyslipidemia    Fatty liver    GERD (gastroesophageal reflux disease)    History of CT scan    Coronary CTA 3/17: Calcium  score 0; no plaque or stenosis noted in coronary arteries   Hypercholesteremia    Hypertension    Migraine    Obesity    Pulmonary embolus (HCC) 2002   Tachycardia    A fib    Past Surgical History:  Procedure Laterality Date   CARDIAC CATHETERIZATION     x2   CARPAL TUNNEL RELEASE     right   CHOLECYSTECTOMY  03/24/2012   Procedure:  LAPAROSCOPIC CHOLECYSTECTOMY WITH INTRAOPERATIVE CHOLANGIOGRAM;  Surgeon:  Morene ONEIDA Olives, MD;  Location: [facility];  Service: General;  Laterality: N/A;   ESOPHAGEAL MANOMETRY N/A 05/28/2015   Procedure: ESOPHAGEAL MANOMETRY (EM);  Surgeon: Norleen LOISE Kiang, MD;  Location: [facility];  Service: Gastroenterology;  Laterality: N/A;   FIBROID TUMOR     UTERUS SURGERY   LAPAROSCOPIC HYSTERECTOMY  2012     Current Outpatient Medications on File Prior to Visit  Medication Sig Dispense Refill   Ascorbic Acid (VITAMIN C) 500 MG tablet Take 500 mg by mouth daily.     aspirin  81 MG tablet Take 81 mg by mouth at bedtime.     Calcium  Carbonate (CALCIUM  500 PO) Take 1,000 mg by mouth daily.     cholecalciferol (VITAMIN D ) 1000 units tablet Take 1,000 Units by mouth daily.     Coenzyme Q10 (CO Q 10) 100 MG CAPS Take 100 mg by mouth daily.      famotidine  (PEPCID ) 40 MG tablet TAKE 1 TABLET BY MOUTH TWICE A DAY 180 tablet 1   gabapentin (NEURONTIN) 100 MG capsule Take 1 capsule by mouth daily.     Insulin  Infusion Pump KIT as directed.     MAGNESIUM PO Take 1 tablet by mouth daily.      Multiple Vitamin (MULTIVITAMIN) tablet Take 1 tablet by mouth daily.     nitroGLYCERIN  (NITROSTAT ) 0.4 MG SL tablet PLACE 1 TABLET (0.4 MG TOTAL) UNDER THE TONGUE EVERY 5 (FIVE) MINUTES AS NEEDED FOR CHEST PAIN. 75 tablet 1   NOVOLOG 100 UNIT/ML injection as directed.     pantoprazole  (PROTONIX ) 40 MG tablet Take 1 tablet (40 mg total) by mouth 2 (two) times daily. Take 30 minutes before breakfast and dinner. 180 tablet 3   Pitavastatin Calcium  2 MG TABS Take 2 mg by mouth at bedtime.      ramipril  (ALTACE ) 2.5 MG capsule Take 2.5 mg by mouth at bedtime.     sertraline  (ZOLOFT ) 100 MG tablet Take 100 mg by mouth daily before breakfast. Pt also take 25 mg of Zoloft  at night     verapamil  (CALAN ) 40 MG tablet Take 1 tablet (40 mg total) by mouth daily as needed. 90 tablet 0   verapamil  (CALAN -SR) 120 MG CR tablet TAKE 1  TABLET BY MOUTH EVERYDAY AT BEDTIME 90 tablet 2   ZETIA  10 MG tablet TAKE 1 TAB (10 mg) BY MOUTH ONCE DAILY AT BEDTIME  3   No current facility-administered medications on file prior to visit.    Allergies  Allergen Reactions   Pumpkin Flavoring Agent (Non-Screening) Anaphylaxis    Squash also   Salmon [Fish Allergy] Anaphylaxis   Crestor [Rosuvastatin Calcium ] Other (See Comments)    Muscle pain   Lipitor [Atorvastatin ] Other (See Comments)    Muscle pain   Sulfadoxine    Sulfonamide Derivatives Other (See Comments)    REACTION: GI distress   Morphine  And Codeine Rash     DIAGNOSTIC DATA (LABS, IMAGING, TESTING) - I reviewed patient records, labs, notes, testing and imaging myself where available.  Lab Results  Component Value Date   WBC 5.7 05/21/2017   HGB 12.0 05/21/2017   HCT 36.2 05/21/2017   MCV 89.6 05/21/2017   PLT 312 05/21/2017      Component Value Date/Time   NA 138 05/21/2017 2023   K 4.0 05/21/2017 2023   CL 103 05/21/2017 2023   CO2 25 05/21/2017 2023   GLUCOSE 200 (H) 05/21/2017 2023   BUN 16 05/21/2017 2023   CREATININE  0.88 05/21/2017 2023   CALCIUM  9.0 05/21/2017 2023   PROT 6.5 12/21/2015 1450   ALBUMIN 3.7 12/21/2015 1450   AST 14 (L) 12/21/2015 1450   ALT 13 (L) 12/21/2015 1450   ALKPHOS 41 12/21/2015 1450   BILITOT 0.4 12/21/2015 1450   GFRNONAA >60 05/21/2017 2023   GFRAA >60 05/21/2017 2023   Lab Results  Component Value Date   CHOL 107 04/05/2015   HDL 54 04/05/2015   LDLCALC 33 04/05/2015   TRIG 101 04/05/2015   CHOLHDL 2.0 04/05/2015   No results found for: HGBA1C Lab Results  Component Value Date   VITAMINB12 >2000 (H) 06/02/2016   Lab Results  Component Value Date   TSH 7.070 (H) 04/05/2015    PHYSICAL EXAM:  Today's Vitals   11/16/23 1122  BP: 114/70  Pulse: 96  Weight: 179 lb 12.8 oz (81.6 kg)  Height: 5' 2 (1.575 m)   Body mass index is 32.89 kg/m.   Wt Readings from Last 3 Encounters:  11/16/23 179  lb 12.8 oz (81.6 kg)  05/22/23 177 lb (80.3 kg)  03/24/23 177 lb 3.2 oz (80.4 kg)     Ht Readings from Last 3 Encounters:  11/16/23 5' 2 (1.575 m)  05/22/23 5' 2 (1.575 m)  03/24/23 5' 2 (1.575 m)      General: The patient is awake, alert and appears not in acute distress. The patient is well groomed. Head: Normocephalic, atraumatic. Neck is supple. Mallampati 3, small mouth ,  neck circumference:15.75 inches .  Nasal airflow fully patent.   Dental status: crowded  Cardiovascular:  Regular rate and cardiac rhythm by pulse,  without distended neck veins. Respiratory: Lungs are clear to auscultation.  Skin:  Without evidence of ankle edema, or rash. Trunk: The patient's posture is erect.   NEUROLOGIC EXAM: The patient is awake and alert, oriented to place and time.   Memory subjective described as intact.  Attention span & concentration ability appears normal.  Speech is fluent,   without  dysarthria, dysphonia or aphasia.  Mood and affect are appropriate.   Cranial nerves: no loss of smell or taste reported  Pupils are equal and briskly reactive to light. Funduscopic exam deferred.  Extraocular movements in vertical and horizontal planes were intact and without nystagmus.  No Diplopia. Visual fields by finger perimetry are intact. Hearing was intact to soft voice and finger rubbing.    Facial sensation intact to fine touch.  Facial motor strength is symmetric and tongue and uvula move midline.  Neck ROM : rotation, tilt and flexion extension were normal for age and shoulder shrug was symmetrical.    Motor exam:  Symmetric bulk, tone and ROM.   Normal tone without cog- wheeling, symmetric but weak grip strength . History of carpal tunnel    Sensory:  Vibration was felt in both big toes  Proprioception tested in the upper extremities was normal.   Coordination: Rapid alternating movements in the fingers/hands were of normal speed.  The Finger-to-nose maneuver was intact  without evidence of ataxia, dysmetria or tremor.   Gait and station: Patient could rise unassisted from a seated position, walked without assistive device.  Stance is of normal width/ base and the patient turned with 3 steps.  Toe and heel walk were deferred.  Deep tendon reflexes: in the  upper and lower extremities are symmetric and brisk-  Babinski response was deferred.     ASSESSMENT AND PLAN 58 y.o. year old female  here with:  1)   Mrs. Balbi has recently traveled to [place] in the company of a good friend and this friend has told her that she snores loudly and kept her awake at night.  In addition she witnessed apneic events.  [name] had been twice before checked for sleep apnea once around the year 2010 and once with us  here in 2020 each time the AHI was just borderline with an AHI of 5/h we usually do not initiate CPAP therapy.  But but there has been a weight gain of another 20 pounds or so since last being seen.   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    She reports that she does some minor errors when she does word finding searches or crossword puzzles, this is new. Her father died at age 42 last February 06, 2024 in a memory unit, and she is worried.    She is hyperreflexic without back problems and  this is unusual for a DM type 1.  She is on ZOLOFT .    Plan:  HST repeat with watch pat.  RV in any case , if HST is positive for sleep or not , in 4 months with memory testing by [facility] if there is a decreased score noted.  Discussed the genetic risk and biomarker  evidence.    I plan to follow up either personally or through our NP within 6 months.   I would like to thank Tisovec, Charlie ORN, MD and Cleotilde Dorothyann SAILOR, Pa-c [address] [place],  [place] [address] for allowing me to meet with and to take care of this pleasant patient.   Discussion of sleep hygiene setting bedtime and rise time,  hot shower  before bed time, no screen light  in the bedroom, the bedroom should be cool, quiet and dark. Night lights should illuminate the floor not shine into your eyes. Golden glow  light is less intrusive than blue or cold light.  Read in a book with pages, not on a device. Consider audio books and soothing  sound -scapes.    After spending a total time of  45  minutes face to face and additional time for physical and neurologic examination, review of laboratory studies,  personal review of imaging studies, reports and results of other testing and review of referral information / records as far as provided in visit,   Electronically signed by: Dedra Gores, MD 11/16/2023 11:29 AM  [facility] Board certified by The [employer] of Sleep Medicine and Diplomate of the [employer] of Sleep Medicine. Board certified In Neurology through the ABPN, Fellow of the [employer] of Neurology.

## 2023-11-16 NOTE — Patient Instructions (Signed)
 Quality Sleep Information, Adult Quality sleep is important for your mental and physical health. It also improves your quality of life. Quality sleep means you: Are asleep for most of the time you are in bed. Fall asleep within 30 minutes. Wake up no more than once a night. Are awake for no longer than 20 minutes if you do wake up during the night. Most adults need 7-8 hours of quality sleep each night. How can poor sleep affect me? If you do not get enough quality sleep, you may have: Mood swings. Daytime sleepiness. Decreased alertness, reaction time, and concentration. Sleep disorders, such as insomnia and sleep apnea. Difficulty with: Solving problems. Coping with stress. Paying attention. These issues may affect your performance and productivity at work, school, and home. Lack of sleep may also put you at higher risk for accidents, suicide, and risky behaviors. If you do not get quality sleep, you may also be at higher risk for several health problems, including: Infections. Type 2 diabetes. Heart disease. High blood pressure. Obesity. Worsening of long-term conditions, like arthritis, kidney disease, depression, Parkinson's disease, and epilepsy. What actions can I take to get more quality sleep? Sleep schedule and routine Stick to a sleep schedule. Go to sleep and wake up at about the same time each day. Do not try to sleep less on weekdays and make up for lost sleep on weekends. This does not work. Limit naps during the day to 30 minutes or less. Do not take naps in the late afternoon. Make time to relax before bed. Reading, listening to music, or taking a hot bath promotes quality sleep. Make your bedroom a place that promotes quality sleep. Keep your bedroom dark, quiet, and at a comfortable room temperature. Make sure your bed is comfortable. Avoid using electronic devices that give off bright blue light for 30 minutes before bedtime. Your brain perceives bright blue  light as sunlight. This includes television, phones, and computers. If you are lying awake in bed for longer than 20 minutes, get up and do a relaxing activity until you feel sleepy. Lifestyle     Try to get at least 30 minutes of exercise on most days. Do not exercise 2-3 hours before going to bed. Do not use any products that contain nicotine or tobacco. These products include cigarettes, chewing tobacco, and vaping devices, such as e-cigarettes. If you need help quitting, ask your health care provider. Do not drink caffeinated beverages for at least 8 hours before going to bed. Coffee, tea, and some sodas contain caffeine. Do not drink alcohol or eat large meals close to bedtime. Try to get at least 30 minutes of sunlight every day. Morning sunlight is best. Medical concerns Work with your health care provider to treat medical conditions that may affect sleeping, such as: Nasal obstruction. Snoring. Sleep apnea and other sleep disorders. Talk to your health care provider if you think any of your prescription medicines may cause you to have difficulty falling or staying asleep. If you have sleep problems, talk with a sleep consultant. If you think you have a sleep disorder, talk with your health care provider about getting evaluated by a specialist. Where to find more information Sleep Foundation: sleepfoundation.org American Academy of Sleep Medicine: aasm.org Centers for Disease Control and Prevention (CDC): TonerPromos.no Contact a health care provider if: You have trouble getting to sleep or staying asleep. You often wake up very early in the morning and cannot get back to sleep. You have daytime  sleepiness. You have daytime sleep attacks of suddenly falling asleep and sudden muscle weakness (narcolepsy). You have a tingling sensation in your legs with a strong urge to move your legs (restless legs syndrome). You stop breathing briefly during sleep (sleep apnea). You think you have a  sleep disorder or are taking a medicine that is affecting your quality of sleep. Summary Most adults need 7-8 hours of quality sleep each night. Getting enough quality sleep is important for your mental and physical health. Make your bedroom a place that promotes quality sleep, and avoid things that may cause you to have poor sleep, such as alcohol, caffeine, smoking, or large meals. Talk to your health care provider if you have trouble falling asleep or staying asleep. This information is not intended to replace advice given to you by your health care provider. Make sure you discuss any questions you have with your health care provider. Document Revised: 07/24/2021 Document Reviewed: 07/24/2021 Elsevier Patient Education  2024 Elsevier Inc.Management of Memory Problems  There are some general things you can do to help manage your memory problems.  Your memory may not in fact recover, but by using techniques and strategies you will be able to manage your memory difficulties better.  1)  Establish a routine. Try to establish and then stick to a regular routine.  By doing this, you will get used to what to expect and you will reduce the need to rely on your memory.  Also, try to do things at the same time of day, such as taking your medication or checking your calendar first thing in the morning. Think about think that you can do as a part of a regular routine and make a list.  Then enter them into a daily planner to remind you.  This will help you establish a routine.  2)  Organize your environment. Organize your environment so that it is uncluttered.  Decrease visual stimulation.  Place everyday items such as keys or cell phone in the same place every day (ie.  Basket next to front door) Use post it notes with a brief message to yourself (ie. Turn off light, lock the door) Use labels to indicate where things go (ie. Which cupboards are for food, dishes, etc.) Keep a notepad and pen by the  telephone to take messages  3)  Memory Aids A diary or journal/notebook/daily planner Making a list (shopping list, chore list, to do list that needs to be done) Using an alarm as a reminder (kitchen timer or cell phone alarm) Using cell phone to store information (Notes, Calendar, Reminders) Calendar/White board placed in a prominent position Post-it notes  In order for memory aids to be useful, you need to have good habits.  It's no good remembering to make a note in your journal if you don't remember to look in it.  Try setting aside a certain time of day to look in journal.  4)  Improving mood and managing fatigue. There may be other factors that contribute to memory difficulties.  Factors, such as anxiety, depression and tiredness can affect memory. Regular gentle exercise can help improve your mood and give you more energy. Simple relaxation techniques may help relieve symptoms of anxiety Try to get back to completing activities or hobbies you enjoyed doing in the past. Learn to pace yourself through activities to decrease fatigue. Find out about some local support groups where you can share experiences with others. Try and achieve 7-8 hours of sleep at night.  ASSESSMENT AND PLAN 58 y.o. year old female  here with:    1)   Jocelyn [name] has recently traveled to [place] in the company of a good friend and this friend has told her that she snores loudly and kept her awake at night.  In addition she witnessed apneic events.  Jocelyn [name] had been twice before checked for sleep apnea once around the year 2010 and once with us  here in 2020 each time the AHI was just borderline with an AHI of 5/h we usually do not initiate CPAP therapy.  But but there has been a weight gain of another 20 pounds or so since last being seen.   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    She reports that she does some minor errors when she does word finding searches or crossword puzzles,  this is new. Her father died at age 65 last 02-13-24 in a memory unit, and she is worried.    She is hyperreflexic without back problems and  this is unusual for a DM type 1.  She is on ZOLOFT .    Plan:  HST repeat with watch pat.  RV in any case , if HST is positive for sleep or not , in 4 months with memory testing by [facility]. ATN if there is a decreased score noted.  Discussed the genetic risk and biomarker  evidence.    I plan to follow up either personally or through our NP within 6 months.

## 2023-12-03 ENCOUNTER — Other Ambulatory Visit: Payer: Self-pay | Admitting: Internal Medicine

## 2023-12-04 MED ORDER — VERAPAMIL HCL ER 120 MG PO TBCR: 120.0000 mg | EXTENDED_RELEASE_TABLET | Freq: Every day | ORAL | 2 refills | Status: AC

## 2023-12-17 ENCOUNTER — Encounter: Payer: Self-pay | Admitting: Cardiology

## 2024-01-05 ENCOUNTER — Ambulatory Visit: Admitting: Neurology

## 2024-01-05 DIAGNOSIS — Z86711 Personal history of pulmonary embolism: Secondary | ICD-10-CM

## 2024-01-05 DIAGNOSIS — G909 Disorder of the autonomic nervous system, unspecified: Secondary | ICD-10-CM

## 2024-01-05 DIAGNOSIS — I479 Paroxysmal tachycardia, unspecified: Secondary | ICD-10-CM

## 2024-01-05 DIAGNOSIS — R0683 Snoring: Secondary | ICD-10-CM

## 2024-01-05 DIAGNOSIS — R0602 Shortness of breath: Secondary | ICD-10-CM

## 2024-01-05 DIAGNOSIS — Z9641 Presence of insulin pump (external) (internal): Secondary | ICD-10-CM

## 2024-01-05 DIAGNOSIS — G4733 Obstructive sleep apnea (adult) (pediatric): Secondary | ICD-10-CM

## 2024-01-05 DIAGNOSIS — R0681 Apnea, not elsewhere classified: Secondary | ICD-10-CM

## 2024-01-06 NOTE — Progress Notes (Signed)
 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 In addition she witnessed apneic events. Jocelyn [name] had been twice before checked for sleep apnea once around the year 2010 and once with Dr Bull in 2020 each time the AHI was just borderline with an AHI of 5/h we usually do not initiate CPAP therapy. But but there has been a weight gain of another 20 pounds or so since last being seen.  Also known to be a sleep talker.  She feels refreshed and restored in AM, wears a dental retainer,  takes power naps in daytime. Struggles with weight loss.  She also has an insulin  pump, DM type 1.  Had pulmonary emboli while on hormone medication.  She had  high TSH levels in 2016, no newer labs in Epic.  Father had AD. HST repeat with watch pat.  RV in any case , if HST is positive for sleep or not , in 4 months with memory testing by [facility]. ATN if there is a decreased score noted.  Discussed the genetic risk and biomarker  evidence.     Epworth sleepiness score:  4/ 24 points   FSS endorsed at 18/ 63 points.  GDS : 0/15   BMI: 33 kg/m   Neck Circumference:  15.75'' ,       Sleep Summary:   Total Recording Time (hours, min):  6 h 48 m      Total Sleep Time (hours, min):     5 h 29 m            Percent REM (%):  23%                                      Respiratory Indices:   Calculated pAHI (per hour):      43/h                        REM pAHI:   48.6/h                                                NREM pAHI:  41.6 /h                            Positional AHI:      supine sleep AHI was 60/h ( 59.7) , non supine sleep was 10.5/h and consisted left  and prone  sleep .  Oxygen Saturation Statistics:   Oxygen Saturation (%) Mean:    92%            O2 Saturation Range (%):    The saturation nadir was 76% and max saturation 97% .  There were 20 minutes of O2 Saturation (minutes) <89%, and 32 minutes of  hypoxia < 90% .           Pulse Rate Statistics:   Pulse Mean (bpm):  85 bpm               Pulse Range: from 69 to 103 bpm. Caveat: the watch pat device does not provide data of cardiac rhythm.     Snoring : almost 2/3 of total sleep time were associated with moderate - loud snoring ( going by the mean volume of 44 dB)            IMPRESSION:  This HST confirms the presence of  severe, all obstructive sleep apnea and associated severe sleep hypoxia. While REM versus NREM sleep didn't influence the apnea severity very much, the sleep position did. Supine sleep was associated a with much higher AHI data.       RECOMMENDATION: Clinically significant sleep hypoxia in severe sleep apnea needs to be treated by Positive Airway Pressure.  Neither dental devices nor hypoglossal tongue stimulator will address hypoxia.   I would like to obtain an in lab titration for this patient, given her pulmonary embolism history and the rise of sleep apnea by AHI from 5 to 40 plus an hour.   If her health insurance does not want to cover an in lab titration, we will use instead an autotitration CPAP device with a setting from 6 through 18 cm water, 2 cm EPR and heated humidification, mask of patients choice and comfort.     Avoiding the supine sleep position will reduce the apnea severity, please try to implement positional changes.    Weight loss is recommended, see additional information below.  While  FDA approved the  use of Zepbound for  patient's with moderate or severe OSA and the main risk factor of obesity,  many health insurances will still not cover the medication.     Any CPAP patient should be reminded to be fully compliant with PAP therapy , (defined as using PAP therapy for more than 4 hours each night ) with the goal to improve sleep related symptoms and decrease long term cardiovascular risks. Any PAP therapy patient should be reminded, that it may take up to 3 months to get fully used to using PAP and it may take 1-2 weeks for an established CPAP user to acclimatize to changes in pressure or mask. The earlier full compliance is achieved, the better long term compliance tends to be.   Please note that untreated obstructive sleep apnea may carry additional perioperative morbidity. Patients with significant obstructive sleep apnea should receive perioperative PAP therapy and the surgical team should be informed of the diagnosis and degree of sleep disordered breathing.  Sleep fragmentation in the presence of normal proportional sleep stages is a nonspecific findings and per se does not signify an intrinsic sleep disorder or a cause for the patient's sleep-related symptoms.  Causes include (but are not limited to) the unfamiliarity of sleeping while recorded by HST device or sleeping in a sleep lab for a full Polysomnography sleep study, but also circadian rhythm disturbances, medication side effects or an underlying mood disorder or medical problem.     INTERPRETING  PHYSICIAN:   Dedra Gores, MD

## 2024-01-11 ENCOUNTER — Ambulatory Visit: Payer: Self-pay | Admitting: Neurology

## 2024-01-11 DIAGNOSIS — E109 Type 1 diabetes mellitus without complications: Secondary | ICD-10-CM

## 2024-01-11 DIAGNOSIS — R0683 Snoring: Secondary | ICD-10-CM

## 2024-01-11 DIAGNOSIS — G4733 Obstructive sleep apnea (adult) (pediatric): Secondary | ICD-10-CM | POA: Insufficient documentation

## 2024-01-11 DIAGNOSIS — Z86711 Personal history of pulmonary embolism: Secondary | ICD-10-CM

## 2024-01-11 DIAGNOSIS — G909 Disorder of the autonomic nervous system, unspecified: Secondary | ICD-10-CM

## 2024-01-11 DIAGNOSIS — I479 Paroxysmal tachycardia, unspecified: Secondary | ICD-10-CM

## 2024-01-11 NOTE — Procedures (Signed)
 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 In addition she witnessed apneic events. Jocelyn [name] had been twice before checked for sleep apnea once around the year 2010 and once with Dr Bull in 2020 each time the AHI was just borderline with an AHI of 5/h we usually do not initiate CPAP therapy. But but there has been a weight gain of another 20 pounds or so since last being seen.  Also known to be a sleep talker.  She feels refreshed and restored in AM, wears a dental retainer,  takes power naps in daytime. Struggles with weight loss.  She also has an insulin  pump, DM type 1.  Had pulmonary emboli while on hormone medication.  She had  high TSH levels in 2016, no newer labs in Epic.  Father had AD. HST repeat with watch pat.  RV in any case , if HST is positive for sleep or not , in 4 months with memory testing by [facility]. ATN if there is a decreased score noted.  Discussed the genetic risk and biomarker  evidence.     Epworth sleepiness score:  4/ 24 points   FSS endorsed at 18/ 63 points.  GDS : 0/15   BMI: 33 kg/m   Neck Circumference:  15.75'' ,       Sleep Summary:   Total Recording Time (hours, min):  6 h 48 m      Total Sleep Time (hours, min):     5 h 29 m            Percent REM (%):  23%                                      Respiratory Indices:   Calculated pAHI (per hour):      43/h                        REM pAHI:   48.6/h                                                NREM pAHI:  41.6 /h                            Positional AHI:      supine sleep AHI was 60/h ( 59.7) , non supine sleep was 10.5/h and consisted left  and prone  sleep .  Oxygen Saturation Statistics:   Oxygen Saturation (%) Mean:    92%            O2 Saturation Range (%):    The saturation nadir was 76% and max saturation 97% .  There were 20 minutes of O2 Saturation (minutes) <89%, and 32 minutes of  hypoxia < 90% .           Pulse Rate Statistics:   Pulse Mean (bpm):  85 bpm               Pulse Range: from 69 to 103 bpm. Caveat: the watch pat device does not provide data of cardiac rhythm.     Snoring : almost 2/3 of total sleep time were associated with moderate - loud snoring ( going by the mean volume of 44 dB)            IMPRESSION:  This HST confirms the presence of  severe, all obstructive sleep apnea and associated severe sleep hypoxia. While REM versus NREM sleep didn't influence the apnea severity very much, the sleep position did. Supine sleep was associated a with much higher AHI data.       RECOMMENDATION: Clinically significant sleep hypoxia in severe sleep apnea needs to be treated by Positive Airway Pressure.  Neither dental devices nor hypoglossal tongue stimulator will address hypoxia.   I would like to obtain an in lab titration for this patient, given her pulmonary embolism history and the rise of sleep apnea by AHI from 5 to 40 plus an hour.   If her health insurance does not want to cover an in lab titration, we will use instead an autotitration CPAP device with a setting from 6 through 18 cm water, 2 cm EPR and heated humidification, mask of patients choice and comfort.     Avoiding the supine sleep position will reduce the apnea severity, please try to implement positional changes.    Weight loss is recommended, see additional information below.  While  FDA approved the  use of Zepbound for  patient's with moderate or severe OSA and the main risk factor of obesity,  many health insurances will still not cover the medication.     Any CPAP patient should be reminded to be fully compliant with PAP therapy , (defined as using PAP therapy for more than 4 hours each night ) with the goal to improve sleep related symptoms and decrease long term cardiovascular risks. Any PAP therapy patient should be reminded, that it may take up to 3 months to get fully used to using PAP and it may take 1-2 weeks for an established CPAP user to acclimatize to changes in pressure or mask. The earlier full compliance is achieved, the better long term compliance tends to be.   Please note that untreated obstructive sleep apnea may carry additional perioperative morbidity. Patients with significant obstructive sleep apnea should receive perioperative PAP therapy and the surgical team should be informed of the diagnosis and degree of sleep disordered breathing.  Sleep fragmentation in the presence of normal proportional sleep stages is a nonspecific findings and per se does not signify an intrinsic sleep disorder or a cause for the patient's sleep-related symptoms.  Causes include (but are not limited to) the unfamiliarity of sleeping while recorded by HST device or sleeping in a sleep lab for a full Polysomnography sleep study, but also circadian rhythm disturbances, medication side effects or an underlying mood disorder or medical problem.     INTERPRETING  PHYSICIAN:   Dedra Gores, MD

## 2024-01-14 NOTE — Telephone Encounter (Signed)
-----   Message from Anita Dohmeier sent at 01/11/2024  5:36 PM EDT ----- Calculated pAHI (per hour):      43/h    The saturation nadir was 76% and max saturation 97% .  There were 20 minutes of O2 Saturation (minutes) <89%, and 32 minutes of  hypoxia < 90% .  This HST confirms the presence of  severe, all obstructive sleep apnea and associated severe sleep hypoxia. While REM versus NREM sleep didn't influence the apnea severity very much, the sleep position did. Supine sleep was associated a with much higher AHI data.   Clinically significant sleep hypoxia in severe sleep apnea needs to be treated by Positive Airway Pressure.  Neither dental devices nor hypoglossal tongue stimulator will address hypoxia.    I would like to obtain an in lab titration for this patient, given her pulmonary embolism history and the rise of sleep apnea by AHI from 5 to 40 plus an hour.    If her health insurance does not want to cover an in lab titration, we will use instead an autotitration CPAP device with a setting from 6 through 18 cm water, 2 cm EPR and heated humidification,  mask of patients choice and comfort.     Avoiding the supine sleep position will reduce the apnea severity, please try to implement positional changes.    ----- Message ----- From: Chalice Saunas, MD Sent: 01/11/2024   5:30 PM EDT To: Saunas Chalice, MD

## 2024-01-14 NOTE — Telephone Encounter (Signed)
 I called and relayed the results of the HST per below.  Due to severity of OSA and associated severe hypoxia she would like to get a inlab cpap titration study if they do not approve then will move forward with autopap machine.  Pt did verbalize understanding.  She appreciated callback.

## 2024-01-19 ENCOUNTER — Encounter: Payer: Self-pay | Admitting: Neurology

## 2024-02-19 NOTE — Progress Notes (Unsigned)
 Cardiology Clinic Note   Patient Name: Jocelyn [name] Date of Encounter: 02/19/2024  Primary Care Provider:  Tisovec, Richard W, MD Primary Cardiologist:  None  Patient Profile    Jocelyn [name] 58 year old female presents to the clinic today for follow-up evaluation of her dysautonomia.  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   Carpal tunnel syndrome, bilateral 06/02/2016   Chest pain    RECURRENT WITH NEGITIVE CATHERIZATIONS   Complication of anesthesia    aspirated and gained 15pounds, ended up in ICU   DM (diabetes mellitus) (HCC)    Dyslipidemia    Fatty liver    GERD (gastroesophageal reflux disease)    History of CT scan    Coronary CTA 3/17: Calcium  score 0; no plaque or stenosis noted in coronary arteries   Hypercholesteremia    Hypertension    Migraine    Obesity    Pulmonary embolus (HCC) 2002   Tachycardia    A fib   Past Surgical History:  Procedure Laterality Date   CARDIAC CATHETERIZATION     x2   CARPAL TUNNEL RELEASE     right   CHOLECYSTECTOMY  03/24/2012   Procedure: LAPAROSCOPIC CHOLECYSTECTOMY WITH INTRAOPERATIVE CHOLANGIOGRAM;  Surgeon: Morene ONEIDA Olives, MD;  Location: [facility];  Service: General;  Laterality: N/A;   ESOPHAGEAL MANOMETRY N/A 05/28/2015   Procedure: ESOPHAGEAL MANOMETRY (EM);  Surgeon: Norleen LOISE Kiang, MD;  Location: [facility];  Service: Gastroenterology;  Laterality: N/A;   FIBROID TUMOR     UTERUS SURGERY   LAPAROSCOPIC HYSTERECTOMY  2012    Allergies  Allergies  Allergen Reactions   Pumpkin Flavoring Agent (Non-Screening) Anaphylaxis    Squash also   Salmon [Fish Allergy] Anaphylaxis   Crestor [Rosuvastatin Calcium ] Other (See Comments)    Muscle pain   Lipitor [Atorvastatin ] Other (See Comments)    Muscle pain   Sulfadoxine    Sulfonamide Derivatives Other (See Comments)    REACTION: GI distress   Morphine  And Codeine Rash     History of Present Illness    Jocelyn [name] has a PMH of anxiety, chest pain (recurrent with negative catheterizations), diabetes, dyslipidemia, GERD, coronary CTA 3/17 with coronary calcium  score of 0, HTN, pulmonary nodules, and tachycardia.  She was seen in follow-up by Dr. Waddell on 03/24/2023.  She reported that she had done well but was noticing more stress with the loss of her job.  She had minimal chest pain.  She denied shortness of breath.  She reported that she was exercising.  Her A1c was noted to be 7.6.  She denied syncope.  She described fleeting chest pain which was noncardiac but appeared to be related to cold weather.  It was also felt that her symptoms may be related to reflux or spasm.  Her dysautonomia was well-controlled on verapamil .  Her EKG showed normal sinus rhythm.  Her statin therapy was continued.  She presents to the clinic today for follow-up evaluation and states***.  *** denies chest pain, shortness of breath, lower extremity edema, fatigue, palpitations, melena, hematuria, hemoptysis, diaphoresis, weakness, presyncope, syncope, orthopnea, and PND.  Dysautonomia-EKG today shows***.  Denies recent episodes of accelerated heart rate, lightheadedness, presyncope or syncope. Maintain p.o. hydration Maintain sodium intake Continue verapamil  Continue physical activity  Chest discomfort-no chest pain today.  Denies exertional chest discomfort.  Previously felt to be related to  reflux or muscle spasm. No plans for ischemic evaluation Heart healthy diet Continue lisinopril, verapamil   Hyperlipidemia-LDL***. Maintain physical activity High-fiber diet Continue aspirin , pitavastatin, ezetimibe   Obesity-weight today***. Reduced calorie diet Maintain physical activity Maintain weight log  Disposition: Follow-up with Dr. Waddell or me in 9-12 months.  Home Medications    Prior to Admission medications   Medication Sig Start Date End Date Taking?  Authorizing Provider  Ascorbic Acid (VITAMIN C) 500 MG tablet Take 500 mg by mouth daily.    [provider]  aspirin  81 MG tablet Take 81 mg by mouth at bedtime.    [provider]  Calcium  Carbonate (CALCIUM  500 PO) Take 1,000 mg by mouth daily.    [provider]  cholecalciferol (VITAMIN D ) 1000 units tablet Take 1,000 Units by mouth daily.    [provider]  Coenzyme Q10 (CO Q 10) 100 MG CAPS Take 100 mg by mouth daily.     [provider]  famotidine  (PEPCID ) 40 MG tablet TAKE 1 TABLET BY MOUTH TWICE A DAY 09/08/23   Kennedy-Smith, Colleen M, NP  gabapentin (NEURONTIN) 100 MG capsule Take 1 capsule by mouth daily. 03/10/16   [provider]  Insulin  Infusion Pump KIT as directed.    [provider]  MAGNESIUM PO Take 1 tablet by mouth daily.     [provider]  Multiple Vitamin (MULTIVITAMIN) tablet Take 1 tablet by mouth daily.    [provider]  nitroGLYCERIN  (NITROSTAT ) 0.4 MG SL tablet PLACE 1 TABLET (0.4 MG TOTAL) UNDER THE TONGUE EVERY 5 (FIVE) MINUTES AS NEEDED FOR CHEST PAIN. 01/10/19   Waddell Danelle ORN, MD  NOVOLOG 100 UNIT/ML injection as directed. 03/10/16   [provider]  pantoprazole  (PROTONIX ) 40 MG tablet Take 1 tablet (40 mg total) by mouth 2 (two) times daily. Take 30 minutes before breakfast and dinner. 05/22/23   Kennedy-Smith, Colleen M, NP  Pitavastatin Calcium  2 MG TABS Take 2 mg by mouth at bedtime.     [provider]  ramipril  (ALTACE ) 2.5 MG capsule Take 2.5 mg by mouth at bedtime.    [provider]  sertraline  (ZOLOFT ) 100 MG tablet Take 100 mg by mouth daily before breakfast. Pt also take 25 mg of Zoloft  at night    [provider]  verapamil  (CALAN ) 40 MG tablet Take 1 tablet (40 mg total) by mouth daily as needed. 09/12/21   Waddell Danelle ORN, MD  verapamil  (CALAN -SR) 120 MG CR tablet Take 1 tablet (120 mg total) by mouth at bedtime. 12/04/23   Waddell Danelle ORN, MD  ZETIA  10 MG tablet TAKE 1 TAB (10 mg) BY MOUTH ONCE DAILY AT BEDTIME 10/12/14   [provider]    Family History    Family History  Problem Relation Age of Onset   Gallbladder disease Mother    Arrhythmia Mother    Irritable bowel syndrome Father    Cancer Maternal Grandmother        ovarian   Heart disease Maternal Grandfather    Heart disease Paternal Grandfather    Diabetes Paternal Grandfather    Gallbladder disease Other        3 mat. uncles   Colon cancer Neg Hx    Esophageal cancer Neg Hx    Pancreatic cancer Neg Hx    Stomach cancer Neg Hx    She indicated that her mother is alive. She indicated that her father is alive. She indicated that her maternal  grandmother is deceased. She indicated that her maternal grandfather is deceased. She indicated that her paternal grandmother is deceased. She indicated that her paternal grandfather is deceased. She indicated that the status of her neg hx is unknown. She indicated that the status of her other is unknown.  Social History    Social History   Socioeconomic History   Marital status: Single    Spouse name: Not on file   Number of children: 0   Years of education: Post-grad   Highest education level: Not on file  Occupational History   Occupation: registered dietician    Comment: [employer]  Tobacco Use   Smoking status: Never   Smokeless tobacco: Never  Vaping Use   Vaping status: Never Used  Substance and Sexual Activity   Alcohol use: Yes    Comment: social drinker   Drug use: No   Sexual activity: Not on file  Other Topics Concern   Not on file  Social History Narrative   Lives at home alone   Right-handed   Drinks 1 cup caffeine daily   Social Drivers of Corporate Investment Banker Strain: Not on file  Food Insecurity: Not on file  Transportation Needs: Not on file  Physical Activity: Not on file  Stress: Not on file  Social Connections: Unknown (08/27/2021)    Received from [facility]   Social Network    Social Network: Not on file  Intimate Partner Violence: Unknown (07/19/2021)   Received from [facility]   HITS    Physically Hurt: Not on file    Insult or Talk Down To: Not on file    Threaten Physical Harm: Not on file    Scream or Curse: Not on file     Review of Systems    General:  No chills, fever, night sweats or weight changes.  Cardiovascular:  No chest pain, dyspnea on exertion, edema, orthopnea, palpitations, paroxysmal nocturnal dyspnea. Dermatological: No rash, lesions/masses Respiratory: No cough, dyspnea Urologic: No hematuria, dysuria Abdominal:   No nausea, vomiting, diarrhea, bright red blood per rectum, melena, or hematemesis Neurologic:  No visual changes, wkns, changes in mental status. All other systems reviewed and are otherwise negative except as noted above.  Physical Exam    VS:  There were no vitals taken for this visit. , BMI There is no height or weight on file to calculate BMI. GEN: Well nourished, well developed, in no acute distress. HEENT: normal. Neck: Supple, no JVD, carotid bruits, or masses. Cardiac: RRR, no murmurs, rubs, or gallops. No clubbing, cyanosis, edema.  Radials/DP/PT 2+ and equal bilaterally.  Respiratory:  Respirations regular and unlabored, clear to auscultation bilaterally. GI: Soft, nontender, nondistended, BS + x 4. MS: no deformity or atrophy. Skin: warm and dry, no rash. Neuro:  Strength and sensation are intact. Psych: Normal affect.  Accessory Clinical Findings    Recent Labs: No results found for requested labs within last 365 days.   Recent Lipid Panel    Component Value Date/Time   CHOL 107 04/05/2015 0850   TRIG 101 04/05/2015 0850   HDL 54 04/05/2015 0850   CHOLHDL 2.0 04/05/2015 0850   VLDL 20 04/05/2015 0850   LDLCALC 33 04/05/2015 0850    No BP recorded.  {Refresh Note OR Click here to enter BP  :1}***    ECG personally reviewed by me today- ***     Echocardiogram 04/05/2015  ---  LV EF: 55% -   60%   -------------------------------------------------------------------  Indications:     Chest pain 786.51.   -------------------------------------------------------------------  History:  PMH:   Tachycardia and dyspnea.  Risk factors:  Hypertension. Dyslipidemia.   -------------------------------------------------------------------  Study Conclusions   - Left ventricle: The cavity size was normal. Wall thickness was    normal. Systolic function was normal. The estimated ejection    fraction was in the range of 55% to 60%. Wall motion was normal;    there were no regional wall motion abnormalities. Doppler    parameters are consistent with abnormal left ventricular    relaxation (grade 1 diastolic dysfunction).   Impressions:   - Normal LV function; grade 1 diastolic dysfunction; trace MR and    TR.   Transthoracic echocardiography.  M-mode, complete 2D, spectral  Doppler, and color Doppler.  Birthdate:  Patient birthdate:  07-Jul-1965.  Age:  Patient is 58 yr old.  Sex:  Gender: female.  BMI: 30.2 kg/m^2.  Blood pressure:     108/60  Patient status:  Inpatient.  Study date:  Study date: 04/05/2015. Study time: 03:15  PM.  Location:  Bedside.   -------------------------------------------------------------------   -------------------------------------------------------------------  Left ventricle:  The cavity size was normal. Wall thickness was  normal. Systolic function was normal. The estimated ejection  fraction was in the range of 55% to 60%. Wall motion was normal;  there were no regional wall motion abnormalities. Doppler  parameters are consistent with abnormal left ventricular relaxation  (grade 1 diastolic dysfunction).   -------------------------------------------------------------------  Aortic valve:   Trileaflet; normal thickness leaflets. Mobility was  not restricted.  Doppler:  Transvalvular velocity was  within the  normal range. There was no stenosis. There was no regurgitation.    -------------------------------------------------------------------  Aorta: Aortic root: The aortic root was normal in size.   -------------------------------------------------------------------  Mitral valve:   Structurally normal valve.   Mobility was not  restricted.  Doppler:  Transvalvular velocity was within the normal  range. There was no evidence for stenosis. There was trivial  regurgitation.    Peak gradient (D): 3 mm Hg.   -------------------------------------------------------------------  Left atrium:  The atrium was normal in size.   -------------------------------------------------------------------  Right ventricle:  The cavity size was normal. Systolic function was  normal.   -------------------------------------------------------------------  Pulmonic valve:    Doppler:  Transvalvular velocity was within the  normal range. There was no evidence for stenosis. There was trivial  regurgitation.   -------------------------------------------------------------------  Tricuspid valve:   Structurally normal valve.    Doppler:  Transvalvular velocity was within the normal range. There was  trivial regurgitation.   -------------------------------------------------------------------  Right atrium:  The atrium was normal in size.   -------------------------------------------------------------------  Pericardium: There was no pericardial effusion.   -------------------------------------------------------------------  Systemic veins:  Inferior vena cava: The vessel was normal in size.       Assessment & Plan   1.  ***   Jocelyn HERO. Esmay Amspacher NP-C     02/19/2024, 7:38 AM [facility] [address] [place], [place] [address] Office [phone]    Notice: This dictation was prepared with Dragon dictation along with smaller phrase technology. Any  transcriptional errors that result from this process are unintentional and may not be corrected upon review.   I spent***minutes examining this patient, reviewing medications, and using patient centered shared decision making involving their cardiac care.   I spent  20 minutes reviewing past medical history,  medications, and prior cardiac tests.

## 2024-02-21 ENCOUNTER — Ambulatory Visit (INDEPENDENT_AMBULATORY_CARE_PROVIDER_SITE_OTHER): Admitting: Neurology

## 2024-02-21 DIAGNOSIS — I479 Paroxysmal tachycardia, unspecified: Secondary | ICD-10-CM

## 2024-02-21 DIAGNOSIS — Z86711 Personal history of pulmonary embolism: Secondary | ICD-10-CM

## 2024-02-21 DIAGNOSIS — G909 Disorder of the autonomic nervous system, unspecified: Secondary | ICD-10-CM

## 2024-02-21 DIAGNOSIS — G4733 Obstructive sleep apnea (adult) (pediatric): Secondary | ICD-10-CM | POA: Diagnosis not present

## 2024-02-21 DIAGNOSIS — E109 Type 1 diabetes mellitus without complications: Secondary | ICD-10-CM

## 2024-02-21 DIAGNOSIS — R0683 Snoring: Secondary | ICD-10-CM

## 2024-02-22 ENCOUNTER — Encounter: Payer: Self-pay | Admitting: General Practice

## 2024-02-22 ENCOUNTER — Ambulatory Visit: Payer: Self-pay | Attending: General Practice | Admitting: General Practice

## 2024-02-22 VITALS — BP 116/58 | HR 83 | Ht 62.0 in | Wt 185.0 lb

## 2024-02-22 DIAGNOSIS — G901 Familial dysautonomia [Riley-Day]: Secondary | ICD-10-CM | POA: Diagnosis not present

## 2024-02-22 DIAGNOSIS — I10 Essential (primary) hypertension: Secondary | ICD-10-CM | POA: Diagnosis not present

## 2024-02-22 DIAGNOSIS — R0789 Other chest pain: Secondary | ICD-10-CM | POA: Diagnosis not present

## 2024-02-22 DIAGNOSIS — I479 Paroxysmal tachycardia, unspecified: Secondary | ICD-10-CM

## 2024-02-22 MED ORDER — VERAPAMIL HCL 40 MG PO TABS: 40.0000 mg | ORAL_TABLET | Freq: Every day | ORAL | 1 refills | Status: AC | PRN

## 2024-02-22 NOTE — Patient Instructions (Signed)
 Medication Instructions:  Your physician recommends that you continue on your current medications as directed. Please refer to the Current Medication list given to you today. *If you need a refill on your cardiac medications before your next appointment, please call your pharmacy*  Lab Work: None ordered If you have labs (blood work) drawn today and your tests are completely normal, you will receive your results only by: MyChart Message (if you have MyChart) OR A paper copy in the mail If you have any lab test that is abnormal or we need to change your treatment, we will call you to review the results.  Testing/Procedures: None ordered  Follow-Up: At [facility], you and your health needs are our priority.  As part of our continuing mission to provide you with exceptional heart care, our providers are all part of one team.  This team includes your primary Cardiologist (physician) and Advanced Practice Providers or APPs (Physician Assistants and Nurse Practitioners) who all work together to provide you with the care you need, when you need it.  Your next appointment:   12 month(s)  Provider:   Soyla DELENA Merck, MD or Josefa Beauvais, NP   We recommend signing up for the patient portal called MyChart.  Sign up information is provided on this After Visit Summary.  MyChart is used to connect with patients for Virtual Visits (Telemedicine).  Patients are able to view lab/test results, encounter notes, upcoming appointments, etc.  Non-urgent messages can be sent to your provider as well.   To learn more about what you can do with MyChart, go to [email or web address].   Other Instructions Exercise regularly as told by your doctor. Make sure to weight daily and keep a weight log.   Moderate-intensity exercise is any activity that gets you moving enough to burn at least three times more energy (calories) than if you were sitting. Examples of moderate exercise include: Walking a  mile in 15 minutes. Doing light yard work. Biking at an easy pace. Most people should get at least 150 minutes of moderate-intensity exercise a week to maintain their body weight.  Increase your water intake: Maintain hydration

## 2024-02-26 ENCOUNTER — Ambulatory Visit: Payer: Self-pay | Admitting: Neurology

## 2024-02-26 ENCOUNTER — Encounter: Payer: Self-pay | Admitting: Neurology

## 2024-02-26 NOTE — Procedures (Signed)
 [facility] PAP TITRATION INTERPRETATION REPORT   STUDY DATE: 02/21/2024      PATIENT NAME:  Jocelyn [name]         DATE OF BIRTH:  1965-11-04  PATIENT ID:  [id number]    TYPE OF STUDY:  CPAP  READING PHYSICIAN: DEDRA GORES, MD SCORING TECHNICIAN: Jesusa Haddock, RPSGT   HISTORY: Mrs. Macha had recently traveled to [place] in the company of a close friend who told her that she snores loudly and kept her awake at night, she witnessed apneic events too. Mrs. Amer had been twice before checked for sleep apnea ; once around the year 2010 and once with us  here in 2020. each time the AHI was just borderline with an AHI of 5/h we usually do not initiate CPAP therapy. But there has been a weight gain of about 20 pounds since last being seen. 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 She reports that she does some minor errors when she does word finding searches or crossword puzzles, this is according to her report a new symptom. Her father died at age 31 last 01/31/2024 in a memory unit, and she is worried. She is hyperreflexic without back problems - this is unusual for a DM type 1. She may have small vessel disease. She is on ZOLOFT  and still grieving. She has suffered a PE, has autonomic dysfunction, DM typ1, is obese,and has been hypoxic  Her 01-05-2024 HST was positive for sleep apnea, now invited for in lab titration.  RV in 4-6 months with memory testing by [facility] if there is a decreased score noted. Discussed the genetic risk and biomarker evidence.The Epworth Sleepiness Scale was 4 out of 24 (scores above or equal to 10 are suggestive of hypersomnolence).  DESCRIPTION: A sleep technologist was in attendance for the duration of the recording.  Data collection, scoring, video monitoring, and reporting were performed in compliance with the AASM Manual for the Scoring of Sleep and Associated Events; (Hypopnea is scored based on the criteria  listed in Section VIII D. 1b in the AASM Manual V2.6 using a 4% oxygen desaturation rule or Hypopnea is scored based on the criteria listed in Section VIII D. 1a in the AASM Manual V2.6 using 3% oxygen desaturation and /or arousal rule).  A physician certified by the American Board of Sleep Medicine reviewed each epoch of the study.  ADDITIONAL INFORMATION:  Height: 62.0 in Weight: 179 lb (BMI 32) Neck Size: 15.8 in    MEDICATIONS: Vitamin C, Aspirin , Calcium , Vitamin D , Co Q 10, Pepcid , Neurontin, Insulin  Infusion Pump, Magnesium, Multivitamin, Nitrostat , Novolog, Protonix , Pitavastatin Calcium , Altace , Zoloft , Calan , Calan -SR, Zetia    SLEEP CONTINUITY AND SLEEP ARCHITECTURE:  Fitted with a ResMed N 20  in size small- NASAL MASK- Lights off was at 22:47: and lights on 05:12: (6.4 hours in bed).  Total sleep time was 313.5 minutes with a decreased sleep efficiency at 81.4%. Sleep latency was normal at 16.0 minutes.  There were 1 Stage R periods observed on this study night, 18 awakenings (i.e. transitions to Stage W from any sleep stage), and 62.0 total stage transitions. Wake after sleep onset (WASO) time accounted for 55 minutes.  AROUSAL: There were 49 arousals in total,  Of these, 7 were identified as respiratory-related arousals (1.3 /h), 0 were PLM-related arousals (0.0 /h), and 42 were non-specific arousals (8.0 /h)  RESPIRATORY MONITORING:  Based on CMS criteria (using a 4% oxygen desaturation rule for scoring  hypopneas), there were 0 apneas (0 obstructive; 0 central; 0 mixed), and 8 hypopneas.  Apnea index was 0.0. Hypopnea index was 1.5/h. The apnea-hypopnea index was 1.5/h overall (2.8 supine, 0.0 non-supine; 2.0 REM, 2.0 supine REM).   Based on AASM criteria (using a 3% oxygen desaturation and /or arousal rule for scoring hypopneas), there were 0 apneas (0 obstructive; 0 central; 0 mixed), and 21 hypopneas. Apnea index was 0.0. Hypopnea index was 4.0. The apnea-hypopnea index was 4.0 overall  (10.7 supine, 1.6/h in  non-supine; 10.1/h in  REM, and 2.6/h in NREM).  OXIMETRY: Total sleep time spent at, or below 88% was 0.3 minutes, or 0.1% of total sleep time. Respiratory events were associated with oxyhemoglobin desaturation to a nadir of 84% from a mean of 94%).  EKG: The average heart rate during sleep was 89 bpm.  The maximum heart rate during sleep was 102 bpm. The minimum heart rate during sleep recording was 78 bpm.  BODY POSITION: Duration of total sleep and percent of total sleep in their respective position is as follows: supine 84 minutes (27.0%), non-supine 229.0 minutes (73.0%); right 124 minutes (39.7%), left 104 minutes (33.3%), and prone 00 minutes (0.0%). Total supine REM sleep time was 59 minutes (100.0% of total REM sleep). LIMB MOVEMENTS: There were 0 periodic limb movements of sleep (0.0/h), of which 0 (0.0/h) were associated with an arousal.  IMPRESSION: CPAP initiated under the use of a nasal mask N20 by ResMed in small size allowed for titration from 5 cm water through 10 cm water pressure ,with the last explored pressure allowing  REM sleep rebound and a reduction to an AHI of 3.4/h .  Supine sleep was still associated with some break through apneas and hypopneas and should be avoided.  No clinically significant hypoxia was present.      RECOMMENDATIONS: Auto CPAP by ResMed , setting from 5 through 12 cm water, 2 cm EPR and heated humidification. N20 by ResMed in small size  Please use your CPAP machine for a minimum of 4 hours each night and avoid sleeping on your back as much as possible. Please inform the medical equipment company of any malfunction or discomfort as they replace machines or masks within 30 days at no costs.  Bring the CPAP machine and all attachments to your next visit  with us .       Mikal Blasdell,  MD            Pressure IPAP/EPAP 00 05 06 07 08 09 10   O2 Vol 0.0 0.0 0.0 0.0 0.0 0.0 0.0  Time TRT 3.27m 120.40m 108.35m 11.72m 51.62m  22.51m 69.22m   TST 0.71m 84.24m 95.48m 11.61m 48.28m 22.32m 52.27m  Sleep Stage % Wake 100.0 30.0 12.4 0.0 4.9 0.0 23.9   % REM 0.0 0.0 0.0 0.0 28.9 100.0 44.8   % N1 0.0 11.3 5.3 0.0 1.0 0.0 6.7   % N2 0.0 36.3 45.8 100.0 70.1 0.0 48.6   % N3 0.0 52.4 48.9 0.0 0.0 0.0 0.0  Respiratory Total Events 0 7 1 0 4 6 3    Obs. Apn. 0 0 0 0 0 0 0   Mixed Apn. 0 0 0 0 0 0 0   Cen. Apn. 0 0 0 0 0 0 0   Hypopneas 0 7 1 0 4 6 3    AHI 0.00 5.00 0.63 0.00 4.95 16.36 3.43   Supine AHI 0.00 20.00 0.00 0.00 8.73 16.36 7.66   Prone AHI 0.00  0.00 0.00 0.00 0.00 0.00 0.00   Side AHI 0.00 3.85 0.67 0.00 0.00 0.00 0.00  Respiratory (4%) Hypopneas (4%) 0.00 5.00 1.00 0.00 0.00 2.00 0.00   AHI (4%) 0.00 3.57 0.63 0.00 0.00 5.45 0.00   Supine AHI (4%) 0.00 20.00 0.00 0.00 0.00 5.45 0.00   Prone AHI (4%) 0.00 0.00 0.00 0.00 0.00 0.00 0.00   Side AHI (4%) 0.00 2.31 0.67 0.00 0.00 0.00 0.00  Desat Profile <= 90% 0.10m 6.3m 1.55m 0.9m 0.30m 0.4m 0.41m   <= 80% 0.64m 5.34m 0.53m 0.23m 0.62m 0.24m 0.40m   <= 70% 0.48m 5.70m 0.44m 0.84m 0.74m 0.16m 0.102m   <= 60% 0.63m 5.26m 0.60m 0.33m 0.4m 0.71m 0.59m  Arousal Index Apnea 0.0 0.0 0.0 0.0 0.0 0.0 0.0   Hypopnea 0.0 3.6 0.6 0.0 0.0 2.7 0.0   LM 0.0 0.0 0.0 0.0 0.0 0.0 0.0   Spontaneous 0.0 13.6 5.7 0.0 8.7 0.0 8.0

## 2024-02-29 NOTE — Telephone Encounter (Signed)
 RE: new autopap user Received: Today Zott, Glade Salt, Nena RAMAN, RN; Lawson, Tammy; Zott, [place]; Salick, Verneita Got it.     Previous Messages    ----- Message ----- From: Salt Nena RAMAN, RN Sent: 02/29/2024   9:04 AM EST To: Madelin Donnice Verneita Darrel; Glade Zott Subject: new autopap user                              Good Morning,  New order in epic for new autopap user.  Jocelyn [name] Female, 58 y.o., 09/28/1965 Pronouns: she/her/hers MRN: [id number]  [name]

## 2024-03-16 NOTE — Telephone Encounter (Signed)
 Jocelyn [name]

## 2024-03-19 MED ORDER — AZELASTINE-FLUTICASONE 137-50 MCG/ACT NA SUSP: 1.0000 | Freq: Every evening | NASAL | 0 refills | Status: AC

## 2024-03-19 NOTE — Addendum Note (Signed)
 Addended by: CHALICE SAUNAS on: 03/19/2024 04:29 PM   Modules accepted: Orders

## 2024-04-03 ENCOUNTER — Other Ambulatory Visit: Payer: Self-pay | Admitting: Nurse Practitioner

## 2024-04-13 ENCOUNTER — Telehealth: Payer: Self-pay | Admitting: Neurology

## 2024-04-13 NOTE — Telephone Encounter (Signed)
 Patient called to make a CPAP follow-up appointment with Dr. Chalice. Patient will be out of town the last week of January for work and is scheduled for 05/16/24 at 8:30 using the work-in slot. Patient has been using her machine since end of November.

## 2024-04-13 NOTE — Telephone Encounter (Signed)
 At 4:37 yesterday afternoon pt left a vm asking to be called to schedule her initial CPAP f/u.  Phone rep called pt back, after checking DPR a detailed message was left asking pt to call to schedule initial CPAP f/u, office hours left on vm

## 2024-05-02 ENCOUNTER — Ambulatory Visit: Payer: Self-pay | Admitting: Neurology

## 2024-05-12 NOTE — Progress Notes (Unsigned)
 Jocelyn [name]

## 2024-05-13 ENCOUNTER — Telehealth: Payer: Self-pay | Admitting: Neurology

## 2024-05-13 NOTE — Telephone Encounter (Signed)
 LVM and sent MyChart msg informing pt of r/s needed for 2/2- office opening late.

## 2024-05-16 ENCOUNTER — Ambulatory Visit: Admitting: Neurology

## 2024-05-23 ENCOUNTER — Ambulatory Visit: Admitting: Neurology
